# Patient Record
Sex: Female | Born: 1935 | Race: White | Hispanic: No | Smoking: Never smoker
Health system: Southern US, Community
[De-identification: ages and names within clinical notes are randomized; demographics above are authoritative.]

## PROBLEM LIST (undated history)

## (undated) DIAGNOSIS — Z8719 Personal history of other diseases of the digestive system: Secondary | ICD-10-CM

## (undated) DIAGNOSIS — J302 Other seasonal allergic rhinitis: Secondary | ICD-10-CM

## (undated) DIAGNOSIS — F32A Depression, unspecified: Secondary | ICD-10-CM

## (undated) DIAGNOSIS — I10 Essential (primary) hypertension: Secondary | ICD-10-CM

## (undated) DIAGNOSIS — C649 Malignant neoplasm of unspecified kidney, except renal pelvis: Secondary | ICD-10-CM

## (undated) DIAGNOSIS — R519 Headache, unspecified: Secondary | ICD-10-CM

## (undated) DIAGNOSIS — E119 Type 2 diabetes mellitus without complications: Secondary | ICD-10-CM

## (undated) DIAGNOSIS — M1990 Unspecified osteoarthritis, unspecified site: Secondary | ICD-10-CM

## (undated) DIAGNOSIS — E7800 Pure hypercholesterolemia, unspecified: Secondary | ICD-10-CM

## (undated) DIAGNOSIS — R51 Headache: Secondary | ICD-10-CM

## (undated) DIAGNOSIS — I669 Occlusion and stenosis of unspecified cerebral artery: Secondary | ICD-10-CM

## (undated) DIAGNOSIS — F329 Major depressive disorder, single episode, unspecified: Secondary | ICD-10-CM

## (undated) DIAGNOSIS — D649 Anemia, unspecified: Secondary | ICD-10-CM

## (undated) DIAGNOSIS — Z972 Presence of dental prosthetic device (complete) (partial): Secondary | ICD-10-CM

## (undated) DIAGNOSIS — G459 Transient cerebral ischemic attack, unspecified: Secondary | ICD-10-CM

## (undated) HISTORY — PX: BREAST CYST ASPIRATION: SHX578

## (undated) HISTORY — PX: BREAST BIOPSY: SHX20

## (undated) HISTORY — PX: BRAIN SURGERY: SHX531

## (undated) HISTORY — PX: ABDOMINAL HYSTERECTOMY: SHX81

## (undated) HISTORY — PX: APPENDECTOMY: SHX54

## (undated) HISTORY — PX: CHOLECYSTECTOMY: SHX55

---

## 1998-09-21 DIAGNOSIS — G459 Transient cerebral ischemic attack, unspecified: Secondary | ICD-10-CM

## 1998-09-21 HISTORY — DX: Transient cerebral ischemic attack, unspecified: G45.9

## 2004-10-30 ENCOUNTER — Ambulatory Visit: Payer: Self-pay | Admitting: Family Medicine

## 2005-10-13 ENCOUNTER — Emergency Department: Payer: Self-pay | Admitting: Emergency Medicine

## 2006-01-26 ENCOUNTER — Other Ambulatory Visit: Payer: Self-pay

## 2006-01-26 ENCOUNTER — Emergency Department: Payer: Self-pay | Admitting: Emergency Medicine

## 2006-11-04 ENCOUNTER — Ambulatory Visit: Payer: Self-pay | Admitting: Urology

## 2007-05-08 ENCOUNTER — Emergency Department: Payer: Self-pay | Admitting: Unknown Physician Specialty

## 2007-05-08 ENCOUNTER — Other Ambulatory Visit: Payer: Self-pay

## 2007-07-13 ENCOUNTER — Ambulatory Visit: Payer: Self-pay | Admitting: Family Medicine

## 2007-07-18 ENCOUNTER — Other Ambulatory Visit: Payer: Self-pay

## 2007-07-18 ENCOUNTER — Emergency Department: Payer: Self-pay | Admitting: Emergency Medicine

## 2007-07-27 ENCOUNTER — Ambulatory Visit: Payer: Self-pay | Admitting: Psychiatry

## 2007-09-09 ENCOUNTER — Ambulatory Visit: Payer: Self-pay | Admitting: Urology

## 2008-03-05 ENCOUNTER — Inpatient Hospital Stay: Payer: Self-pay | Admitting: Specialist

## 2008-03-05 ENCOUNTER — Other Ambulatory Visit: Payer: Self-pay

## 2008-08-01 ENCOUNTER — Ambulatory Visit: Payer: Self-pay | Admitting: Family Medicine

## 2008-10-13 ENCOUNTER — Emergency Department: Payer: Self-pay | Admitting: Emergency Medicine

## 2009-01-21 ENCOUNTER — Emergency Department: Payer: Self-pay | Admitting: Emergency Medicine

## 2009-05-14 ENCOUNTER — Ambulatory Visit: Payer: Self-pay | Admitting: Internal Medicine

## 2009-08-10 ENCOUNTER — Emergency Department: Payer: Self-pay | Admitting: Emergency Medicine

## 2009-10-10 ENCOUNTER — Emergency Department: Payer: Self-pay | Admitting: Emergency Medicine

## 2010-02-02 ENCOUNTER — Emergency Department: Payer: Self-pay | Admitting: Unknown Physician Specialty

## 2010-02-21 ENCOUNTER — Emergency Department: Payer: Self-pay | Admitting: Emergency Medicine

## 2010-06-06 DIAGNOSIS — E119 Type 2 diabetes mellitus without complications: Secondary | ICD-10-CM | POA: Insufficient documentation

## 2010-06-06 DIAGNOSIS — G63 Polyneuropathy in diseases classified elsewhere: Secondary | ICD-10-CM | POA: Insufficient documentation

## 2010-08-11 ENCOUNTER — Emergency Department: Payer: Self-pay | Admitting: Emergency Medicine

## 2010-08-21 ENCOUNTER — Ambulatory Visit: Payer: Self-pay | Admitting: Oncology

## 2010-08-22 ENCOUNTER — Ambulatory Visit: Payer: Self-pay | Admitting: Internal Medicine

## 2010-09-04 ENCOUNTER — Ambulatory Visit: Payer: Self-pay | Admitting: Urology

## 2010-09-19 ENCOUNTER — Ambulatory Visit: Payer: Self-pay | Admitting: Oncology

## 2010-09-21 ENCOUNTER — Ambulatory Visit: Payer: Self-pay | Admitting: Oncology

## 2010-12-08 ENCOUNTER — Ambulatory Visit: Payer: Self-pay | Admitting: Urology

## 2010-12-16 ENCOUNTER — Ambulatory Visit: Payer: Self-pay | Admitting: Urology

## 2011-01-10 ENCOUNTER — Emergency Department: Payer: Self-pay | Admitting: Internal Medicine

## 2011-04-24 ENCOUNTER — Ambulatory Visit: Payer: Self-pay | Admitting: Internal Medicine

## 2011-08-17 ENCOUNTER — Ambulatory Visit: Payer: Self-pay | Admitting: Internal Medicine

## 2011-11-27 ENCOUNTER — Ambulatory Visit: Payer: Self-pay | Admitting: Internal Medicine

## 2011-11-27 LAB — URINALYSIS, COMPLETE
Bacteria: NEGATIVE
Bilirubin,UR: NEGATIVE
Ketone: NEGATIVE
Ph: 6.5 (ref 4.5–8.0)
Protein: NEGATIVE
Specific Gravity: 1.01 (ref 1.003–1.030)

## 2011-11-28 ENCOUNTER — Emergency Department: Payer: Self-pay | Admitting: Internal Medicine

## 2011-11-28 LAB — URINALYSIS, COMPLETE
Bilirubin,UR: NEGATIVE
Glucose,UR: NEGATIVE mg/dL (ref 0–75)
Ketone: NEGATIVE
Nitrite: NEGATIVE
Ph: 6 (ref 4.5–8.0)
Protein: NEGATIVE
WBC UR: <1 /HPF (ref 0–5)

## 2011-11-28 LAB — CBC
HCT: 33.9 % — ABNORMAL LOW (ref 35.0–47.0)
MCH: 31.4 pg (ref 26.0–34.0)
MCHC: 33.3 g/dL (ref 32.0–36.0)
MCV: 94 fL (ref 80–100)
RBC: 3.6 10*6/uL — ABNORMAL LOW (ref 3.80–5.20)
WBC: 5.1 10*3/uL (ref 3.6–11.0)

## 2011-11-28 LAB — BASIC METABOLIC PANEL
Anion Gap: 11 (ref 7–16)
Calcium, Total: 9.2 mg/dL (ref 8.5–10.1)
Chloride: 100 mmol/L (ref 98–107)
Co2: 27 mmol/L (ref 21–32)
Creatinine: 1.15 mg/dL (ref 0.60–1.30)
Potassium: 4 mmol/L (ref 3.5–5.1)

## 2011-11-29 LAB — URINE CULTURE

## 2011-12-01 ENCOUNTER — Inpatient Hospital Stay: Payer: Self-pay | Admitting: Psychiatry

## 2011-12-01 LAB — COMPREHENSIVE METABOLIC PANEL
Alkaline Phosphatase: 67 U/L (ref 50–136)
BUN: 21 mg/dL — ABNORMAL HIGH (ref 7–18)
Bilirubin,Total: 0.3 mg/dL (ref 0.2–1.0)
Chloride: 101 mmol/L (ref 98–107)
Co2: 28 mmol/L (ref 21–32)
EGFR (African American): >60
SGPT (ALT): 17 U/L
Sodium: 138 mmol/L (ref 136–145)
Total Protein: 7.4 g/dL (ref 6.4–8.2)

## 2011-12-01 LAB — URINALYSIS, COMPLETE
Bilirubin,UR: NEGATIVE
Glucose,UR: NEGATIVE mg/dL (ref 0–75)
Hyaline Cast: 1
Ketone: NEGATIVE
Nitrite: NEGATIVE
Protein: NEGATIVE
RBC,UR: 8 /HPF (ref 0–5)
Specific Gravity: 1.017 (ref 1.003–1.030)
Squamous Epithelial: 4
WBC UR: 17 /HPF (ref 0–5)

## 2011-12-01 LAB — ETHANOL
Ethanol %: 0.003 % (ref 0.000–0.080)
Ethanol: 3 mg/dL

## 2011-12-01 LAB — CBC
HCT: 33.1 % — ABNORMAL LOW (ref 35.0–47.0)
HGB: 10.9 g/dL — ABNORMAL LOW (ref 12.0–16.0)
MCHC: 33.1 g/dL (ref 32.0–36.0)
MCV: 94 fL (ref 80–100)
RBC: 3.51 10*6/uL — ABNORMAL LOW (ref 3.80–5.20)
WBC: 4.2 10*3/uL (ref 3.6–11.0)

## 2011-12-01 LAB — DRUG SCREEN, URINE
Barbiturates, Ur Screen: NEGATIVE (ref ?–200)
Cannabinoid 50 Ng, Ur ~~LOC~~: NEGATIVE (ref ?–50)
Methadone, Ur Screen: NEGATIVE (ref ?–300)
Tricyclic, Ur Screen: NEGATIVE (ref ?–1000)

## 2011-12-01 LAB — TSH: Thyroid Stimulating Horm: 1.34 u[IU]/mL

## 2011-12-12 ENCOUNTER — Inpatient Hospital Stay: Payer: Self-pay | Admitting: Internal Medicine

## 2011-12-12 LAB — URINALYSIS, COMPLETE
Bilirubin,UR: NEGATIVE
Glucose,UR: NEGATIVE mg/dL (ref 0–75)
Ketone: NEGATIVE
Nitrite: NEGATIVE
Ph: 7 (ref 4.5–8.0)
Protein: NEGATIVE
RBC,UR: 5 /HPF (ref 0–5)
Specific Gravity: 1.009 (ref 1.003–1.030)
Squamous Epithelial: 1
WBC UR: 1 /HPF (ref 0–5)

## 2011-12-12 LAB — COMPREHENSIVE METABOLIC PANEL
Albumin: 3.5 g/dL (ref 3.4–5.0)
Alkaline Phosphatase: 70 U/L (ref 50–136)
Anion Gap: 10 (ref 7–16)
BUN: 24 mg/dL — ABNORMAL HIGH (ref 7–18)
Bilirubin,Total: 0.3 mg/dL (ref 0.2–1.0)
Chloride: 104 mmol/L (ref 98–107)
Co2: 29 mmol/L (ref 21–32)
Osmolality: 289 (ref 275–301)
SGOT(AST): 22 U/L (ref 15–37)
Total Protein: 6.8 g/dL (ref 6.4–8.2)

## 2011-12-12 LAB — CK TOTAL AND CKMB (NOT AT ARMC)
CK, Total: 172 U/L (ref 21–215)
CK-MB: 0.5 ng/mL (ref 0.5–3.6)

## 2011-12-12 LAB — CBC
MCH: 31.3 pg (ref 26.0–34.0)
MCHC: 33.2 g/dL (ref 32.0–36.0)
Platelet: 297 10*3/uL (ref 150–440)
RDW: 13.2 % (ref 11.5–14.5)
WBC: 5.2 10*3/uL (ref 3.6–11.0)

## 2011-12-12 LAB — TROPONIN I: Troponin-I: <0.02 ng/mL

## 2011-12-13 LAB — BASIC METABOLIC PANEL
Anion Gap: 9 (ref 7–16)
BUN: 23 mg/dL — ABNORMAL HIGH (ref 7–18)
Chloride: 106 mmol/L (ref 98–107)
Co2: 27 mmol/L (ref 21–32)
Creatinine: 1.08 mg/dL (ref 0.60–1.30)
EGFR (Non-African Amer.): 53 — ABNORMAL LOW
Glucose: 103 mg/dL — ABNORMAL HIGH (ref 65–99)
Osmolality: 287 (ref 275–301)
Potassium: 4.4 mmol/L (ref 3.5–5.1)
Sodium: 142 mmol/L (ref 136–145)

## 2011-12-13 LAB — TSH: Thyroid Stimulating Horm: 1.91 u[IU]/mL

## 2011-12-13 LAB — LIPID PANEL
Cholesterol: 116 mg/dL (ref 0–200)
Triglycerides: 54 mg/dL (ref 0–200)

## 2011-12-21 ENCOUNTER — Ambulatory Visit: Payer: Self-pay | Admitting: Oncology

## 2011-12-23 LAB — CBC CANCER CENTER
Basophil #: 0 x10 3/mm (ref 0.0–0.1)
Basophil %: 0.6 %
Eosinophil %: 3.2 %
Lymphocyte #: 1.3 x10 3/mm (ref 1.0–3.6)
MCH: 30.7 pg (ref 26.0–34.0)
MCV: 95.1 fL (ref 80–100)
Monocyte %: 13 %
Neutrophil #: 2.4 x10 3/mm (ref 1.4–6.5)
Neutrophil %: 53.2 %
Platelet: 332 x10 3/mm (ref 150–440)
WBC: 4.4 x10 3/mm (ref 3.6–11.0)

## 2011-12-23 LAB — IRON AND TIBC
Iron Bind.Cap.(Total): 320 ug/dL (ref 250–450)
Iron: 102 ug/dL (ref 50–170)

## 2011-12-23 LAB — URINALYSIS, COMPLETE
Bacteria: NEGATIVE
Ketone: NEGATIVE
Protein: NEGATIVE
Specific Gravity: 1.025 (ref 1.003–1.030)

## 2011-12-23 LAB — LACTATE DEHYDROGENASE: LDH: 161 U/L (ref 84–246)

## 2011-12-23 LAB — FOLATE: Folic Acid: 27.7 ng/mL (ref 3.1–100.0)

## 2011-12-23 LAB — FERRITIN: Ferritin (ARMC): 66 ng/mL (ref 8–388)

## 2011-12-23 LAB — RETICULOCYTES: Absolute Retic Count: 0.087 10*6/uL — ABNORMAL HIGH (ref 0.024–0.084)

## 2011-12-24 LAB — URINE IEP, RANDOM

## 2011-12-25 LAB — PROT IMMUNOELECTROPHORES(ARMC)

## 2012-01-13 LAB — CREATININE, SERUM
Creatinine: 1.16 mg/dL (ref 0.60–1.30)
EGFR (African American): 53 — ABNORMAL LOW
EGFR (Non-African Amer.): 46 — ABNORMAL LOW

## 2012-01-20 ENCOUNTER — Ambulatory Visit: Payer: Self-pay | Admitting: Oncology

## 2012-02-20 ENCOUNTER — Ambulatory Visit: Payer: Self-pay | Admitting: Oncology

## 2012-04-06 ENCOUNTER — Ambulatory Visit: Payer: Self-pay | Admitting: Oncology

## 2012-04-06 LAB — CBC CANCER CENTER
Basophil #: 0 x10 3/mm (ref 0.0–0.1)
Basophil %: 0.7 %
Eosinophil %: 1.9 %
HCT: 33.3 % — ABNORMAL LOW (ref 35.0–47.0)
Lymphocyte #: 1.5 x10 3/mm (ref 1.0–3.6)
Lymphocyte %: 27.2 %
Monocyte #: 0.6 x10 3/mm (ref 0.2–0.9)
Monocyte %: 10.8 %
Neutrophil #: 3.2 x10 3/mm (ref 1.4–6.5)
Platelet: 260 x10 3/mm (ref 150–440)
RBC: 3.44 10*6/uL — ABNORMAL LOW (ref 3.80–5.20)
RDW: 13.1 % (ref 11.5–14.5)
WBC: 5.4 x10 3/mm (ref 3.6–11.0)

## 2012-04-06 LAB — COMPREHENSIVE METABOLIC PANEL
Albumin: 3.5 g/dL (ref 3.4–5.0)
Alkaline Phosphatase: 96 U/L (ref 50–136)
Bilirubin,Total: 0.3 mg/dL (ref 0.2–1.0)
Calcium, Total: 8.9 mg/dL (ref 8.5–10.1)
Glucose: 118 mg/dL — ABNORMAL HIGH (ref 65–99)
SGOT(AST): 17 U/L (ref 15–37)
SGPT (ALT): 15 U/L

## 2012-04-06 LAB — IRON AND TIBC
Iron Saturation: 30 %
Iron: 90 ug/dL (ref 50–170)
Unbound Iron-Bind.Cap.: 215 ug/dL

## 2012-04-06 LAB — FERRITIN: Ferritin (ARMC): 45 ng/mL (ref 8–388)

## 2012-04-06 LAB — LIPASE, BLOOD: Lipase: 117 U/L (ref 73–393)

## 2012-04-06 LAB — AMYLASE: Amylase: 46 U/L (ref 25–115)

## 2012-04-12 ENCOUNTER — Emergency Department: Payer: Self-pay | Admitting: Emergency Medicine

## 2012-04-12 LAB — URINALYSIS, COMPLETE
Glucose,UR: NEGATIVE mg/dL (ref 0–75)
Nitrite: NEGATIVE
Ph: 7 (ref 4.5–8.0)
Protein: NEGATIVE
RBC,UR: 9 /HPF (ref 0–5)
Specific Gravity: 1.011 (ref 1.003–1.030)
Squamous Epithelial: 1

## 2012-04-12 LAB — COMPREHENSIVE METABOLIC PANEL
Albumin: 3.6 g/dL (ref 3.4–5.0)
Alkaline Phosphatase: 86 U/L (ref 50–136)
Anion Gap: 8 (ref 7–16)
Bilirubin,Total: 0.4 mg/dL (ref 0.2–1.0)
Calcium, Total: 8.7 mg/dL (ref 8.5–10.1)
Glucose: 79 mg/dL (ref 65–99)
Potassium: 4.1 mmol/L (ref 3.5–5.1)
SGOT(AST): 20 U/L (ref 15–37)
SGPT (ALT): 11 U/L — ABNORMAL LOW
Sodium: 139 mmol/L (ref 136–145)

## 2012-04-12 LAB — CBC
HGB: 10.4 g/dL — ABNORMAL LOW (ref 12.0–16.0)
MCHC: 33 g/dL (ref 32.0–36.0)
Platelet: 236 10*3/uL (ref 150–440)
RBC: 3.29 10*6/uL — ABNORMAL LOW (ref 3.80–5.20)
WBC: 4.1 10*3/uL (ref 3.6–11.0)

## 2012-04-21 ENCOUNTER — Ambulatory Visit: Payer: Self-pay | Admitting: Oncology

## 2012-04-28 ENCOUNTER — Ambulatory Visit: Payer: Self-pay | Admitting: Unknown Physician Specialty

## 2012-05-20 ENCOUNTER — Ambulatory Visit: Payer: Self-pay | Admitting: Unknown Physician Specialty

## 2012-05-24 LAB — PATHOLOGY REPORT

## 2012-05-25 ENCOUNTER — Ambulatory Visit: Payer: Self-pay | Admitting: Oncology

## 2012-05-25 LAB — CBC CANCER CENTER
Basophil #: 0.1 x10 3/mm (ref 0.0–0.1)
Basophil %: 1.4 %
Eosinophil %: 3.2 %
HCT: 35 % (ref 35.0–47.0)
HGB: 11.6 g/dL — ABNORMAL LOW (ref 12.0–16.0)
Lymphocyte #: 1.4 x10 3/mm (ref 1.0–3.6)
MCH: 32 pg (ref 26.0–34.0)
MCV: 97 fL (ref 80–100)
Monocyte #: 0.5 x10 3/mm (ref 0.2–0.9)
Monocyte %: 11.6 %
Neutrophil #: 2.5 x10 3/mm (ref 1.4–6.5)
Platelet: 282 x10 3/mm (ref 150–440)
RDW: 13.3 % (ref 11.5–14.5)
WBC: 4.6 x10 3/mm (ref 3.6–11.0)

## 2012-06-21 ENCOUNTER — Ambulatory Visit: Payer: Self-pay | Admitting: Oncology

## 2012-11-09 ENCOUNTER — Emergency Department: Payer: Self-pay | Admitting: Unknown Physician Specialty

## 2012-11-10 ENCOUNTER — Ambulatory Visit: Payer: Self-pay | Admitting: Internal Medicine

## 2012-11-16 ENCOUNTER — Ambulatory Visit: Payer: Self-pay | Admitting: Internal Medicine

## 2013-01-26 ENCOUNTER — Ambulatory Visit: Payer: Self-pay

## 2013-01-26 LAB — URINALYSIS, COMPLETE
Bilirubin,UR: NEGATIVE
Ketone: NEGATIVE
Protein: NEGATIVE
Specific Gravity: 1.015 (ref 1.003–1.030)

## 2013-01-28 LAB — URINE CULTURE

## 2013-02-14 ENCOUNTER — Ambulatory Visit: Payer: Self-pay | Admitting: Surgery

## 2013-02-20 DIAGNOSIS — R310 Gross hematuria: Secondary | ICD-10-CM | POA: Insufficient documentation

## 2013-02-20 DIAGNOSIS — N907 Vulvar cyst: Secondary | ICD-10-CM | POA: Insufficient documentation

## 2013-02-20 DIAGNOSIS — R300 Dysuria: Secondary | ICD-10-CM | POA: Insufficient documentation

## 2013-02-20 DIAGNOSIS — N3941 Urge incontinence: Secondary | ICD-10-CM | POA: Insufficient documentation

## 2013-02-20 DIAGNOSIS — R3989 Other symptoms and signs involving the genitourinary system: Secondary | ICD-10-CM | POA: Insufficient documentation

## 2013-02-20 DIAGNOSIS — C649 Malignant neoplasm of unspecified kidney, except renal pelvis: Secondary | ICD-10-CM | POA: Insufficient documentation

## 2013-02-21 DIAGNOSIS — G723 Periodic paralysis: Secondary | ICD-10-CM | POA: Insufficient documentation

## 2013-02-21 DIAGNOSIS — R5381 Other malaise: Secondary | ICD-10-CM | POA: Insufficient documentation

## 2013-02-21 DIAGNOSIS — W19XXXA Unspecified fall, initial encounter: Secondary | ICD-10-CM | POA: Insufficient documentation

## 2013-03-21 ENCOUNTER — Ambulatory Visit: Payer: Self-pay | Admitting: Surgery

## 2013-03-21 HISTORY — PX: BREAST BIOPSY: SHX20

## 2013-04-19 ENCOUNTER — Observation Stay: Payer: Self-pay | Admitting: Internal Medicine

## 2013-04-19 LAB — CBC
HCT: 33.2 % — ABNORMAL LOW (ref 35.0–47.0)
MCH: 32.7 pg (ref 26.0–34.0)
MCHC: 34.6 g/dL (ref 32.0–36.0)
MCV: 95 fL (ref 80–100)
Platelet: 257 10*3/uL (ref 150–440)
RBC: 3.52 10*6/uL — ABNORMAL LOW (ref 3.80–5.20)
RDW: 12.7 % (ref 11.5–14.5)
WBC: 4.6 10*3/uL (ref 3.6–11.0)

## 2013-04-19 LAB — BASIC METABOLIC PANEL
Anion Gap: 6 — ABNORMAL LOW (ref 7–16)
BUN: 19 mg/dL — ABNORMAL HIGH (ref 7–18)
Calcium, Total: 9.4 mg/dL (ref 8.5–10.1)
Chloride: 101 mmol/L (ref 98–107)
Creatinine: 1.14 mg/dL (ref 0.60–1.30)
EGFR (African American): 54 — ABNORMAL LOW
Sodium: 136 mmol/L (ref 136–145)

## 2013-04-19 LAB — TROPONIN I
Troponin-I: <0.02 ng/mL
Troponin-I: <0.02 ng/mL

## 2013-04-19 LAB — CK TOTAL AND CKMB (NOT AT ARMC): CK, Total: 157 U/L (ref 21–215)

## 2013-04-20 LAB — LIPID PANEL
Cholesterol: 122 mg/dL (ref 0–200)
HDL Cholesterol: 66 mg/dL — ABNORMAL HIGH (ref 40–60)
Triglycerides: 19 mg/dL (ref 0–200)
VLDL Cholesterol, Calc: 4 mg/dL — ABNORMAL LOW (ref 5–40)

## 2013-04-20 LAB — BASIC METABOLIC PANEL
Anion Gap: 4 — ABNORMAL LOW (ref 7–16)
Co2: 29 mmol/L (ref 21–32)
EGFR (African American): 59 — ABNORMAL LOW
EGFR (Non-African Amer.): 51 — ABNORMAL LOW
Glucose: 177 mg/dL — ABNORMAL HIGH (ref 65–99)
Sodium: 137 mmol/L (ref 136–145)

## 2013-04-20 LAB — CBC WITH DIFFERENTIAL/PLATELET
Basophil #: 0 10*3/uL (ref 0.0–0.1)
Eosinophil #: 0 10*3/uL (ref 0.0–0.7)
HCT: 30.3 % — ABNORMAL LOW (ref 35.0–47.0)
Lymphocyte #: 0.6 10*3/uL — ABNORMAL LOW (ref 1.0–3.6)
MCV: 95 fL (ref 80–100)
Monocyte #: 0.1 x10 3/mm — ABNORMAL LOW (ref 0.2–0.9)
Neutrophil #: 5.6 10*3/uL (ref 1.4–6.5)
Platelet: 224 10*3/uL (ref 150–440)
RBC: 3.19 10*6/uL — ABNORMAL LOW (ref 3.80–5.20)
RDW: 12.5 % (ref 11.5–14.5)

## 2013-04-20 LAB — TROPONIN I: Troponin-I: <0.02 ng/mL

## 2013-07-26 ENCOUNTER — Ambulatory Visit: Payer: Self-pay | Admitting: Internal Medicine

## 2013-10-28 ENCOUNTER — Emergency Department: Payer: Self-pay | Admitting: Emergency Medicine

## 2013-11-23 ENCOUNTER — Ambulatory Visit: Payer: Self-pay | Admitting: Internal Medicine

## 2014-02-06 DIAGNOSIS — F329 Major depressive disorder, single episode, unspecified: Secondary | ICD-10-CM | POA: Insufficient documentation

## 2014-02-06 DIAGNOSIS — N1832 Chronic kidney disease, stage 3b: Secondary | ICD-10-CM | POA: Insufficient documentation

## 2014-02-06 DIAGNOSIS — N183 Chronic kidney disease, stage 3 unspecified: Secondary | ICD-10-CM | POA: Insufficient documentation

## 2014-02-06 DIAGNOSIS — E7800 Pure hypercholesterolemia, unspecified: Secondary | ICD-10-CM | POA: Insufficient documentation

## 2014-02-06 DIAGNOSIS — E538 Deficiency of other specified B group vitamins: Secondary | ICD-10-CM | POA: Insufficient documentation

## 2014-02-06 DIAGNOSIS — F418 Other specified anxiety disorders: Secondary | ICD-10-CM | POA: Insufficient documentation

## 2014-02-06 DIAGNOSIS — F419 Anxiety disorder, unspecified: Secondary | ICD-10-CM

## 2014-04-29 DIAGNOSIS — D638 Anemia in other chronic diseases classified elsewhere: Secondary | ICD-10-CM | POA: Insufficient documentation

## 2014-04-29 DIAGNOSIS — I10 Essential (primary) hypertension: Secondary | ICD-10-CM | POA: Insufficient documentation

## 2014-04-29 DIAGNOSIS — D649 Anemia, unspecified: Secondary | ICD-10-CM | POA: Insufficient documentation

## 2014-04-30 DIAGNOSIS — Z87440 Personal history of urinary (tract) infections: Secondary | ICD-10-CM | POA: Insufficient documentation

## 2014-04-30 DIAGNOSIS — Z87718 Personal history of other specified (corrected) congenital malformations of genitourinary system: Secondary | ICD-10-CM | POA: Insufficient documentation

## 2014-04-30 DIAGNOSIS — Z87448 Personal history of other diseases of urinary system: Secondary | ICD-10-CM | POA: Insufficient documentation

## 2014-05-02 ENCOUNTER — Emergency Department: Payer: Self-pay | Admitting: Emergency Medicine

## 2014-05-02 LAB — BASIC METABOLIC PANEL
Anion Gap: 7 (ref 7–16)
BUN: 20 mg/dL — AB (ref 7–18)
CHLORIDE: 104 mmol/L (ref 98–107)
CO2: 27 mmol/L (ref 21–32)
CREATININE: 1.13 mg/dL (ref 0.60–1.30)
Calcium, Total: 8.7 mg/dL (ref 8.5–10.1)
EGFR (African American): 54 — ABNORMAL LOW
EGFR (Non-African Amer.): 47 — ABNORMAL LOW
Glucose: 118 mg/dL — ABNORMAL HIGH (ref 65–99)
OSMOLALITY: 279 (ref 275–301)
Potassium: 4 mmol/L (ref 3.5–5.1)
Sodium: 138 mmol/L (ref 136–145)

## 2014-05-02 LAB — URINALYSIS, COMPLETE
Bacteria: NONE SEEN
Bilirubin,UR: NEGATIVE
Glucose,UR: NEGATIVE mg/dL (ref 0–75)
Ketone: NEGATIVE
Nitrite: NEGATIVE
PH: 7 (ref 4.5–8.0)
Protein: NEGATIVE
SPECIFIC GRAVITY: 1.009 (ref 1.003–1.030)

## 2014-05-02 LAB — CBC WITH DIFFERENTIAL/PLATELET
Basophil #: 0.1 10*3/uL (ref 0.0–0.1)
Basophil %: 1.3 %
EOS PCT: 6.2 %
Eosinophil #: 0.3 10*3/uL (ref 0.0–0.7)
HCT: 32.4 % — ABNORMAL LOW (ref 35.0–47.0)
HGB: 10.8 g/dL — AB (ref 12.0–16.0)
LYMPHS ABS: 1.5 10*3/uL (ref 1.0–3.6)
Lymphocyte %: 33.9 %
MCH: 31.7 pg (ref 26.0–34.0)
MCHC: 33.3 g/dL (ref 32.0–36.0)
MCV: 95 fL (ref 80–100)
MONO ABS: 0.7 x10 3/mm (ref 0.2–0.9)
MONOS PCT: 15 %
NEUTROS PCT: 43.6 %
Neutrophil #: 2 10*3/uL (ref 1.4–6.5)
Platelet: 273 10*3/uL (ref 150–440)
RBC: 3.4 10*6/uL — AB (ref 3.80–5.20)
RDW: 12.8 % (ref 11.5–14.5)
WBC: 4.5 10*3/uL (ref 3.6–11.0)

## 2014-05-02 LAB — TROPONIN I: Troponin-I: <0.02 ng/mL

## 2014-05-07 ENCOUNTER — Emergency Department: Payer: Self-pay | Admitting: Emergency Medicine

## 2014-05-07 LAB — CBC
HCT: 35.1 % (ref 35.0–47.0)
HGB: 11.2 g/dL — ABNORMAL LOW (ref 12.0–16.0)
MCH: 30.7 pg (ref 26.0–34.0)
MCHC: 31.8 g/dL — ABNORMAL LOW (ref 32.0–36.0)
MCV: 97 fL (ref 80–100)
Platelet: 300 10*3/uL (ref 150–440)
RBC: 3.63 10*6/uL — ABNORMAL LOW (ref 3.80–5.20)
RDW: 13.1 % (ref 11.5–14.5)
WBC: 4.9 10*3/uL (ref 3.6–11.0)

## 2014-05-07 LAB — DRUG SCREEN, URINE
Amphetamines, Ur Screen: NEGATIVE (ref ?–1000)
BARBITURATES, UR SCREEN: NEGATIVE (ref ?–200)
Benzodiazepine, Ur Scrn: NEGATIVE (ref ?–200)
Cannabinoid 50 Ng, Ur ~~LOC~~: NEGATIVE (ref ?–50)
Cocaine Metabolite,Ur ~~LOC~~: NEGATIVE (ref ?–300)
MDMA (Ecstasy)Ur Screen: POSITIVE (ref ?–500)
Methadone, Ur Screen: NEGATIVE (ref ?–300)
Opiate, Ur Screen: NEGATIVE (ref ?–300)
PHENCYCLIDINE (PCP) UR S: NEGATIVE (ref ?–25)
TRICYCLIC, UR SCREEN: NEGATIVE (ref ?–1000)

## 2014-05-07 LAB — URINALYSIS, COMPLETE
BLOOD: NEGATIVE
Bacteria: NONE SEEN
Bilirubin,UR: NEGATIVE
Glucose,UR: NEGATIVE mg/dL (ref 0–75)
Ketone: NEGATIVE
LEUKOCYTE ESTERASE: NEGATIVE
Nitrite: NEGATIVE
PH: 7 (ref 4.5–8.0)
Protein: NEGATIVE
RBC,UR: 10 /HPF (ref 0–5)
SPECIFIC GRAVITY: 1.014 (ref 1.003–1.030)

## 2014-05-07 LAB — COMPREHENSIVE METABOLIC PANEL
ALBUMIN: 3.5 g/dL (ref 3.4–5.0)
Alkaline Phosphatase: 69 U/L
Anion Gap: 9 (ref 7–16)
BUN: 19 mg/dL — ABNORMAL HIGH (ref 7–18)
Bilirubin,Total: 0.4 mg/dL (ref 0.2–1.0)
CALCIUM: 9.1 mg/dL (ref 8.5–10.1)
CO2: 27 mmol/L (ref 21–32)
Chloride: 102 mmol/L (ref 98–107)
Creatinine: 1.15 mg/dL (ref 0.60–1.30)
EGFR (Non-African Amer.): 46 — ABNORMAL LOW
GFR CALC AF AMER: 53 — AB
Glucose: 159 mg/dL — ABNORMAL HIGH (ref 65–99)
OSMOLALITY: 281 (ref 275–301)
POTASSIUM: 4.3 mmol/L (ref 3.5–5.1)
SGOT(AST): 25 U/L (ref 15–37)
SGPT (ALT): 13 U/L — ABNORMAL LOW
SODIUM: 138 mmol/L (ref 136–145)
TOTAL PROTEIN: 7.1 g/dL (ref 6.4–8.2)

## 2014-05-07 LAB — ACETAMINOPHEN LEVEL

## 2014-05-07 LAB — ETHANOL: Ethanol %: <0.003 % (ref 0.000–0.080)

## 2014-05-07 LAB — SALICYLATE LEVEL

## 2014-05-08 DIAGNOSIS — F332 Major depressive disorder, recurrent severe without psychotic features: Secondary | ICD-10-CM | POA: Insufficient documentation

## 2014-06-15 DIAGNOSIS — N939 Abnormal uterine and vaginal bleeding, unspecified: Secondary | ICD-10-CM | POA: Insufficient documentation

## 2014-10-31 DIAGNOSIS — F09 Unspecified mental disorder due to known physiological condition: Secondary | ICD-10-CM | POA: Insufficient documentation

## 2014-11-14 ENCOUNTER — Emergency Department: Payer: Self-pay | Admitting: Emergency Medicine

## 2015-01-11 NOTE — Discharge Summary (Signed)
PATIENT NAME:  Lori Gilmore, Lori Gilmore MR#:  [id number] DATE OF BIRTH:  03-19-1934  DATE OF ADMISSION:  04/19/2013 DATE OF DISCHARGE:  04/20/2013  DISPOSITION:  Discharge pending stress results.  PRIMARY CARE PHYSICIAN:  Ezequiel Kayser, MD  CHIEF COMPLAINT: Shortness of breath.  DISCHARGE DIAGNOSIS:  1.  Shortness of breath secondary to acute bronchitis. 2.  Chest pain, likely due to anxiety.   OTHER DIAGNOSES:  Includes: 1.  Hyperlipidemia. 2.  Anxiety and depression. 3.  Hypertension.   DISCHARGE MEDICATIONS: 1.  Atorvastatin 10 mg p.o. daily. 2.  Aspirin 81 mg daily. 3.  Wellbutrin 150 mg p.o. daily. 4.  Augmentin 875/125 mg p.o. b.i.d. for 10 days.  5.  HCTZ/lisinopril 12.5/20 mg p.o. daily. 6.  Prednisone 20 mg 2 tablets for 2 days, 1 tablet for 2 days then stop. 7.  Pro Air HFA 2 puffs every 4 hours as needed for wheezing and cough.  8.  Flonase 110 mcg 1 puff b.i.d.   CONSULTATIONS: None.   HOSPITAL COURSE:  41.  A 81 year old female patient with history of anxiety, anemia, diabetes, hyperlipidemia and hypertension who came in because of shortness of breath and cough. The patient also felt some left-sided chest pain. The patient was very anxious when she came yesterday because 2 years ago the same day her daughter who was 45 years old was shot and killed. The patient when she came she was admitted to telemetry and observation status for shortness of breath and chest pain. The patient's troponins were negative. CT chest did not show any pulmonary emboli. The patient was started on Solu-Medrol nebulizers because of her wheezing. The patient had a nuclear stress test; results are pending. The patient's shortness of breath and chest pain is likely secondary to bronchitis and also due to anxiety. The patient feels much better today. Still has some cough but lungs are clear, not hypoxic. A gave prescriptions for tapering course of prednisone, DuoNeb and Augmentin. Regarding chest pain, she had  a stress test, and if it is negative, she will be going home.  2.  Diabetes. The patient is not on medication. She says diet controlled. Sugars are around 180.  Hemoglobin A1c results are pending.  3.  Hypertension. Controlled with lisinopril with HCTZ.  4.  Hyperlipidemia. On statins. LDL is at goal.  The patient's LDL is 52.  5.  Regarding CT chest, the patient's CT chest showed no PE, but it showed a 3 mm pulmonary nodule on the right lower lobe.  The advised about that. I asked her to follow up with her PMD regarding repeat scheduled CT of the chest.  6.  She also had abnormality in the splenic flexure of the colon with thickening, but she did have colonoscopy last year with Dr. Vira Agar, and it showed internal hemorrhoids and also adhesions from previous surgery in the colon.  We are not going to repeat another colonoscopy.  The patient can follow up with her primary doctor   [place]:  More than 30 minutes.  ____________________________ Epifanio Lesches, MD sk:sb D: 04/20/2013 12:59:01 ET T: 04/20/2013 13:29:47 ET JOB#: [id number]  cc: Epifanio Lesches, MD, <Dictator> Epifanio Lesches MD ELECTRONICALLY SIGNED 05/03/2013 16:28

## 2015-01-11 NOTE — H&P (Signed)
PATIENT NAME:  Lori Gilmore, Lori Gilmore MR#:  [id number] DATE OF BIRTH:  Sep 06, 1934  DATE OF ADMISSION:  04/19/2013  ADMITTING PHYSICIAN: Loletha Grayer, MD   PRIMARY CARE PHYSICIAN: Christena Flake. Raechel Ache, MD    CHIEF COMPLAINT: Shortness of breath.   HISTORY OF PRESENT ILLNESS: This is a 81 year old female who has been having some mild shortness of breath over the last 3 or 4 days, worsened this morning at 11:00 a.m. and felt that she could not breathe. She feels about the same here in the hospital that she cannot breathe.  This week, she did have a little bit of a cough but no wheeze. She has been using her husband's inhaler. He does smoke in the home and this irritates her. She also feels that her heart skips a beat and it radiates up into her ear.  This a.m., also had brief chest pain lasting 5 minutes in the left side of her chest, a very light pain, not severe at all, and went away on its own. No nausea. No diaphoresis. In the ER, a troponin was negative. Chest x-ray was negative, and hospitalist services were contacted for further evaluation. The patient also stated that 56 years ago to the day her daughter, who was 59 years old, was shot and killed.   PAST MEDICAL HISTORY: Diabetes, anemia, hyperlipidemia, anxiety and depression, hypertension, history of subdural hematoma, status post evacuation.   PAST SURGICAL HISTORY: Hysterectomy, appendix, 4 breast biopsies and subdural hematoma evacuation.   ALLERGIES: ERYTHROMYCIN, GABAPENTIN, HEPARIN, NUCYNTA AND ORAMYCIN.  MEDICATIONS: As per Prescription Writer include: aspirin 81 mg, atorvastatin 10 mg, Wellbutrin extended-release 150 mg daily, calcium carbonate 500 mg twice a day, Lexapro 20 mg daily, hydrochlorothiazide/lisinopril 12.5/20, 1 tablet daily, vitamin B12 500 mcg daily.   SOCIAL HISTORY: No smoking but does have secondhand smoke from her husband. No alcohol. No drug use. Used to work as a [employer], and she also worked in  the [employer].   FAMILY HISTORY: Father died of brain aneurysm. Mother died at 13 of a CVA.   REVIEW OF SYSTEMS:   CONSTITUTIONAL: Positive for weight loss, 3 pounds. Positive for weakness. No fever, chills, or sweats.  EYES: She does wear glasses and does have cataract, especially on the right eye, and she does have a right eye conjunctival hemorrhage that she saw her eye doctor for.  EARS, NOSE, MOUTH AND THROAT: Positive for ringing in the ears. No sore throat. No difficulty swallowing.  CARDIOVASCULAR: Positive for chest pain this morning.  RESPIRATORY: Positive for shortness of breath, slight cough. No hemoptysis.  GASTROINTESTINAL: Positive for nausea yesterday. No vomiting. No abdominal pain. No diarrhea. No constipation. No bright red blood per rectum. No melena.  GENITOURINARY: History of blood in the urine. No burning on urination.  MUSCULOSKELETAL: No joint pain or muscle pain.  INTEGUMENT: No rashes or eruptions.  NEUROLOGIC: No fainting or blackouts.  PSYCHIATRIC: Positive for anxiety and depression.  ENDOCRINE: No thyroid problems.  HEMATOLOGIC/LYMPHATIC: History of anemia.   PHYSICAL EXAMINATION: VITAL SIGNS: Temperature 97.9, pulse 69, respirations listed as 27, blood pressure 159/76, pulse oximetry 99% on room air. My respiration by me is 18.  GENERAL: No respiratory distress.  HEENT: Eyes: Conjunctiva on the right has a conjunctival hemorrhage, on the left no hemorrhage. Pupils are equal, round, and reactive to light. Extraocular muscles are intact. No nystagmus. Ears, nose, mouth, and throat: Tympanic membranes no erythema. Nasal mucosa no erythema. Throat no erythema, no exudate seen.  Lips and gums no lesions.  NECK: No JVD. No bruits. No lymphadenopathy. No thyromegaly. No thyroid nodules palpated.  RESPIRATORY: Slight expiratory wheeze right upper lung field. No use of accessory muscles to breathe. No rhonchi or rales heard.  CARDIOVASCULAR: S1, S2 normal, +1/5  systolic ejection murmur. Carotid upstroke 2+ bilaterally. No bruits.  EXTREMITIES: Dorsalis pedis pulses 2+ bilaterally. No edema of the lower extremity.  ABDOMEN: Soft, nontender. No organomegaly/splenomegaly. Normoactive bowel sounds. No masses felt.   LYMPHATIC: No lymph nodes in the neck.  MUSCULOSKELETAL: No clubbing, edema or cyanosis.  SKIN: No ulcers or lesions seen. There is a bruise under the left knee.  NEUROLOGIC: Cranial nerves II through XII grossly intact. Deep tendon reflexes 2+ bilateral lower extremities.  PSYCHIATRIC: The patient is oriented to person, place and time.   LABORATORY AND RADIOLOGICAL DATA:  White blood cell count 4.6, H and H 11.5 and 33.2, platelet count of 257. Glucose 92, BUN 19, creatinine 1.14, sodium 136, potassium 4.5, chloride 101, CO2 29, calcium 9.4.  Chest x-ray shows no acute cardiopulmonary disease. Cardiac enzymes negative.  EKG shows a normal sinus rhythm, 62 beats per minute, left axis deviation.   ASSESSMENT AND PLAN: 1.  Shortness of breath and chest pain: We will admit as an observation. I will get a CT scan of the chest to rule out pulmonary embolism. If this is negative, I will get a stress test in the a.m., and serial cardiac enzymes and put on telemetry as an observation. This could also be a bronchospasm from secondhand smoke. I will start a Flovent inhaler, DuoNeb and low-dose Solu-Medrol to see if that helps. This could also be a stress reaction because 46 years ago, to the day, her daughter was killed with a gunshot. She was age 84.  2.  Diabetes:  She is not on any medications for this. Sugar here in the ER is good. I will just put on sliding scale for right now.  3.  Hypertension:  Continue lisinopril, hydrochlorothiazide and monitor.  4.  Hyperlipidemia: On atorvastatin. Will check a lipid profile in the a.m.  5.  Anxiety and depression: Continue Wellbutrin and Lexapro.  6.  Looks like chronic kidney disease, stage 3:  I will give IV  fluid hydration now with the CAT scan.  7.  Anemia.  8.  Right conjunctival hemorrhage:  No treatment for this, just observation.   TIME SPENT ON ADMISSION: 55 minutes. The patient will be admitted as an observation.  ____________________________ Tana Conch. Leslye Peer, MD rjw:cb D: 04/19/2013 17:00:54 ET T: 04/19/2013 17:47:41 ET JOB#: [id number] cc: Tana Conch. Leslye Peer, MD, <Dictator> Christena Flake. Raechel Ache, MD Marisue Brooklyn MD ELECTRONICALLY SIGNED 05/01/2013 11:52

## 2015-01-12 NOTE — Consult Note (Signed)
PATIENT NAME:  Lori Gilmore, CORNACCHIA MR#:  [id number] DATE OF BIRTH:  07/08/1934  DATE OF CONSULTATION:  05/08/2014  REFERRING PHYSICIAN:  Delman Kitten, MD. CONSULTING PHYSICIAN:  Cordelia Pen. Gretel Acre, MD  REASON FOR CONSULT: "I told my husband to call Dr. Bridgett Larsson. I cannot take it anymore."   HISTORY OF PRESENT ILLNESS: The patient is 81 year old married white female who presented to the ER after telling her husband of 60 years that she cannot take it anymore. The patient reported that she has been seeing Dr. Bridgett Larsson every 3 months and has been on different depression medications. However, she feels that nothing is helping her. She could not identify the current stressor. However, she feels that she is getting worse and her husband has been fussing at her all the time. She cannot do anything right. She feels very depressed and anxious with constant hand wringing motions. The patient feels hopeless, helpless, and stated that she has severe anxiety at this time. The patient advised her husband to call Dr. Bridgett Larsson office as she has been feeling suicidal with thoughts of taking a knife and cutting her wrist.   During my interview, the patient reported that her husband has a lot of anger and she is trying to control herself. She stated that she does not want to go to [place], as it is too far and her  husband will not be able to drive over there. However, she reported that she has been taking her medications as prescribed and she takes pills in the morning and in the evening. She stated that she is embarrassed to say that her relationship with her husband is poor. The patient feels hopeless most of the time and she is trying to control her issues at this time. She was minimizing her depression and anxiety during my interview.   PAST PSYCHIATRIC HISTORY: The patient has a history of multiple psychiatric hospitalizations in the past. She reported that she was admitted to different area hospitals including at [facility], and has been  taking multiple psychotropic medications. She is currently under the care of Dr. Bridgett Larsson and reported that she likes Dr. Bridgett Larsson very much.   CURRENT MEDICATIONS: Lexapro 20 mg daily, bupropion 150 mg daily, aspirin 81 mg daily, atorvastatin 10 mg daily,   multivitamin once a day, calcium 500 plus D once daily, hydrochlorothiazide/lisinopril 12.2/220 mg p.o. daily.  MEDICAL CONDITIONS: Hypertension and hypothyroidism.   ALLERGIES: HEPARIN, ERYTHROMYCIN   GABAPENTIN.   SOCIAL HISTORY: The patient is currently married for the past 65 years and lives with her husband. She has fair relationship with him. She does not have any history of sexual or physical abuse.   FAMILY HISTORY OF PSYCHIATRIC ILLNESS: Unknown.   VITAL SIGNS: Temperature 98.4, pulse 64, respirations 18, blood pressure 146/56.  LABORATORY DATA: Glucose 159, BUN 19, creatinine 1.15, sodium 138, potassium 4.3, chloride 102, bicarbonate 27, anion gap 9, osmolality 281, calcium 9.1. Blood alcohol less than 3. Protein 7.1, albumin 2.5, bilirubin 0.4, alkaline phosphatase 69, AST 25, ALT 13. UDS is negative.  WBC 4.9, RBC 3.50, hemoglobin 11.2, hematocrit 35.1, platelet count 200,000.  MCV is 97 and RDW is 13.1.   REVIEW OF SYSTEMS:  CONSTITUTIONAL: No fever or chills. No weight changes.  EYES: No double or blurred vision.  RESPIRATORY: No shortness of breath or cough.  CARDIOVASCULAR: No chest pain or orthopnea.  GASTROINTESTINAL: No abdominal pain, nausea or diarrhea.  GENITOURINARY: No incontinence or frequency.  ENDOCRINE: No heat or cold intolerance.  LYMPHATIC:  No anemia or easy bruising.  MUSCULOSKELETAL: No muscle or joint pain.  NEUROLOGIC: No tingling or weakness.   MENTAL STATUS EXAMINATION: The patient is a moderately built female who appeared her stated age. She was somewhat anxious during the interview. Her gait and station appears normal. Speech was low in tone and volume. Mood was depressed. Affect was congruent. No loose  associations are noted. Thought process was without any perceptual disturbances. She was having suicidal ideations or plan to cut her wrists. Her insight and judgment were fair. She was awake, alert and oriented x 3. Her recent and remote memory were intact. Her attention span and concentration were normal. Mood was depressed. Affect was blunted.   DIAGNOSTIC IMPRESSION:  AXIS I: Bipolar disorder, severe with her depressive symptoms.   AXIS II: None reported.  AXIS III: Please review the medical history.   TREATMENT PLAN:  1.  The patient is currently on involuntary commitment and will be transferred to [facility] as they have a bed available.  2.  She will continue her medication as prescribed, and they will be adjusted at [facility].  Her family will be notified of her transfer.   Thank you for allowing me to participate in the care of this patient.    ____________________________ Cordelia Pen. Gretel Acre, MD usf:TT D: 05/08/2014 16:08:20 ET T: 05/08/2014 16:50:51 ET JOB#: [id number]  cc: Cordelia Pen. Gretel Acre, MD, <Dictator> Jeronimo Norma MD ELECTRONICALLY SIGNED 05/17/2014 15:46

## 2015-01-13 NOTE — H&P (Signed)
PATIENT NAME:  Lori Gilmore, Lori Gilmore MR#:  [id number] DATE OF BIRTH:  [birth year]  DATE OF ADMISSION:  12/01/2011  REFERRING PHYSICIAN: Dr. Ulis Rias ATTENDING PHYSICIAN: Orson Slick, M.D.   IDENTIFYING DATA: Ms. Cottam is a 81 year old female with history of depression.   CHIEF COMPLAINT: "I cannot go on."   HISTORY OF PRESENT ILLNESS: Ms. Boghosian was seen at her primary doctor's office earlier today. She disclosed information about worsening of depression that was accompanied by severe weight loss and she was sent to see Dr. Bridgett Larsson who is her primary psychiatrist. She does not see Dr. Bridgett Larsson more than every 3 to 6 months and did not make an appointment with Dr. Bridgett Larsson during the time when her depression was worsening therefore no medication adjustments were made. The patient reports that recently she experienced several health problems including blood clot in her several years ago as well as fractured shoulder four months ago. She noticed that it is increasingly difficult for her to do her chores. She has not been sleeping, her appetite is poor and she lost according to initial information 120 pounds in three months. In Dr. Lianne Moris note there is information that she lost 10 pounds in the past one month. She is crying. She feels guilty, hopeless, worthless. She is irritable. 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 The patient denies suicidal ideations or plan but feels that she is unable to cope any longer. There are guns in the house but the patient never considered using a gun. She is as a strong Christian woman.   PAST PSYCHIATRIC HISTORY: Her depression started in the 51s after her daughter who was five was shot by an 63 year old. The patient was unable to provide any details. She is too  emotional, she seems angry still. She did not have any opportunity to address this in therapy. She has never been hospitalized. No suicide attempts. No alcohol or substance abuse problem. She has been a patient of Dr. Bridgett Larsson for a while. She remembers being on Effexor. She believes that she has been on Lexapro for at least three years now. No medication changes have been made in the past three years.    FAMILY PSYCHIATRIC HISTORY: Her mother suffered depression. She thinks that her older sister is also depressed.   PAST MEDICAL HISTORY:  1. Hypertension. 2. Diabetes, diet controlled.   ALLERGIES: Erythromycin, heparin, Nucynta, oramycin.    MEDICATIONS ON ADMISSION:  1. Atorvastatin 10 mg daily.  2. Vitamin B12 500 mcg daily.  3. Lexapro 20 mg daily.  4. Neurontin 300 mg at bedtime. 5. Hydrochlorothiazide 25 mg daily.  6. Lisinopril 40 mg daily.   REVIEW OF SYSTEMS: CONSTITUTIONAL: No fevers or chills. Positive for fatigue and substantial unintended weight loss. EYES: No double or blurred vision. ENT: No hearing loss. RESPIRATORY: No shortness of breath or cough. CARDIOVASCULAR: No chest pain or orthopnea. GASTROINTESTINAL: No abdominal pain, nausea, vomiting, or diarrhea. GENITOURINARY: No incontinence or frequency. ENDOCRINE: No heat or cold intolerance. LYMPHATIC: No anemia or easy bruising. INTEGUMENTARY: No acne or rash. MUSCULOSKELETAL: No muscle or joint pain. NEUROLOGIC: Positive for neuropathy that is helped with Neurontin. PSYCHIATRIC: See history of present illness for details.   SOCIAL HISTORY: The patient has been married for 58 years. She has three living children, two of her  daughters live in the area and visit with her quite frequently. One is a preacher's wife in [place] and doesn't show up. She has ten grandchildren and some great-grandchildren she. 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 She is currently retired. Her  husband has heart problems and even doctor's visits feel expensive to them.   PHYSICAL EXAMINATION:  VITAL SIGNS: Blood pressure 152/68, pulse 82, respirations 18, temperature 98.4.   GENERAL: This is a slender older woman in no acute distress.   HEENT: The pupils are equal, round, and reactive to light. Sclera anicteric.   NECK: Supple. No thyromegaly.   LUNGS: Clear to auscultation. No dullness to percussion.   HEART: Regular rhythm and rate. No murmurs, rubs, or gallops.   ABDOMEN: Soft, nontender, nondistended.   MUSCULOSKELETAL: Normal muscle strength in all extremities.   SKIN: No rashes or bruises.   LYMPHATIC: No cervical adenopathy.   NEUROLOGIC: Cranial nerves II through XII are intact. Normal gait.   LABORATORY, DIAGNOSTIC, AND RADIOLOGICAL DATA: Chemistries are within normal limits except for blood glucose of 128, BUN 21. LFTs within normal limits. TSH 1.34. Urine tox screen positive for MDMA. CBC: White blood count 4.2, hemoglobin 10.9, platelets 282. Urinalysis is suggestive of urinary tract infection with 2+ leukocyte esterase and 17 white cells per field. Urine culture is negative.   MENTAL STATUS EXAMINATION ON ADMISSION: The patient is alert and oriented to person, place, time, and situation. She is pleasant, polite, and cooperative, rather tearful. There is some psychomotor retardation. She is wearing street clothes. She maintains good eye contact. Her speech is soft. Mood is depressed with flat affect. Thought processing is logical and goal oriented. Thought content: She denies suicidal or homicidal ideation but does have passive thoughts of suicide and not wanting to press on. There are no delusions or paranoia. There are no auditory or visual hallucinations. Her cognition is grossly intact. She registers three out of three and recalls three out of three objects after five minutes. She can spell world forward and backward. She can name current Software engineer. Her insight  and judgment are fair.   SUICIDE RISK ASSESSMENT ON ADMISSION: This is a patient with long history of depression but no suicide attempt who came to the hospital with worsening of her symptoms in the face of multiple stressors.   ASSESSMENT:  AXIS I: Major depressive disorder, recurrent, severe.   AXIS II: Deferred.   AXIS III:  1. Hypertension.  2. Neuropathy. 3. B12 deficiency.   AXIS IV: Mental illness, family conflict.   AXIS V: GAF on admission 25.   PLAN: The patient was admitted to [facility] unit for safety, stabilization and medication management. She was initially placed on suicide precautions and was closely monitored for any unsafe behaviors. She underwent full psychiatric and risk assessment. She received pharmacotherapy, individual and group psychotherapy, substance abuse counseling, and support from therapeutic milieu.  1. Passive suicidal ideation. The patient is able to contract for safety.  2. Mood. The patient has been maintained on Lexapro for extended period of time. We will probably add Remeron to her regimen. Her financial situation is not very good. We need to make sure that we prescribe affordable medications.  3. Medical. We will continue all her medicines as prescribed by her primary provider.  4. Disposition. She will be discharged back to home. She will follow up with Dr. Bridgett Larsson. Family meeting  may be helpful.   ____________________________ Wardell Honour. Bary Leriche, MD jbp:cms D: 12/02/2011 13:29:39 ET T: 12/02/2011 14:14:53 ET JOB#: [id number]  cc: Xiara Knisley B. Bary Leriche, MD, <Dictator> Clovis Fredrickson MD ELECTRONICALLY SIGNED 12/06/2011 22:55

## 2015-01-13 NOTE — H&P (Signed)
PATIENT NAME:  Lori Gilmore, Lori Gilmore MR#:  [id number] DATE OF BIRTH:  03/06/1934  DATE OF ADMISSION:  12/12/2011  PRIMARY CARE PHYSICIAN: Dr. Raechel Ache    REFERRING PHYSICIAN: Dr. Thomasene Lot    CHIEF COMPLAINT: Left-sided numbness and weakness today.   HISTORY OF PRESENT ILLNESS: The patient is a 81 year old Caucasian female with a history of hypertension, diabetes, cerebrovascular accident, depression, anemia, brain hemorrhage due to trauma status post brain surgery 2 or 3 years ago, right kidney cancer status post frozen procedure who presented to the ED with left-sided numbness and weakness today. The patient was just discharged from the [facility] four days ago due to suicide intent. She is alert, awake, oriented in no acute distress. She stated that her left side is numb and face is numb, unable to move her left extremities. According to her daughter, the patient developed the symptoms after lunch at about 12 p.m. The patient denies any headache or dizziness. Denies any double vision, blurred vision, or dysphagia. She denies any incontinence, syncope, or loss of consciousness.   PAST MEDICAL HISTORY AS MENTIONED ABOVE:  1. Hypertension. 2. Diabetes. 3. Cerebrovascular accident. 4. Depression. 5. Anemia. 6. Brain hemorrhage due to trauma. 7. Renal cancer status post frozen procedure surgery.  PAST SURGICAL HISTORY: 1. Brain surgery.  2. Hysterectomy. 3. Kidney frozen procedure.   SOCIAL HISTORY: Denies any smoking, drinking, or illicit drugs.   FAMILY HISTORY: Mother had a stroke. Hypertension and diabetes in her family.   ALLERGIES: Erythromycin, heparin, Nucynta, Oramycin.   MEDICATIONS:  1. Atorvastatin 10 mg p.o. daily. 2. Escitalopram 20 mg p.o. daily.  3. Gabapentin 300 mg p.o. daily at bedtime. 4. HCTZ 1 mg p.o. in the morning.  5. Lisinopril 20 mg p.o. daily. 6. Remeron 15 mg p.o. at bedtime. 7. Trazodone 50 mg p.o. at bedtime p.r.n. for insomnia.  8. Vitamin B12 500 mcg  p.o. daily.   REVIEW OF SYSTEMS: CONSTITUTIONAL: The patient denies any fever or chills. No headache or dizziness. EYES: No double vision or blurred vision. No glaucoma. ENT: No epistaxis. No postnasal drainage. No dysphagia or slurred speech. CARDIOVASCULAR: No chest pain, palpitation, orthopnea, or nocturnal dyspnea. RESPIRATORY: No cough, sputum, shortness of breath, or hemoptysis. GI: No abdominal pain, nausea, vomiting, or diarrhea. No melena. No bloody stool. GU: No dysuria, hematuria, or incontinence. SKIN: No rash or jaundice. EXTREMITIES: No edema or muscle pain. NEUROLOGY: Positive for left side numbness and weakness but no syncope, loss of consciousness, or seizure. No incontinence or facial droop. HEMATOLOGY: No bleeding or easy bruising. PSYCH: Positive for depression but no anxiety.   PHYSICAL EXAMINATION:   VITALS: Temperature 97.6, blood pressure 157/70, oxygen saturation 100% on room air.   GENERAL: The patient is alert, awake, and oriented in no acute distress.   HEENT: Pupils round, equal and reactive to light and accommodation. Dry oral mucosa. Clear oropharynx.   NECK: Supple. No JVD. No carotid bruits. No lymphadenopathy. No thyromegaly.   CARDIOVASCULAR: S1, S2 regular rate and rhythm. No murmurs or gallops.   PULMONARY: Bilateral air entry. No wheezing or rales. Normal use of muscles to breathe.   ABDOMEN: Soft. No distention or tenderness. No organomegaly. Bowel sounds present.   EXTREMITIES: No edema, clubbing, or cyanosis. No calf tenderness. Strong pedal pulses.   NEUROLOGY: Alert and oriented x3. Left upper and lower extremity weakness, power 2 out of 5. The patient cannot move left arm due to previous history of left shoulder fracture last November. Sensation decreased on  the left side. Right side power 5 out of 5. Sensation intact. Also, the patient has some mild right side facial droop. Shoulder test is normal. Deep tendon reflexes mute. Babinski negative.    SKIN: No rash or jaundice.   LABORATORY DATA: Troponin less than 0.02. CK 172. CK-MB 0.5. WBC 5.2, hemoglobin 10.6, platelet 297, glucose 107, BUN 24, creatinine 1.0. Electrolytes normal. CAT scan of head showed no acute ischemic or hemorrhagic event; chronic small vessel ischemia Urinalysis negative.   IMPRESSION: 1. Left side numbness and weakness, rule out cerebrovascular accident.  2. Dehydration.  3. Hypertension, uncontrolled.  4. Diabetes.  5. Depression.  6. Anemia.  7. History of brain hemorrhage.  8. History of renal cancer, status post frozen procedure.   PLAN OF TREATMENT:  1. The patient will be admitted to telemetry floor.  2. Will get MRI of brain, carotid duplex, echocardiogram, and follow-up lipid panel and TSH.  3. Will give aspirin 325 mg p.o. daily and continue Lipitor.  4. For hypertension, continue lisinopril. Hold HCTZ due to dehydration and follow-up BMP.  5. GI and DVT prophylaxis. The patient has allergy to heparin. Will give TED for DVT prophylaxis.  6. For depression, there is interaction between Remeron and Escitalopram. Will hold Remeron.   I discussed the patient's situation with the patient's husband and daughter.   TIME SPENT: 65 minutes.   ____________________________ Demetrios Loll, MD qc:drc D: 12/12/2011 18:04:32 ET T: 12/13/2011 08:10:41 ET JOB#: [id number]  cc: Demetrios Loll, MD, <Dictator> Demetrios Loll MD ELECTRONICALLY SIGNED 12/13/2011 16:01

## 2015-01-13 NOTE — Discharge Summary (Signed)
PATIENT NAME:  Lori Gilmore, Lori Gilmore MR#:  [id number] DATE OF BIRTH:  10/31/1933  DATE OF ADMISSION:  12/12/2011 DATE OF DISCHARGE:  12/14/2011  DISCHARGE DIAGNOSES:  1. Left-sided weakness, numbness possibly due to stress reaction. No evidence of cerebrovascular accident. 2. Hypertension. 3. Diet controlled diabetes. 4. Depression. 5. Anemia.  6. Hyperlipidemia.   DISPOSITION: The patient is being discharged home with home health.   DIET: Low sodium, ADA diet.   ACTIVITY: As tolerated.   DISCHARGE MEDICATIONS:  1. Aspirin 81 mg daily.  2. Lipitor 10 mg daily.  3. Lexapro 20 mg daily.  4. Neurontin 300 mg at bedtime.  5. Vitamin B12 500 mcg daily.  6. HCTZ 12.5 mg daily.  7. Lisinopril 20 mg daily.  8. Remeron 15 mg at bedtime.  9. Trazodone 50 mg at bedtime p.r.n. insomnia.   LABORATORY, DIAGNOSTIC, AND RADIOLOGICAL DATA: MRI of the brain showed no acute abnormalities. CT scan of the head showed no acute abnormalities. Chest x-ray showed no acute abnormalities. Carotid ultrasound showed no hemodynamically significant stenosis. Echo showed normal EF with no valvular or wall motion abnormalities. Urinalysis showed no evidence of infection. Anemia of chronic disease, hemoglobin 10.6. White count and platelet count normal. CMP essentially normal. Cardiac enzymes negative. TSH 1.91.   HOSPITAL COURSE: The patient is a 81 year old female with past medical history of depression, hypertension, and diet controlled diabetes who presented with left-sided numbness and weakness. She was admitted with the diagnosis of TIA/CVA and underwent extensive work-up including CT of the head, MRI of the brain, carotid ultrasound, and echo which were all normal. The patient does have a history of depression and was recently admitted to [facility] less than a week ago for treatment. It was possible that her symptoms were related to stress reaction. Possibly the patient's presenting symptoms were related  to a stress reaction. The rest of her medical problems remained stable during the hospitalization and she is being discharged home in a stable condition. Her fasting lipid profile was also checked and her LDL is at goal.   TIME SPENT: 45 minutes.   ____________________________ Cherre Huger, MD sp:drc D: 12/14/2011 16:19:15 ET T: 12/14/2011 16:35:01 ET JOB#: [id number]  cc: Cherre Huger, MD, <Dictator> Cherre Huger MD ELECTRONICALLY SIGNED 12/15/2011 16:33

## 2015-02-11 ENCOUNTER — Encounter: Payer: Self-pay | Admitting: *Deleted

## 2015-02-12 NOTE — Discharge Instructions (Signed)

## 2015-02-13 ENCOUNTER — Ambulatory Visit
Admission: RE | Admit: 2015-02-13 | Discharge: 2015-02-13 | Disposition: A | Discharge status: Home / Self Care | Payer: Medicare Other | Source: Ambulatory Visit | Attending: Ophthalmology | Admitting: Ophthalmology

## 2015-02-13 ENCOUNTER — Ambulatory Visit: Payer: Medicare Other | Admitting: Anesthesiology

## 2015-02-13 ENCOUNTER — Encounter
Admission: RE | Disposition: A | Discharge status: Home / Self Care | Payer: Self-pay | Source: Ambulatory Visit | Attending: Ophthalmology

## 2015-02-13 DIAGNOSIS — K589 Irritable bowel syndrome without diarrhea: Secondary | ICD-10-CM | POA: Insufficient documentation

## 2015-02-13 DIAGNOSIS — Z888 Allergy status to other drugs, medicaments and biological substances status: Secondary | ICD-10-CM | POA: Diagnosis not present

## 2015-02-13 DIAGNOSIS — Z85528 Personal history of other malignant neoplasm of kidney: Secondary | ICD-10-CM | POA: Diagnosis not present

## 2015-02-13 DIAGNOSIS — I10 Essential (primary) hypertension: Secondary | ICD-10-CM | POA: Diagnosis not present

## 2015-02-13 DIAGNOSIS — Z86718 Personal history of other venous thrombosis and embolism: Secondary | ICD-10-CM | POA: Insufficient documentation

## 2015-02-13 DIAGNOSIS — T7840XA Allergy, unspecified, initial encounter: Secondary | ICD-10-CM | POA: Diagnosis not present

## 2015-02-13 DIAGNOSIS — H269 Unspecified cataract: Secondary | ICD-10-CM | POA: Diagnosis present

## 2015-02-13 DIAGNOSIS — Z9889 Other specified postprocedural states: Secondary | ICD-10-CM | POA: Insufficient documentation

## 2015-02-13 DIAGNOSIS — E1140 Type 2 diabetes mellitus with diabetic neuropathy, unspecified: Secondary | ICD-10-CM | POA: Insufficient documentation

## 2015-02-13 DIAGNOSIS — Z90710 Acquired absence of both cervix and uterus: Secondary | ICD-10-CM | POA: Diagnosis not present

## 2015-02-13 DIAGNOSIS — Z880 Allergy status to penicillin: Secondary | ICD-10-CM | POA: Insufficient documentation

## 2015-02-13 DIAGNOSIS — H2511 Age-related nuclear cataract, right eye: Secondary | ICD-10-CM | POA: Diagnosis not present

## 2015-02-13 DIAGNOSIS — Z9049 Acquired absence of other specified parts of digestive tract: Secondary | ICD-10-CM | POA: Diagnosis not present

## 2015-02-13 HISTORY — DX: Headache, unspecified: R51.9

## 2015-02-13 HISTORY — DX: Other seasonal allergic rhinitis: J30.2

## 2015-02-13 HISTORY — DX: Malignant neoplasm of unspecified kidney, except renal pelvis: C64.9

## 2015-02-13 HISTORY — DX: Major depressive disorder, single episode, unspecified: F32.9

## 2015-02-13 HISTORY — DX: Anemia, unspecified: D64.9

## 2015-02-13 HISTORY — DX: Depression, unspecified: F32.A

## 2015-02-13 HISTORY — DX: Unspecified osteoarthritis, unspecified site: M19.90

## 2015-02-13 HISTORY — DX: Transient cerebral ischemic attack, unspecified: G45.9

## 2015-02-13 HISTORY — DX: Personal history of other diseases of the digestive system: Z87.19

## 2015-02-13 HISTORY — DX: Headache: R51

## 2015-02-13 HISTORY — DX: Type 2 diabetes mellitus without complications: E11.9

## 2015-02-13 HISTORY — DX: Pure hypercholesterolemia, unspecified: E78.00

## 2015-02-13 HISTORY — PX: CATARACT EXTRACTION W/PHACO: SHX586

## 2015-02-13 HISTORY — DX: Occlusion and stenosis of unspecified cerebral artery: I66.9

## 2015-02-13 HISTORY — DX: Essential (primary) hypertension: I10

## 2015-02-13 HISTORY — DX: Presence of dental prosthetic device (complete) (partial): Z97.2

## 2015-02-13 LAB — GLUCOSE, CAPILLARY: GLUCOSE-CAPILLARY: 121 mg/dL — AB (ref 65–99)

## 2015-02-13 SURGERY — PHACOEMULSIFICATION, CATARACT, WITH IOL INSERTION
Anesthesia: Monitor Anesthesia Care | Laterality: Right

## 2015-02-13 MED ORDER — MIDAZOLAM HCL 2 MG/2ML IJ SOLN
INTRAMUSCULAR | Status: DC | PRN
  Administered 2015-02-13: 1.5 mg via INTRAVENOUS

## 2015-02-13 MED ORDER — TETRACAINE HCL 0.5 % OP SOLN
1.0000 [drp] | Freq: Once | OPHTHALMIC | Status: AC
  Administered 2015-02-13: 1 [drp] via OPHTHALMIC

## 2015-02-13 MED ORDER — CEFUROXIME OPHTHALMIC INJECTION 1 MG/0.1 ML
INJECTION | OPHTHALMIC | Status: DC | PRN
  Administered 2015-02-13: 0.1 mL via INTRACAMERAL

## 2015-02-13 MED ORDER — NA HYALUR & NA CHOND-NA HYALUR 0.4-0.35 ML IO KIT
PACK | INTRAOCULAR | Status: DC | PRN
  Administered 2015-02-13: 1 mL via INTRAOCULAR

## 2015-02-13 MED ORDER — BRIMONIDINE TARTRATE 0.2 % OP SOLN
OPHTHALMIC | Status: DC | PRN
  Administered 2015-02-13: 1 [drp] via OPHTHALMIC

## 2015-02-13 MED ORDER — TIMOLOL MALEATE 0.5 % OP SOLN
OPHTHALMIC | Status: DC | PRN
  Administered 2015-02-13: 1 [drp] via OPHTHALMIC

## 2015-02-13 MED ORDER — FENTANYL CITRATE (PF) 100 MCG/2ML IJ SOLN
INTRAMUSCULAR | Status: DC | PRN
  Administered 2015-02-13: 50 ug via INTRAVENOUS

## 2015-02-13 MED ORDER — EPINEPHRINE HCL 1 MG/ML IJ SOLN
INTRAMUSCULAR | Status: DC | PRN
  Administered 2015-02-13: 1 mL

## 2015-02-13 MED ORDER — POVIDONE-IODINE 5 % OP SOLN
1.0000 "application " | Freq: Once | OPHTHALMIC | Status: AC
  Administered 2015-02-13: 1 via OPHTHALMIC

## 2015-02-13 MED ORDER — ARMC OPHTHALMIC DILATING GEL
1.0000 "application " | OPHTHALMIC | Status: DC | PRN
  Administered 2015-02-13 (×2): 1 via OPHTHALMIC

## 2015-02-13 SURGICAL SUPPLY — 25 items
CANNULA ANT/CHMB 27GA (MISCELLANEOUS) ×3 IMPLANT
GLOVE SURG LX 7.5 STRW (GLOVE) ×2
GLOVE SURG LX STRL 7.5 STRW (GLOVE) ×1 IMPLANT
GLOVE SURG TRIUMPH 8.0 PF LTX (GLOVE) ×3 IMPLANT
GOWN STRL REUS W/ TWL LRG LVL3 (GOWN DISPOSABLE) ×2 IMPLANT
GOWN STRL REUS W/TWL LRG LVL3 (GOWN DISPOSABLE) ×4
LENS IOL TECNIS 24.0 (Intraocular Lens) ×3 IMPLANT
LENS IOL TECNIS MONO 1P 24.0 (Intraocular Lens) ×1 IMPLANT
MARKER SKIN SURG W/RULER VIO (MISCELLANEOUS) ×3 IMPLANT
NDL RETROBULBAR .5 NSTRL (NEEDLE) IMPLANT
NEEDLE FILTER BLUNT 18X 1/2SAF (NEEDLE) ×2
NEEDLE FILTER BLUNT 18X1 1/2 (NEEDLE) ×1 IMPLANT
PACK CATARACT BRASINGTON (MISCELLANEOUS) ×3 IMPLANT
PACK EYE AFTER SURG (MISCELLANEOUS) ×3 IMPLANT
PACK OPTHALMIC (MISCELLANEOUS) ×3 IMPLANT
RING MALYGIN 7.0 (MISCELLANEOUS) IMPLANT
SUT ETHILON 10-0 CS-B-6CS-B-6 (SUTURE)
SUT VICRYL  9 0 (SUTURE)
SUT VICRYL 9 0 (SUTURE) IMPLANT
SUTURE EHLN 10-0 CS-B-6CS-B-6 (SUTURE) IMPLANT
SYR 3ML LL SCALE MARK (SYRINGE) ×3 IMPLANT
SYR 5ML LL (SYRINGE) IMPLANT
SYR TB 1ML LUER SLIP (SYRINGE) ×3 IMPLANT
WATER STERILE IRR 500ML POUR (IV SOLUTION) ×3 IMPLANT
WIPE NON LINTING 3.25X3.25 (MISCELLANEOUS) ×3 IMPLANT

## 2015-02-13 NOTE — Op Note (Signed)
LOCATION:  [facility]   PREOPERATIVE DIAGNOSIS:    Nuclear sclerotic cataract right eye. H25.11   POSTOPERATIVE DIAGNOSIS:  Nuclear sclerotic cataract right eye.     PROCEDURE:  Phacoemusification with posterior chamber intraocular lens placement of the right eye   LENS:    ZCB00 24.0 D PCIOL   ULTRASOUND TIME: 18 % of 1 minutes, 27 seconds.  CDE 15.3   SURGEON:  Wyonia Hough, MD   ANESTHESIA:  Topical with tetracaine drops and 2% Xylocaine jelly.   COMPLICATIONS:  None.   DESCRIPTION OF PROCEDURE:  The patient was identified in the holding room and transported to the operating room and placed in the supine position under the operating microscope.  The right eye was identified as the operative eye and it was prepped and draped in the usual sterile ophthalmic fashion.   A 1 millimeter clear-corneal paracentesis was made at the 12:00 position.  The anterior chamber was filled with Viscoat viscoelastic.  A 2.4 millimeter keratome was used to make a near-clear corneal incision at the 9:00 position.  A curvilinear capsulorrhexis was made with a cystotome and capsulorrhexis forceps.  Balanced salt solution was used to hydrodissect and hydrodelineate the nucleus.   Phacoemulsification was then used in stop and chop fashion to remove the lens nucleus and epinucleus.  The remaining cortex was then removed using the irrigation and aspiration handpiece. Provisc was then placed into the capsular bag to distend it for lens placement.  A lens was then injected into the capsular bag.  The remaining viscoelastic was aspirated.   Wounds were hydrated with balanced salt solution.  The anterior chamber was inflated to a physiologic pressure with balanced salt solution.  No wound leaks were noted. Cefuroxime 0.1 ml of a 10mg /ml solution was injected into the anterior chamber for a dose of 1 mg of intracameral antibiotic at the completion of the case.   Timolol and Brimonidine drops were  applied to the eye.  The patient was taken to the recovery room in stable condition without complications of anesthesia or surgery.   Necia Kamm 02/13/2015, 7:56 AM

## 2015-02-13 NOTE — Anesthesia Postprocedure Evaluation (Signed)
  Anesthesia Post-op Note  Patient: Lori Gilmore  Procedure(s) Performed: Procedure(s) with comments: CATARACT EXTRACTION PHACO AND INTRAOCULAR LENS PLACEMENT (IOC) (Right) - DIABETIC  Anesthesia type:MAC  Patient location: PACU  Post pain: Pain level controlled  Post assessment: Post-op Vital signs reviewed, Patient's Cardiovascular Status Stable, Respiratory Function Stable, Patent Airway and No signs of Nausea or vomiting  Post vital signs: Reviewed and stable  Last Vitals:  Filed Vitals:   02/13/15 0758  BP: 125/64  Pulse: 69  Temp: 36.5 C  Resp: 16    Level of consciousness: awake, alert  and patient cooperative  Complications: No apparent anesthesia complications

## 2015-02-13 NOTE — Transfer of Care (Signed)
Immediate Anesthesia Transfer of Care Note  Patient: Lori Gilmore  Procedure(s) Performed: Procedure(s) with comments: CATARACT EXTRACTION PHACO AND INTRAOCULAR LENS PLACEMENT (IOC) (Right) - DIABETIC  Patient Location: PACU  Anesthesia Type: MAC  Level of Consciousness: awake, alert  and patient cooperative  Airway and Oxygen Therapy: Patient Spontanous Breathing and Patient connected to supplemental oxygen  Post-op Assessment: Post-op Vital signs reviewed, Patient's Cardiovascular Status Stable, Respiratory Function Stable, Patent Airway and No signs of Nausea or vomiting  Post-op Vital Signs: Reviewed and stable  Complications: No apparent anesthesia complications

## 2015-02-13 NOTE — Anesthesia Preprocedure Evaluation (Signed)
Anesthesia Evaluation  Patient identified by MRN, date of birth, ID band Patient awake    Reviewed: Allergy & Precautions, H&P , Patient's Chart, lab work & pertinent test results  History of Anesthesia Complications Negative for: history of anesthetic complications  Airway Mallampati: II  TM Distance: >3 FB Neck ROM: full    Dental  (+) Edentulous Upper, Edentulous Lower   Pulmonary neg pulmonary ROS,    Pulmonary exam normal       Cardiovascular hypertension, Normal cardiovascular exam Denies CP/SOB   Neuro/Psych  Headaches, Depression TIA   GI/Hepatic   Endo/Other  diabetes  Renal/GU   negative genitourinary   Musculoskeletal  (+) Arthritis -,   Abdominal   Peds negative pediatric ROS (+)  Hematology   Anesthesia Other Findings   Reproductive/Obstetrics                             Anesthesia Physical Anesthesia Plan  ASA: II  Anesthesia Plan: MAC   Post-op Pain Management:    Induction:   Airway Management Planned:   Additional Equipment:   Intra-op Plan:   Post-operative Plan:   Informed Consent: I have reviewed the patients History and Physical, chart, labs and discussed the procedure including the risks, benefits and alternatives for the proposed anesthesia with the patient or authorized representative who has indicated his/her understanding and acceptance.     Plan Discussed with: CRNA  Anesthesia Plan Comments:         Anesthesia Quick Evaluation

## 2015-02-13 NOTE — H&P (Signed)
  The History and Physical notes were scanned in.  The patient remains stable and unchanged from the H&P.   Previous H&P reviewed, patient examined, and there are no changes.  Lori Gilmore 02/13/2015 7:30 AM

## 2015-02-15 ENCOUNTER — Encounter: Payer: Self-pay | Admitting: Ophthalmology

## 2015-03-13 ENCOUNTER — Encounter: Payer: Self-pay | Admitting: *Deleted

## 2015-03-19 NOTE — Discharge Instructions (Signed)

## 2015-03-20 ENCOUNTER — Encounter
Admission: RE | Disposition: A | Discharge status: Home / Self Care | Payer: Self-pay | Source: Ambulatory Visit | Attending: Ophthalmology

## 2015-03-20 ENCOUNTER — Ambulatory Visit
Admission: RE | Admit: 2015-03-20 | Discharge: 2015-03-20 | Disposition: A | Discharge status: Home / Self Care | Payer: Medicare Other | Source: Ambulatory Visit | Attending: Ophthalmology | Admitting: Ophthalmology

## 2015-03-20 ENCOUNTER — Ambulatory Visit: Payer: Medicare Other | Admitting: Anesthesiology

## 2015-03-20 DIAGNOSIS — K449 Diaphragmatic hernia without obstruction or gangrene: Secondary | ICD-10-CM | POA: Diagnosis not present

## 2015-03-20 DIAGNOSIS — Z85528 Personal history of other malignant neoplasm of kidney: Secondary | ICD-10-CM | POA: Diagnosis not present

## 2015-03-20 DIAGNOSIS — Z9049 Acquired absence of other specified parts of digestive tract: Secondary | ICD-10-CM | POA: Insufficient documentation

## 2015-03-20 DIAGNOSIS — H2512 Age-related nuclear cataract, left eye: Secondary | ICD-10-CM | POA: Insufficient documentation

## 2015-03-20 DIAGNOSIS — E780 Pure hypercholesterolemia: Secondary | ICD-10-CM | POA: Insufficient documentation

## 2015-03-20 DIAGNOSIS — Z881 Allergy status to other antibiotic agents status: Secondary | ICD-10-CM | POA: Insufficient documentation

## 2015-03-20 DIAGNOSIS — I10 Essential (primary) hypertension: Secondary | ICD-10-CM | POA: Diagnosis not present

## 2015-03-20 DIAGNOSIS — F329 Major depressive disorder, single episode, unspecified: Secondary | ICD-10-CM | POA: Diagnosis not present

## 2015-03-20 DIAGNOSIS — R51 Headache: Secondary | ICD-10-CM | POA: Insufficient documentation

## 2015-03-20 DIAGNOSIS — Z8679 Personal history of other diseases of the circulatory system: Secondary | ICD-10-CM | POA: Diagnosis not present

## 2015-03-20 DIAGNOSIS — M7989 Other specified soft tissue disorders: Secondary | ICD-10-CM | POA: Insufficient documentation

## 2015-03-20 DIAGNOSIS — Z888 Allergy status to other drugs, medicaments and biological substances status: Secondary | ICD-10-CM | POA: Insufficient documentation

## 2015-03-20 DIAGNOSIS — Z9841 Cataract extraction status, right eye: Secondary | ICD-10-CM | POA: Insufficient documentation

## 2015-03-20 DIAGNOSIS — M1990 Unspecified osteoarthritis, unspecified site: Secondary | ICD-10-CM | POA: Insufficient documentation

## 2015-03-20 DIAGNOSIS — I739 Peripheral vascular disease, unspecified: Secondary | ICD-10-CM | POA: Diagnosis not present

## 2015-03-20 HISTORY — PX: CATARACT EXTRACTION W/PHACO: SHX586

## 2015-03-20 SURGERY — PHACOEMULSIFICATION, CATARACT, WITH IOL INSERTION
Anesthesia: Monitor Anesthesia Care | Laterality: Left | Wound class: Clean

## 2015-03-20 MED ORDER — FENTANYL CITRATE (PF) 100 MCG/2ML IJ SOLN
INTRAMUSCULAR | Status: DC | PRN
  Administered 2015-03-20: 50 ug via INTRAVENOUS

## 2015-03-20 MED ORDER — ARMC OPHTHALMIC DILATING GEL
1.0000 "application " | OPHTHALMIC | Status: DC | PRN
  Administered 2015-03-20 (×2): 1 via OPHTHALMIC

## 2015-03-20 MED ORDER — BRIMONIDINE TARTRATE 0.2 % OP SOLN
OPHTHALMIC | Status: DC | PRN
  Administered 2015-03-20: 1 [drp] via OPHTHALMIC

## 2015-03-20 MED ORDER — BSS IO SOLN
INTRAOCULAR | Status: DC | PRN
  Administered 2015-03-20: 71 mL via OPHTHALMIC

## 2015-03-20 MED ORDER — TIMOLOL MALEATE 0.5 % OP SOLN
OPHTHALMIC | Status: DC | PRN
  Administered 2015-03-20: 1 [drp] via OPHTHALMIC

## 2015-03-20 MED ORDER — NA HYALUR & NA CHOND-NA HYALUR 0.4-0.35 ML IO KIT
PACK | INTRAOCULAR | Status: DC | PRN
  Administered 2015-03-20: 1 mL via INTRAOCULAR

## 2015-03-20 MED ORDER — MIDAZOLAM HCL 2 MG/2ML IJ SOLN
INTRAMUSCULAR | Status: DC | PRN
  Administered 2015-03-20: 2 mg via INTRAVENOUS

## 2015-03-20 MED ORDER — CEFUROXIME OPHTHALMIC INJECTION 1 MG/0.1 ML
INJECTION | OPHTHALMIC | Status: DC | PRN
  Administered 2015-03-20: .3 mL via INTRACAMERAL

## 2015-03-20 MED ORDER — TETRACAINE HCL 0.5 % OP SOLN
1.0000 [drp] | Freq: Once | OPHTHALMIC | Status: AC
  Administered 2015-03-20: 1 [drp] via OPHTHALMIC

## 2015-03-20 MED ORDER — POVIDONE-IODINE 5 % OP SOLN
1.0000 "application " | OPHTHALMIC | Status: DC | PRN
  Administered 2015-03-20: 1 via OPHTHALMIC

## 2015-03-20 SURGICAL SUPPLY — 26 items
CANNULA ANT/CHMB 27GA (MISCELLANEOUS) ×3 IMPLANT
GLOVE SURG LX 7.5 STRW (GLOVE) ×2
GLOVE SURG LX STRL 7.5 STRW (GLOVE) ×1 IMPLANT
GLOVE SURG TRIUMPH 8.0 PF LTX (GLOVE) ×3 IMPLANT
GOWN STRL REUS W/ TWL LRG LVL3 (GOWN DISPOSABLE) ×2 IMPLANT
GOWN STRL REUS W/TWL LRG LVL3 (GOWN DISPOSABLE) ×4
LENS IOL TECNIS 23.5 (Intraocular Lens) ×3 IMPLANT
LENS IOL TECNIS MONO 1P 23.5 (Intraocular Lens) ×1 IMPLANT
MARKER SKIN SURG W/RULER VIO (MISCELLANEOUS) ×3 IMPLANT
NDL RETROBULBAR .5 NSTRL (NEEDLE) IMPLANT
NEEDLE FILTER BLUNT 18X 1/2SAF (NEEDLE) ×2
NEEDLE FILTER BLUNT 18X1 1/2 (NEEDLE) ×1 IMPLANT
PACK CATARACT BRASINGTON (MISCELLANEOUS) ×3 IMPLANT
PACK EYE AFTER SURG (MISCELLANEOUS) ×3 IMPLANT
PACK OPTHALMIC (MISCELLANEOUS) ×3 IMPLANT
RING MALYGIN 7.0 (MISCELLANEOUS) IMPLANT
SUT ETHILON 10-0 CS-B-6CS-B-6 (SUTURE)
SUT VICRYL  9 0 (SUTURE)
SUT VICRYL 9 0 (SUTURE) IMPLANT
SUTURE EHLN 10-0 CS-B-6CS-B-6 (SUTURE) IMPLANT
SYR 3ML LL SCALE MARK (SYRINGE) ×3 IMPLANT
SYR 5ML LL (SYRINGE) IMPLANT
SYR TB 1ML LUER SLIP (SYRINGE) ×3 IMPLANT
WATER STERILE IRR 250ML POUR (IV SOLUTION) ×3 IMPLANT
WATER STERILE IRR 500ML POUR (IV SOLUTION) IMPLANT
WIPE NON LINTING 3.25X3.25 (MISCELLANEOUS) ×3 IMPLANT

## 2015-03-20 NOTE — Anesthesia Postprocedure Evaluation (Signed)
  Anesthesia Post-op Note  Patient: Lori Gilmore  Procedure(s) Performed: Procedure(s): CATARACT EXTRACTION PHACO AND INTRAOCULAR LENS PLACEMENT (IOC) (Left)  Anesthesia type:MAC  Patient location: PACU  Post pain: Pain level controlled  Post assessment: Post-op Vital signs reviewed, Patient's Cardiovascular Status Stable, Respiratory Function Stable, Patent Airway and No signs of Nausea or vomiting  Post vital signs: Reviewed and stable  Last Vitals:  Filed Vitals:   03/20/15 1056  BP: 126/74  Pulse:   Temp:   Resp:     Level of consciousness: awake, alert  and patient cooperative  Complications: No apparent anesthesia complications

## 2015-03-20 NOTE — Op Note (Signed)
OPERATIVE NOTE  CALLEE ROHRIG [id number] 03/20/2015   PREOPERATIVE DIAGNOSIS:  Nuclear sclerotic cataract left eye. H25.12   POSTOPERATIVE DIAGNOSIS:    Nuclear sclerotic cataract left eye.     PROCEDURE:  Phacoemusification with posterior chamber intraocular lens placement of the left eye   LENS:   Implant Name Type Inv. Item Serial No. Manufacturer Lot No. LRB No. Used  LENS IMPL INTRAOC ZCB00 23.5 - [id number] Intraocular Lens LENS IMPL INTRAOC ZCB00 23.5 [id number] AMO   Left 1        ULTRASOUND TIME: 19  % of 1 minutes 31 seconds, CDE 17.6  SURGEON:  Wyonia Hough, MD   ANESTHESIA:  Topical with tetracaine drops and 2% Xylocaine jelly.   COMPLICATIONS:  None.   DESCRIPTION OF PROCEDURE:  The patient was identified in the holding room and transported to the operating room and placed in the supine position under the operating microscope.  The left eye was identified as the operative eye and it was prepped and draped in the usual sterile ophthalmic fashion.   A 1 millimeter clear-corneal paracentesis was made at the 1:30 position.  The anterior chamber was filled with Viscoat viscoelastic.  A 2.4 millimeter keratome was used to make a near-clear corneal incision at the 10:30 position.  .  A curvilinear capsulorrhexis was made with a cystotome and capsulorrhexis forceps.  Balanced salt solution was used to hydrodissect and hydrodelineate the nucleus.   Phacoemulsification was then used in stop and chop fashion to remove the lens nucleus and epinucleus.  The remaining cortex was then removed using the irrigation and aspiration handpiece. Provisc was then placed into the capsular bag to distend it for lens placement.  A lens was then injected into the capsular bag.  The remaining viscoelastic was aspirated.   Wounds were hydrated with balanced salt solution.  The anterior chamber was inflated to a physiologic pressure with balanced salt solution.  No wound leaks were noted.  Cefuroxime 0.1 ml of a 10mg /ml solution was injected into the anterior chamber for a dose of 1 mg of intracameral antibiotic at the completion of the case.   Timolol and Brimonidine drops were applied to the eye.  The patient was taken to the recovery room in stable condition without complications of anesthesia or surgery.  Wylodean Shimmel 03/20/2015, 12:04 PM

## 2015-03-20 NOTE — Transfer of Care (Signed)
Immediate Anesthesia Transfer of Care Note  Patient: Lori Gilmore  Procedure(s) Performed: Procedure(s): CATARACT EXTRACTION PHACO AND INTRAOCULAR LENS PLACEMENT (IOC) (Left)  Patient Location: PACU  Anesthesia Type: MAC  Level of Consciousness: awake, alert  and patient cooperative  Airway and Oxygen Therapy: Patient Spontanous Breathing and Patient connected to supplemental oxygen  Post-op Assessment: Post-op Vital signs reviewed, Patient's Cardiovascular Status Stable, Respiratory Function Stable, Patent Airway and No signs of Nausea or vomiting  Post-op Vital Signs: Reviewed and stable  Complications: No apparent anesthesia complications

## 2015-03-20 NOTE — H&P (Signed)
  The History and Physical notes were scanned in.  The patient remains stable and unchanged from the H&P.   Previous H&P reviewed, patient examined, and there are no changes.  Lori Gilmore 03/20/2015 11:11 AM

## 2015-03-20 NOTE — Anesthesia Procedure Notes (Signed)
Procedure Name: MAC Performed by: Rubina Basinski Pre-anesthesia Checklist: Patient identified, Emergency Drugs available, Suction available, Timeout performed and Patient being monitored Patient Re-evaluated:Patient Re-evaluated prior to inductionOxygen Delivery Method: Nasal cannula Placement Confirmation: positive ETCO2     

## 2015-03-20 NOTE — Anesthesia Preprocedure Evaluation (Signed)
Anesthesia Evaluation  Patient identified by MRN, date of birth, ID band Patient awake    Reviewed: Allergy & Precautions, NPO status , Patient's Chart, lab work & pertinent test results  Airway Mallampati: II  TM Distance: >3 FB Neck ROM: Full    Dental no notable dental hx.    Pulmonary neg pulmonary ROS,  breath sounds clear to auscultation  Pulmonary exam normal       Cardiovascular hypertension, + Peripheral Vascular Disease negative cardio ROS Normal cardiovascular examRhythm:Regular Rate:Normal     Neuro/Psych  Headaches, negative neurological ROS  negative psych ROS   GI/Hepatic negative GI ROS, Neg liver ROS, hiatal hernia,   Endo/Other  negative endocrine ROSdiabetes  Renal/GU negative Renal ROS  negative genitourinary   Musculoskeletal negative musculoskeletal ROS (+) Arthritis -,   Abdominal   Peds negative pediatric ROS (+)  Hematology negative hematology ROS (+)   Anesthesia Other Findings   Reproductive/Obstetrics negative OB ROS                             Anesthesia Physical Anesthesia Plan  ASA: II  Anesthesia Plan: MAC   Post-op Pain Management:    Induction: Intravenous  Airway Management Planned:   Additional Equipment:   Intra-op Plan:   Post-operative Plan: Extubation in OR  Informed Consent: I have reviewed the patients History and Physical, chart, labs and discussed the procedure including the risks, benefits and alternatives for the proposed anesthesia with the patient or authorized representative who has indicated his/her understanding and acceptance.   Dental advisory given  Plan Discussed with: CRNA  Anesthesia Plan Comments:         Anesthesia Quick Evaluation

## 2015-03-21 ENCOUNTER — Encounter: Payer: Self-pay | Admitting: Ophthalmology

## 2015-04-05 ENCOUNTER — Ambulatory Visit
Admission: EM | Admit: 2015-04-05 | Discharge: 2015-04-05 | Disposition: A | Discharge status: Home / Self Care | Payer: Medicare Other | Attending: Family Medicine | Admitting: Family Medicine

## 2015-04-05 ENCOUNTER — Ambulatory Visit: Payer: Medicare Other

## 2015-04-05 ENCOUNTER — Encounter: Payer: Self-pay | Admitting: Emergency Medicine

## 2015-04-05 DIAGNOSIS — M4316 Spondylolisthesis, lumbar region: Secondary | ICD-10-CM | POA: Insufficient documentation

## 2015-04-05 DIAGNOSIS — M47816 Spondylosis without myelopathy or radiculopathy, lumbar region: Secondary | ICD-10-CM | POA: Diagnosis not present

## 2015-04-05 DIAGNOSIS — M549 Dorsalgia, unspecified: Secondary | ICD-10-CM | POA: Diagnosis present

## 2015-04-05 MED ORDER — MELOXICAM 7.5 MG PO TABS: 7.5000 mg | ORAL_TABLET | Freq: Every day | ORAL | Status: DC

## 2015-04-05 NOTE — ED Provider Notes (Signed)
CSN: [id number]     Arrival date & time 04/05/15  1313 History   First MD Initiated Contact with Patient 04/05/15 1345     Chief Complaint  Patient presents with  . Back Pain   (Consider location/radiation/quality/duration/timing/severity/associated sxs/prior Treatment) HPI  81 year old female who presents with pain in her back indicating both sacroiliac joints without radiation which has been worse the last 2 months. She initially injured her back when she fell on a cement block around December 2015. She states that she had severe black and blue of her buttocks that eventually resolved and her pain did seem to go away. However she has been more active lately since her husband died 2 months ago and she's been left to do a lot of the chores on her own. She also admits to a previous back injury 6 months prior to the first which injured mostly the right low back but that too resolved. He denies any incontinence. He states that sitting is her worse activity. There is no radicular signs or symptoms. She uses a cane for ambulation. States that she has frequent falls from lightheadedness but her primary has told her that this is normal with her history. She has weakness in her lower extremities she blames on neuropathy.  Past Medical History  Diagnosis Date  . Hypertension   . History of hiatal hernia   . IBS (irritable bowel syndrome)   . Anemia   . Diabetes mellitus without complication     Diet controlled  . Renal cancer   . Blood clots in brain   . Depression   . Hypercholesteremia   . Seasonal allergies   . Arthritis     right knee  . Headache     sinus  . Wears dentures     full upper and lower  . TIA (transient ischemic attack) 2000    no deficits   Past Surgical History  Procedure Laterality Date  . Appendectomy    . Abdominal hysterectomy    . Brain surgery      for blood clot  . Cholecystectomy    . Cataract extraction w/phaco Right 02/13/2015    Procedure: CATARACT  EXTRACTION PHACO AND INTRAOCULAR LENS PLACEMENT (IOC);  Surgeon: Leandrew Koyanagi, MD;  Location: [facility];  Service: Ophthalmology;  Laterality: Right;  DIABETIC  . Cataract extraction w/phaco Left 03/20/2015    Procedure: CATARACT EXTRACTION PHACO AND INTRAOCULAR LENS PLACEMENT (IOC);  Surgeon: Leandrew Koyanagi, MD;  Location: [facility];  Service: Ophthalmology;  Laterality: Left;   History reviewed. No pertinent family history. History  Substance Use Topics  . Smoking status: Never Smoker   . Smokeless tobacco: Not on file  . Alcohol Use: No   OB History    No data available     Review of Systems  Constitutional: Positive for activity change.  Musculoskeletal: Positive for back pain and gait problem.  Neurological: Positive for light-headedness.  All other systems reviewed and are negative.   Allergies  Amoxicillin and Heparin  Home Medications   Prior to Admission medications   Medication Sig Start Date End Date Taking? Authorizing Provider  aspirin 81 MG tablet Take 81 mg by mouth daily. AM    Historical Provider, MD  atorvastatin (LIPITOR) 10 MG tablet Take 10 mg by mouth daily. AM    Historical Provider, MD  buPROPion (WELLBUTRIN XL) 150 MG 24 hr tablet Take 150 mg by mouth daily. AM    Historical Provider, MD  escitalopram (LEXAPRO)  20 MG tablet Take 20 mg by mouth daily. AM    Historical Provider, MD  lisinopril-hydrochlorothiazide (PRINZIDE,ZESTORETIC) 20-12.5 MG per tablet Take 1 tablet by mouth daily. AM    Historical Provider, MD  meloxicam (MOBIC) 7.5 MG tablet Take 1 tablet (7.5 mg total) by mouth daily. 04/05/15   Lorin Picket, PA-C  memantine (NAMENDA) 5 MG tablet Take 5 mg by mouth daily. PM    Historical Provider, MD  multivitamin-iron-minerals-folic acid (CENTRUM) chewable tablet Chew 1 tablet by mouth daily. AM    Historical Provider, MD  vitamin B-12 (CYANOCOBALAMIN) 500 MCG tablet Take 500 mcg by mouth daily. AM    Historical  Provider, MD   BP 128/55 mmHg  Pulse 72  Temp(Src) 98.8 F (37.1 C) (Tympanic)  Resp 16  Ht 5\' 8"  (1.727 m)  Wt 126 lb (57.153 kg)  BMI 19.16 kg/m2  SpO2 97% Physical Exam  Constitutional: She is oriented to person, place, and time. She appears well-developed and well-nourished.  HENT:  Head: Normocephalic and atraumatic.  Eyes: EOM are normal. Pupils are equal, round, and reactive to light.  Musculoskeletal: Normal range of motion. She exhibits tenderness. She exhibits no edema.  Examination of the back was carried out with Nira Conn, RN as chaperone. The pelvis is level in stance. There is no ecchymosis or erythema. Next the tenderness is over the posterior sacrum and sacroiliac joints but the coccyx is not painful. Patient is not able to toe or heel walk. However the EHL anterior tibialis and peroneal are strong to clinical stressing. Sensation is intact to light touch in the lower extremities. DTRs are bilaterally symmetrical 1+ over 4. Straight leg raise testing is negative bilaterally in the sitting position at 90.  Neurological: She is alert and oriented to person, place, and time. Coordination normal.  Skin: Skin is warm and dry.  Psychiatric: She has a normal mood and affect. Her behavior is normal. Judgment and thought content normal.    ED Course  Procedures (including critical care time) Labs Review Labs Reviewed - No data to display  Imaging Review Dg Lumbar Spine Complete  04/05/2015   CLINICAL DATA:  Low back pain.  EXAM: LUMBAR SPINE - COMPLETE 4+ VIEW  COMPARISON:  CT scan of the abdomen dated 04/12/2012 head CT scan of the chest dated 07/26/2013  FINDINGS: There is diffuse osteopenia. No disc space narrowing. Grade 1 spondylolisthesis of L5 on S1 due to moderately severe bilateral facet arthritis. There is partial lumbarization of the S1 segment. There are vestigial ribs at L1.  IMPRESSION: No acute abnormalities. Degenerative facet arthritis at L5-S1 with grade 1  spondylolisthesis.   Electronically Signed   By: Lorriane Shire M.D.   On: 04/05/2015 15:18     MDM   1. Facet degeneration of lumbar region    New Prescriptions   MELOXICAM (MOBIC) 7.5 MG TABLET    Take 1 tablet (7.5 mg total) by mouth daily.   Plan: 1. Test/x-ray results and diagnosis reviewed with patient 2. rx as per orders; risks, benefits, potential side effects reviewed with patient 3. Recommend supportive treatment with heat/ice muscle rubs and limit sitting. 4. F/u prn if symptoms worsen or don't improve with PCP      Lorin Picket, PA-C 04/05/15 1539

## 2015-04-05 NOTE — ED Notes (Signed)
Patient c/o lower back pain since December 2015.  Patient reports she fell back in December 2015.

## 2015-05-03 DIAGNOSIS — Z79899 Other long term (current) drug therapy: Secondary | ICD-10-CM | POA: Insufficient documentation

## 2015-09-19 ENCOUNTER — Ambulatory Visit
Admission: EM | Admit: 2015-09-19 | Discharge: 2015-09-19 | Disposition: A | Discharge status: Home / Self Care | Payer: Medicare Other | Attending: Family Medicine | Admitting: Family Medicine

## 2015-09-19 ENCOUNTER — Encounter: Payer: Self-pay | Admitting: Emergency Medicine

## 2015-09-19 DIAGNOSIS — R7301 Impaired fasting glucose: Secondary | ICD-10-CM

## 2015-09-19 DIAGNOSIS — R002 Palpitations: Secondary | ICD-10-CM

## 2015-09-19 DIAGNOSIS — J029 Acute pharyngitis, unspecified: Secondary | ICD-10-CM

## 2015-09-19 DIAGNOSIS — Z8659 Personal history of other mental and behavioral disorders: Secondary | ICD-10-CM

## 2015-09-19 DIAGNOSIS — H66001 Acute suppurative otitis media without spontaneous rupture of ear drum, right ear: Secondary | ICD-10-CM | POA: Diagnosis not present

## 2015-09-19 LAB — GLUCOSE, CAPILLARY: GLUCOSE-CAPILLARY: 94 mg/dL (ref 65–99)

## 2015-09-19 LAB — RAPID STREP SCREEN (MED CTR MEBANE ONLY): Streptococcus, Group A Screen (Direct): NEGATIVE

## 2015-09-19 MED ORDER — CEFUROXIME AXETIL 250 MG PO TABS: 250.0000 mg | ORAL_TABLET | Freq: Two times a day (BID) | ORAL | Status: DC

## 2015-09-19 MED ORDER — LORATADINE-PSEUDOEPHEDRINE ER 5-120 MG PO TB12: 1.0000 | ORAL_TABLET | Freq: Two times a day (BID) | ORAL | Status: DC

## 2015-09-19 NOTE — ED Notes (Signed)
Patient states that she checked her blood sugar today at 0930 and the result was 250. Blood sugar has been rechecked at beginning of triage today and the result is 94. Patient state that she has not eaten food or drank fluids since the 0930 test. She reports weakness and she appears to be depressed. Patient's husband passed away 8 months ago.

## 2015-09-19 NOTE — Discharge Instructions (Signed)
Hyperglycemia High blood sugar (hyperglycemia) means that the level of sugar in your blood is higher than it should be. Signs of high blood sugar include:  Feeling thirsty.  Frequent peeing (urinating).  Feeling tired or sleepy.  Dry mouth.  Vision changes.  Feeling weak.  Feeling hungry but losing weight.  Numbness and tingling in your hands or feet.  Headache. When you ignore these signs, your blood sugar may keep going up. These problems may get worse, and other problems may begin. HOME CARE  Check your blood sugars as told by your doctor. Write down the numbers with the date and time.  Take the right amount of insulin or diabetes pills at the right time. Write down the dose with date and time.  Refill your insulin or diabetes pills before running out.  Watch what you eat. Follow your meal plan.  Drink liquids without sugar, such as water. Check with your doctor if you have kidney or heart disease.  Follow your doctor's orders for exercise. Exercise at the same time of day.  Keep your doctor's appointments. GET HELP RIGHT AWAY IF:   You have trouble thinking or are confused.  You have fast breathing with fruity smelling breath.  You pass out (faint).  You have 2 to 3 days of high blood sugars and you do not know why.  You have chest pain.  You are feeling sick to your stomach (nauseous) or throwing up (vomiting).  You have sudden vision changes. MAKE SURE YOU:   Understand these instructions.  Will watch your condition.  Will get help right away if you are not doing well or get worse.   This information is not intended to replace advice given to you by your health care provider. Make sure you discuss any questions you have with your health care provider.   Document Released: 07/05/2009 Document Revised: 09/28/2014 Document Reviewed: 05/14/2015 Elsevier Interactive Patient Education 2016 [place].  Otitis Media, Adult Otitis media is redness,  soreness, and puffiness (swelling) in the space just behind your eardrum (middle ear). It may be caused by allergies or infection. It often happens along with a cold. HOME CARE  Take your medicine as told. Finish it even if you start to feel better.  Only take over-the-counter or prescription medicines for pain, discomfort, or fever as told by your doctor.  Follow up with your doctor as told. GET HELP IF:  You have otitis media only in one ear, or bleeding from your nose, or both.  You notice a lump on your neck.  You are not getting better in 3-5 days.  You feel worse instead of better. GET HELP RIGHT AWAY IF:   You have pain that is not helped with medicine.  You have puffiness, redness, or pain around your ear.  You get a stiff neck.  You cannot move part of your face (paralysis).  You notice that the bone behind your ear hurts when you touch it. MAKE SURE YOU:   Understand these instructions.  Will watch your condition.  Will get help right away if you are not doing well or get worse.   This information is not intended to replace advice given to you by your health care provider. Make sure you discuss any questions you have with your health care provider.   Document Released: 02/24/2008 Document Revised: 09/28/2014 Document Reviewed: 04/04/2013 Elsevier Interactive Patient Education 2016 [employer]  Pharyngitis Pharyngitis is a sore throat (pharynx). There is redness, pain, and swelling  of your throat. HOME CARE   Drink enough fluids to keep your pee (urine) clear or pale yellow.  Only take medicine as told by your doctor.  You may get sick again if you do not take medicine as told. Finish your medicines, even if you start to feel better.  Do not take aspirin.  Rest.  Rinse your mouth (gargle) with salt water ( tsp of salt per 1 qt of water) every 1-2 hours. This will help the pain.  If you are not at risk for choking, you can suck on hard candy or  sore throat lozenges. GET HELP IF:  You have large, tender lumps on your neck.  You have a rash.  You cough up green, yellow-brown, or bloody spit. GET HELP RIGHT AWAY IF:   You have a stiff neck.  You drool or cannot swallow liquids.  You throw up (vomit) or are not able to keep medicine or liquids down.  You have very bad pain that does not go away with medicine.  You have problems breathing (not from a stuffy nose). MAKE SURE YOU:   Understand these instructions.  Will watch your condition.  Will get help right away if you are not doing well or get worse.   This information is not intended to replace advice given to you by your health care provider. Make sure you discuss any questions you have with your health care provider.   Document Released: 02/24/2008 Document Revised: 06/28/2013 Document Reviewed: 05/15/2013 Elsevier Interactive Patient Education [employer].

## 2015-09-19 NOTE — ED Provider Notes (Signed)
CSN: [id number]     Arrival date & time 09/19/15  1255 History   First MD Initiated Contact with Patient 09/19/15 1552      Nurses notes were reviewed. Chief Complaint  Patient presents with  . Hyperglycemia    Patient is here because of fluctuation of her blood sugars. She reports that blood sugar was 250 today it has since dropped to the 90s. She sees Dr. Dorthula Perfect who has taken off her diabetic medication. She does not know what her A1c was the last time it was checked. She's had some falls and dizziness as well as a history of anxiety and recent death of her husband 8 months ago. She also feels that she may be coming down with something because she's had some pressure behind both ears and she's had a sore throat and left heel as if there is something in her throat scratching it. She states earlier today she was having some palpitations but that has stopped since then.   (Consider location/radiation/quality/duration/timing/severity/associated sxs/prior Treatment) Patient is a 81 y.o. female presenting with hyperglycemia. The history is provided by the patient and a relative. No language interpreter was used.  Hyperglycemia Severity:  Moderate Duration:  6 hours Timing:  Sporadic Progression:  Waxing and waning Diabetes status:  Controlled with diet Context: change in medication and recent illness   Relieved by:  Nothing Associated symptoms: weakness   Associated symptoms: no chest pain and no fever   Risk factors: family hx of diabetes     Past Medical History  Diagnosis Date  . Hypertension   . History of hiatal hernia   . IBS (irritable bowel syndrome)   . Anemia   . Diabetes mellitus without complication ([place])     Diet controlled  . Renal cancer ([place])   . Blood clots in brain   . Depression   . Hypercholesteremia   . Seasonal allergies   . Arthritis     right knee  . Headache     sinus  . Wears dentures     full upper and lower  . TIA (transient ischemic attack) 2000     no deficits   Past Surgical History  Procedure Laterality Date  . Appendectomy    . Abdominal hysterectomy    . Brain surgery      for blood clot  . Cholecystectomy    . Cataract extraction w/phaco Right 02/13/2015    Procedure: CATARACT EXTRACTION PHACO AND INTRAOCULAR LENS PLACEMENT (IOC);  Surgeon: Leandrew Koyanagi, MD;  Location: [facility];  Service: Ophthalmology;  Laterality: Right;  DIABETIC  . Cataract extraction w/phaco Left 03/20/2015    Procedure: CATARACT EXTRACTION PHACO AND INTRAOCULAR LENS PLACEMENT (IOC);  Surgeon: Leandrew Koyanagi, MD;  Location: [facility];  Service: Ophthalmology;  Laterality: Left;   History reviewed. No pertinent family history. Social History  Substance Use Topics  . Smoking status: Never Smoker   . Smokeless tobacco: None  . Alcohol Use: No   OB History    No data available     Review of Systems  Constitutional: Negative for fever.       General malaise  HENT: Positive for ear pain and sore throat.   Respiratory: Positive for cough.   Cardiovascular: Positive for palpitations. Negative for chest pain.  Neurological: Positive for weakness.  All other systems reviewed and are negative.   Allergies  Amoxicillin and Heparin  Home Medications   Prior to Admission medications   Medication Sig Start Date  End Date Taking? Authorizing Provider  aspirin 81 MG tablet Take 81 mg by mouth daily. AM    Historical Provider, MD  atorvastatin (LIPITOR) 10 MG tablet Take 10 mg by mouth daily. AM    Historical Provider, MD  buPROPion (WELLBUTRIN XL) 150 MG 24 hr tablet Take 150 mg by mouth daily. AM    Historical Provider, MD  cefUROXime (CEFTIN) 250 MG tablet Take 1 tablet (250 mg total) by mouth 2 (two) times daily. 09/19/15   Frederich Cha, MD  escitalopram (LEXAPRO) 20 MG tablet Take 20 mg by mouth daily. AM    Historical Provider, MD  lisinopril-hydrochlorothiazide (PRINZIDE,ZESTORETIC) 20-12.5 MG per tablet Take 1  tablet by mouth daily. AM    Historical Provider, MD  loratadine-pseudoephedrine (CLARITIN-D 12 HOUR) 5-120 MG tablet Take 1 tablet by mouth 2 (two) times daily. 09/19/15   Frederich Cha, MD  meloxicam (MOBIC) 7.5 MG tablet Take 1 tablet (7.5 mg total) by mouth daily. 04/05/15   Lorin Picket, PA-C  memantine (NAMENDA) 5 MG tablet Take 5 mg by mouth daily. PM    Historical Provider, MD  multivitamin-iron-minerals-folic acid (CENTRUM) chewable tablet Chew 1 tablet by mouth daily. AM    Historical Provider, MD  vitamin B-12 (CYANOCOBALAMIN) 500 MCG tablet Take 500 mcg by mouth daily. AM    Historical Provider, MD   Meds Ordered and Administered this Visit  Medications - No data to display  BP 169/68 mmHg  Pulse 67  Temp(Src) 97.2 F (36.2 C) (Tympanic)  Resp 16  Ht 5\' 8"  (1.727 m)  Wt 120 lb (54.432 kg)  BMI 18.25 kg/m2  SpO2 100% No data found.   Physical Exam  Constitutional: She is oriented to person, place, and time. She appears well-nourished.  Elderly white female frail  HENT:  Head: Normocephalic and atraumatic.  Right Ear: Hearing, external ear and ear canal normal. Tympanic membrane is erythematous and bulging.  Left Ear: External ear and ear canal normal. Tympanic membrane is not erythematous. No decreased hearing is noted.  Nose: No mucosal edema or sinus tenderness. Right sinus exhibits no maxillary sinus tenderness. Left sinus exhibits no maxillary sinus tenderness.  Mouth/Throat: No dental caries. Posterior oropharyngeal erythema present. No posterior oropharyngeal edema.  Eyes: Conjunctivae are normal. Pupils are equal, round, and reactive to light.  Neck: Normal range of motion. Neck supple.  Cardiovascular: Normal rate and regular rhythm.   Pulmonary/Chest: Effort normal and breath sounds normal.  Musculoskeletal: Normal range of motion.  Neurological: She is alert and oriented to person, place, and time.  Skin: Skin is warm and dry.  Psychiatric: She has a normal  mood and affect.  Vitals reviewed.   ED Course  Procedures (including critical care time)  Labs Review Labs Reviewed  RAPID STREP SCREEN (NOT AT [facility])  CULTURE, GROUP A STREP ([facility] ONLY)  GLUCOSE, CAPILLARY    Imaging Review No results found.   Visual Acuity Review  Right Eye Distance:   Left Eye Distance:   Bilateral Distance:    Right Eye Near:   Left Eye Near:    Bilateral Near:      Results for orders placed or performed during the hospital encounter of 09/19/15  Rapid strep screen  Result Value Ref Range   Streptococcus, Group A Screen (Direct) NEGATIVE NEGATIVE  Glucose, capillary  Result Value Ref Range   Glucose-Capillary 94 65 - 99 mg/dL     MDM   1. Elevated fasting blood sugar   2. Acute  suppurative otitis media of right ear without spontaneous rupture of tympanic membrane, recurrence not specified   3. Pharyngitis   4. Palpitation   5. History of anxiety     Explained to the patient and her daughter as far as the hypoglycemia/hyperglycemia since we demonstrated that her blood sugar has fluctuated this much I would not put her on a new medication but would recommend following up with Dr. Dorthula Perfect in 1-2 weeks or repeat A1c if his do to assess whether she needs to be placed on diabetic medication.  Because of the pharyngitis and right ear infection if the right ear will be treated. If strep test is positive we'll place on a Z-Pak if strep is negative would go with Ceftin 250 one tablet twice a day for 10 days since she is allergic to amoxicillin. Follow-up with Dr. Dorthula Perfect as needed. And with Claritin-D 12 hours 1 tablet once or twice a day as needed  Should be noted that she is in a sinus rhythm no signs of active palpitations now. She denies any chest pains and no further workup will be done at that time for that issue or problem.  Frederich Cha, MD 09/19/15 [phone]

## 2015-09-22 LAB — CULTURE, GROUP A STREP (THRC)

## 2015-10-16 ENCOUNTER — Ambulatory Visit
Admission: EM | Admit: 2015-10-16 | Discharge: 2015-10-16 | Disposition: A | Discharge status: Home / Self Care | Payer: Medicare Other | Attending: Family Medicine | Admitting: Family Medicine

## 2015-10-16 ENCOUNTER — Encounter: Payer: Self-pay | Admitting: *Deleted

## 2015-10-16 DIAGNOSIS — F419 Anxiety disorder, unspecified: Secondary | ICD-10-CM

## 2015-10-16 DIAGNOSIS — E119 Type 2 diabetes mellitus without complications: Secondary | ICD-10-CM | POA: Diagnosis not present

## 2015-10-16 LAB — GLUCOSE, CAPILLARY: GLUCOSE-CAPILLARY: 102 mg/dL — AB (ref 65–99)

## 2015-10-16 MED ORDER — LORAZEPAM 0.5 MG PO TABS: ORAL_TABLET | ORAL | Status: DC

## 2015-10-16 NOTE — ED Provider Notes (Signed)
CSN: [id number]     Arrival date & time 10/16/15  1437 History   First MD Initiated Contact with Patient 10/16/15 1529     Chief Complaint  Patient presents with  . Blood Sugar Problem   (Consider location/radiation/quality/duration/timing/severity/associated sxs/prior Treatment) HPI Comments: 82 yo female with a h/o diet controlled diabetes, has been off diabetic medication for months, presents with a c/o anxiety and increased blood sugars over the last several months. Patient was seen in December, one month ago, for the same and instructed to follow up with PCP but has not done so. States her husband died about 5 months ago and patient went to live with her daughter and her family;  however patient states this has been stressful and her anxiety has increased, because "they treat me like a child and I'm not a child".   The history is provided by the patient.     Past Medical History  Diagnosis Date  . Hypertension   . History of hiatal hernia   . IBS (irritable bowel syndrome)   . Anemia   . Diabetes mellitus without complication ([place])     Diet controlled  . Renal cancer ([place])   . Blood clots in brain   . Depression   . Hypercholesteremia   . Seasonal allergies   . Arthritis     right knee  . Headache     sinus  . Wears dentures     full upper and lower  . TIA (transient ischemic attack) 2000    no deficits   Past Surgical History  Procedure Laterality Date  . Appendectomy    . Abdominal hysterectomy    . Brain surgery      for blood clot  . Cholecystectomy    . Cataract extraction w/phaco Right 02/13/2015    Procedure: CATARACT EXTRACTION PHACO AND INTRAOCULAR LENS PLACEMENT (IOC);  Surgeon: Leandrew Koyanagi, MD;  Location: [facility];  Service: Ophthalmology;  Laterality: Right;  DIABETIC  . Cataract extraction w/phaco Left 03/20/2015    Procedure: CATARACT EXTRACTION PHACO AND INTRAOCULAR LENS PLACEMENT (IOC);  Surgeon: Leandrew Koyanagi, MD;  [facility];  Service: Ophthalmology;  Laterality: Left;   History reviewed. No pertinent family history. Social History  Substance Use Topics  . Smoking status: Never Smoker   . Smokeless tobacco: None  . Alcohol Use: No   OB History    No data available     Review of Systems  Allergies  Amoxicillin and Heparin  Home Medications   Prior to Admission medications   Medication Sig Start Date End Date Taking? Authorizing Provider  aspirin 81 MG tablet Take 81 mg by mouth daily. AM   Yes Historical Provider, MD  atorvastatin (LIPITOR) 10 MG tablet Take 10 mg by mouth daily. AM   Yes Historical Provider, MD  buPROPion (WELLBUTRIN XL) 150 MG 24 hr tablet Take 150 mg by mouth daily. AM   Yes Historical Provider, MD  escitalopram (LEXAPRO) 20 MG tablet Take 20 mg by mouth daily. AM   Yes Historical Provider, MD  lisinopril-hydrochlorothiazide (PRINZIDE,ZESTORETIC) 20-12.5 MG per tablet Take 1 tablet by mouth daily. AM   Yes Historical Provider, MD  memantine (NAMENDA) 5 MG tablet Take 5 mg by mouth daily. PM   Yes Historical Provider, MD  multivitamin-iron-minerals-folic acid (CENTRUM) chewable tablet Chew 1 tablet by mouth daily. AM   Yes Historical Provider, MD  vitamin B-12 (CYANOCOBALAMIN) 500 MCG tablet Take 500 mcg by mouth daily. AM  Yes Historical Provider, MD  cefUROXime (CEFTIN) 250 MG tablet Take 1 tablet (250 mg total) by mouth 2 (two) times daily. 09/19/15   Frederich Cha, MD  loratadine-pseudoephedrine (CLARITIN-D 12 HOUR) 5-120 MG tablet Take 1 tablet by mouth 2 (two) times daily. 09/19/15   Frederich Cha, MD  LORazepam (ATIVAN) 0.5 MG tablet Half to one tab po qd prn for anxiety 10/16/15   Norval Gable, MD  meloxicam (MOBIC) 7.5 MG tablet Take 1 tablet (7.5 mg total) by mouth daily. 04/05/15   Lorin Picket, PA-C   Meds Ordered and Administered this Visit  Medications - No data to display  BP 141/60 mmHg  Pulse 69  Temp(Src) 98.3 F (36.8 C) (Oral)  Resp 18   Ht 5\' 8"  (1.727 m)  Wt 128 lb (58.06 kg)  BMI 19.47 kg/m2  SpO2 100% No data found.   Physical Exam  Constitutional: She appears well-developed and well-nourished. No distress.  HENT:  Head: Normocephalic and atraumatic.  Cardiovascular: Normal rate, regular rhythm and normal heart sounds.   Pulmonary/Chest: Effort normal and breath sounds normal. No respiratory distress. She has no wheezes. She has no rales.  Abdominal: Soft. She exhibits no distension.  Skin: No rash noted. She is not diaphoretic.  Nursing note and vitals reviewed.   ED Course  Procedures (including critical care time)  Labs Review Labs Reviewed  GLUCOSE, CAPILLARY - Abnormal; Notable for the following:    Glucose-Capillary 102 (*)    All other components within normal limits    Imaging Review No results found.   Visual Acuity Review  Right Eye Distance:   Left Eye Distance:   Bilateral Distance:    Right Eye Near:   Left Eye Near:    Bilateral Near:         MDM   1. Anxiety    Discharge Medication List as of 10/16/2015  3:41 PM    START taking these medications   Details  LORazepam (ATIVAN) 0.5 MG tablet Half to one tab po qd prn for anxiety, Print       1. Labs/x-ray results and diagnosis reviewed with patient 2. rx as per orders above; reviewed possible side effects, interactions, risks and benefits; discussed with patient use medication   3. Follow-up prn if symptoms worsen or don't improve    Norval Gable, MD 10/16/15 1627

## 2015-10-16 NOTE — ED Notes (Signed)
Patient states that she has been having the shakes and feeling bad for 2 weeks. She states that her Glu was over 200 this am.  Her Glu is 102 presently.

## 2015-11-15 ENCOUNTER — Other Ambulatory Visit: Payer: Self-pay | Admitting: Internal Medicine

## 2015-11-15 DIAGNOSIS — Z1231 Encounter for screening mammogram for malignant neoplasm of breast: Secondary | ICD-10-CM

## 2015-11-18 ENCOUNTER — Ambulatory Visit
Admission: RE | Admit: 2015-11-18 | Discharge: 2015-11-18 | Disposition: A | Discharge status: Home / Self Care | Payer: Medicare Other | Source: Ambulatory Visit | Attending: Internal Medicine | Admitting: Internal Medicine

## 2015-11-18 DIAGNOSIS — Z1231 Encounter for screening mammogram for malignant neoplasm of breast: Secondary | ICD-10-CM | POA: Diagnosis present

## 2015-11-19 DIAGNOSIS — N63 Unspecified lump in unspecified breast: Secondary | ICD-10-CM | POA: Insufficient documentation

## 2015-11-26 ENCOUNTER — Other Ambulatory Visit: Payer: Self-pay | Admitting: Internal Medicine

## 2015-11-26 DIAGNOSIS — N63 Unspecified lump in unspecified breast: Secondary | ICD-10-CM

## 2015-11-28 ENCOUNTER — Ambulatory Visit
Admission: RE | Admit: 2015-11-28 | Discharge: 2015-11-28 | Disposition: A | Discharge status: Home / Self Care | Payer: Medicare Other | Source: Ambulatory Visit | Attending: Internal Medicine | Admitting: Internal Medicine

## 2015-11-28 DIAGNOSIS — N63 Unspecified lump in unspecified breast: Secondary | ICD-10-CM

## 2015-12-10 DIAGNOSIS — M810 Age-related osteoporosis without current pathological fracture: Secondary | ICD-10-CM | POA: Insufficient documentation

## 2016-01-02 ENCOUNTER — Ambulatory Visit (INDEPENDENT_AMBULATORY_CARE_PROVIDER_SITE_OTHER): Payer: 59 | Admitting: Licensed Clinical Social Worker

## 2016-01-02 ENCOUNTER — Encounter: Payer: Self-pay | Admitting: Licensed Clinical Social Worker

## 2016-01-02 DIAGNOSIS — F3342 Major depressive disorder, recurrent, in full remission: Secondary | ICD-10-CM | POA: Diagnosis not present
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
## 2016-01-21 ENCOUNTER — Ambulatory Visit (INDEPENDENT_AMBULATORY_CARE_PROVIDER_SITE_OTHER): Payer: 59 | Admitting: Psychiatry

## 2016-01-21 ENCOUNTER — Encounter: Payer: Self-pay | Admitting: Psychiatry

## 2016-01-21 VITALS — BP 122/68 | HR 83 | Temp 97.7°F | Ht 68.0 in | Wt 127.8 lb

## 2016-01-21 DIAGNOSIS — R4189 Other symptoms and signs involving cognitive functions and awareness: Secondary | ICD-10-CM

## 2016-01-21 DIAGNOSIS — F919 Conduct disorder, unspecified: Secondary | ICD-10-CM

## 2016-01-21 DIAGNOSIS — F4323 Adjustment disorder with mixed anxiety and depressed mood: Secondary | ICD-10-CM | POA: Diagnosis not present

## 2016-01-21 DIAGNOSIS — R4689 Other symptoms and signs involving appearance and behavior: Secondary | ICD-10-CM

## 2016-01-21 MED ORDER — ESCITALOPRAM OXALATE 20 MG PO TABS: 20.0000 mg | ORAL_TABLET | Freq: Every day | ORAL | Status: DC

## 2016-01-21 MED ORDER — MEMANTINE HCL 5 MG PO TABS: 5.0000 mg | ORAL_TABLET | Freq: Every day | ORAL | Status: DC

## 2016-01-21 MED ORDER — BUPROPION HCL ER (XL) 150 MG PO TB24: 150.0000 mg | ORAL_TABLET | Freq: Every day | ORAL | Status: DC

## 2016-01-21 NOTE — Progress Notes (Signed)
Psychiatric Initial Adult Assessment   Patient Identification: Lori Gilmore MRN:  [id number] Date of Evaluation:  01/21/2016 Referral Source: [facility]  Chief Complaint:   Chief Complaint    Follow-up; Medication Refill     Visit Diagnosis:    ICD-9-CM ICD-10-CM   1. Adjustment disorder with mixed anxiety and depressed mood 309.28 F43.23   2. Cognitive and behavioral changes 799.59 R41.89    312.9 F91.9     History of Present Illness:   Patient is a 82 year old widowed female who presented for initial assessment. She was referred by Lori Gilmore. Patient presented for initial assessment as Lori Gilmore is not accepting her a insurance anymore and she has to change her providers due to insurance purposes. Patient reported that she was previously following with Lori Gilmore for 11 years. Ported that she is currently stable on her medications although she had depressive episode after the death of her husband last year. He died due to COPD and was in hospice. Patient reported that now she is living with her daughter as they had to sell their house to pay for his funeral. Patient stated that she wants to stay independent and live in her own place but she cannot afford it. She reported that she tries to do her own chores and drive her own car. She reported that she wants to spend time with her own friends. She stated that she has been doing well on her current medications which were started by Lori Gilmore. She is taking bupropion and Lexapro in the morning and namenda at bedtime. Patient denied having any worsening of her depression. She denied having any suicidal homicidal ideations or plans. She reported that her daughters are very supportive.  Patient stated that she sleeps well at night. She eats well and reported that she feels hungry most of the day. She is not gaining or losing any weight. She has been following with her primary care physician on a regular  basis.  Associated Signs/Symptoms: Depression Symptoms:  depressed mood, difficulty concentrating, impaired memory, anxiety, (Hypo) Manic Symptoms:  none Anxiety Symptoms:  Excessive Worry, Psychotic Symptoms:  none PTSD Symptoms: Negative NA  Past Psychiatric History:   Patient has history of depression and has been following with Lori Gilmore and [facility]. She reported that she has history of multiple psychiatric hospitalizations in the past when her daughter passed away at the age of 44.  Previous Psychotropic Medications: She does not remember the name of her previous medications.  Substance Abuse History in the last 12 months:  No.  Consequences of Substance Abuse: Negative NA  Past Medical History:  Past Medical History  Diagnosis Date  . Hypertension   . History of hiatal hernia   . Anemia   . Diabetes mellitus without complication ([place])     Diet controlled  . Blood clots in brain   . Depression   . Hypercholesteremia   . Seasonal allergies   . Arthritis     right knee  . Headache     sinus  . Wears dentures     full upper and lower  . TIA (transient ischemic attack) 2000    no deficits  . Renal cancer [facility])     right kidney    Past Surgical History  Procedure Laterality Date  . Appendectomy    . Abdominal hysterectomy    . Brain surgery      for blood clot  . Cholecystectomy    . Cataract  extraction w/phaco Right 02/13/2015    Procedure: CATARACT EXTRACTION PHACO AND INTRAOCULAR LENS PLACEMENT (IOC);  Surgeon: Leandrew Koyanagi, MD;  Location: [place];  Service: Ophthalmology;  Laterality: Right;  DIABETIC  . Cataract extraction w/phaco Left 03/20/2015    Procedure: CATARACT EXTRACTION PHACO AND INTRAOCULAR LENS PLACEMENT (IOC);  Surgeon: Leandrew Koyanagi, MD;  Location: [place];  Service: Ophthalmology;  Laterality: Left;  . Breast biopsy Left 03/21/13    u/s bx-neg  . Breast biopsy Left     neg  . Breast  cyst aspiration Left     neg    Family Psychiatric History:  Pt denied   Family History:  Family History  Problem Relation Age of Onset  . Diabetes Daughter     Social History:   Social History   Social History  . Marital Status: Married    Spouse Name: N/A  . Number of Children: N/A  . Years of Education: N/A   Social History Main Topics  . Smoking status: Never Smoker   . Smokeless tobacco: Never Used  . Alcohol Use: No  . Drug Use: No  . Sexual Activity: No   Other Topics Concern  . None   Social History Narrative    Additional Social History:  Patient is currently widowed. She is living with her daughter.  Allergies:   Allergies  Allergen Reactions  . Erythromycin Other (See Comments)  . Oxycodone-Acetaminophen Nausea And Vomiting  . Tapentadol Other (See Comments)  . Amoxicillin Nausea Only  . Ezetimibe     Other reaction(s): Unknown  . Gabapentin Other (See Comments)    dizzy  . Glucosamine-Chondroitin     Other reaction(s): Unknown  . Lovastatin Other (See Comments)  . Omeprazole Nausea Only  . Azithromycin Nausea Only  . Erythromycin Base Rash  . Heparin Rash and Other (See Comments)  . Other Rash    Metabolic Disorder Labs: No results found for: HGBA1C, MPG No results found for: PROLACTIN Lab Results  Component Value Date   CHOL 122 04/20/2013   TRIG 19 04/20/2013   HDL 66* 04/20/2013   VLDL 4* 04/20/2013   LDLCALC 52 04/20/2013   LDLCALC 57 12/13/2011     Current Medications: Current Outpatient Prescriptions  Medication Sig Dispense Refill  . ACCU-CHEK SOFTCLIX LANCETS lancets     . aspirin 81 MG tablet Take 81 mg by mouth daily. AM    . atorvastatin (LIPITOR) 10 MG tablet Take 10 mg by mouth daily. AM    . buPROPion (WELLBUTRIN XL) 150 MG 24 hr tablet Take 150 mg by mouth daily. AM    . Calcium Carb-Cholecalciferol (CALCIUM-VITAMIN D) 500-400 MG-UNIT TABS Take by mouth.    . cefdinir (OMNICEF) 300 MG capsule take 1 capsule by  mouth twice a day for 10 days  0  . escitalopram (LEXAPRO) 20 MG tablet Take 20 mg by mouth daily. AM    . lisinopril-hydrochlorothiazide (PRINZIDE,ZESTORETIC) 20-12.5 MG per tablet Take 1 tablet by mouth daily. AM    . memantine (NAMENDA) 5 MG tablet Take 5 mg by mouth daily. PM    . Multiple Vitamin (THERA) TABS Take by mouth.    . multivitamin-iron-minerals-folic acid (CENTRUM) chewable tablet Chew 1 tablet by mouth daily. AM    . vitamin B-12 (CYANOCOBALAMIN) 500 MCG tablet Take 500 mcg by mouth daily. AM     No current facility-administered medications for this visit.    Neurologic: Headache: No Seizure: No Paresthesias:No  Musculoskeletal: Strength & Muscle  Tone: within normal limits Gait & Station: normal Patient leans: N/A  Psychiatric Specialty Exam: ROS  Blood pressure 122/68, pulse 83, temperature 97.7 F (36.5 C), temperature source Tympanic, height 5\' 8"  (1.727 m), weight 127 lb 12.8 oz (57.97 kg), SpO2 98 %.Body mass index is 19.44 kg/(m^2).  General Appearance: Fairly Groomed  Eye Contact:  Fair  Speech:  Clear and Coherent and Normal Rate  Volume:  Normal  Mood:  Euthymic  Affect:  Appropriate and Congruent  Thought Process:  Coherent and Goal Directed  Orientation:  Full (Time, Place, and Person)  Thought Content:  WDL  Suicidal Thoughts:  No  Homicidal Thoughts:  No  Memory:  Immediate;   Fair  Judgement:  Fair  Insight:  Fair  Psychomotor Activity:  Normal  Concentration:  Fair  Recall:  [employer] of Knowledge:Fair  Language: Fair  Akathisia:  No  Handed:  Right  AIMS (if indicated):    Assets:  Communication Skills Desire for Improvement Physical Health Social Support  ADL's:  Intact  Cognition: WNL  Sleep:      Treatment Plan Summary: Medication management    Discussed with patient about her medications treatment risk benefits and alternatives. She will continue on Wellbutrin XL 150mg   by mouth daily. She will continue on Namenda 5  mg daily.  We'll obtain her records from [facility]  Follow-up in 2 months or earlier depending on her symptoms   More than 50% of the time spent in psychoeducation, counseling and coordination of care.    This note was generated in part or whole with voice recognition software. Voice regonition is usually quite accurate but there are transcription errors that can and very often do occur. I apologize for any typographical errors that were not detected and corrected.    Rainey Pines, MD 5/2/20173:25 PM

## 2016-02-18 ENCOUNTER — Ambulatory Visit: Payer: Medicare Other | Admitting: Psychiatry

## 2016-03-17 ENCOUNTER — Ambulatory Visit (INDEPENDENT_AMBULATORY_CARE_PROVIDER_SITE_OTHER): Payer: 59 | Admitting: Psychiatry

## 2016-03-17 ENCOUNTER — Encounter: Payer: Self-pay | Admitting: Psychiatry

## 2016-03-17 VITALS — BP 142/76 | HR 67 | Temp 97.2°F | Ht 68.0 in | Wt 128.8 lb

## 2016-03-17 DIAGNOSIS — R4189 Other symptoms and signs involving cognitive functions and awareness: Secondary | ICD-10-CM | POA: Diagnosis not present

## 2016-03-17 DIAGNOSIS — F3342 Major depressive disorder, recurrent, in full remission: Secondary | ICD-10-CM

## 2016-03-17 DIAGNOSIS — R4689 Other symptoms and signs involving appearance and behavior: Secondary | ICD-10-CM

## 2016-03-17 DIAGNOSIS — F919 Conduct disorder, unspecified: Secondary | ICD-10-CM

## 2016-03-17 MED ORDER — ESCITALOPRAM OXALATE 20 MG PO TABS: 20.0000 mg | ORAL_TABLET | Freq: Every day | ORAL | Status: DC

## 2016-03-17 MED ORDER — BUPROPION HCL ER (XL) 150 MG PO TB24: 150.0000 mg | ORAL_TABLET | Freq: Every day | ORAL | Status: DC

## 2016-03-17 MED ORDER — MEMANTINE HCL 10 MG PO TABS: 10.0000 mg | ORAL_TABLET | Freq: Every day | ORAL | Status: DC

## 2016-03-17 NOTE — Progress Notes (Signed)
Psychiatric MD Progress Note  Patient Identification: Lori Gilmore MRN:  [id number] Date of Evaluation:  03/17/2016 Referral Source: [facility]  Chief Complaint:   Chief Complaint    Follow-up; Medication Refill     Visit Diagnosis:    ICD-9-CM ICD-10-CM   1. Recurrent major depressive disorder, in full remission ([place]) 296.36 F33.42   2. Cognitive and behavioral changes 799.59 R41.89    312.9 F91.9     History of Present Illness:   Patient is a 82 year old widowed female who presented for Follow-up. She was referred by [facility]. Patient presented for initial assessment as Lori Gilmore is not accepting her a insurance anymore and she has to change her providers due to insurance purposes. Patient reported that she was previously following with Dr. Bridgett Gilmore for 11 years. Ported that she is currently stable on her medications and has noticed improvement in her symptoms. She reported that she is currently living with her daughter and has been taking her medications as prescribed. Patient reported that she tries to stay independent in her own place and has been cleaning her room and her bathroom. She also will go out for shopping and will cook her own food. She will drive to the church on Sundays. She appeared calm and alert during the interview. She reported that [name] is helping with her memory. She is not experiencing any side effects from the medications. She appears calm and collective during the interview. She denied having any suicidal ideations or plans. She reported that she continues to miss her husband who has passed away approximately 15 months ago.     Patient stated that she sleeps well at night. She eats well and reported that she feels hungry most of the day. She is not gaining or losing any weight. She has been following with her primary care physician on a regular basis.  Associated Signs/Symptoms: Depression Symptoms:  depressed mood, difficulty  concentrating, impaired memory, anxiety, (Hypo) Manic Symptoms:  none Anxiety Symptoms:  Excessive Worry, Psychotic Symptoms:  none PTSD Symptoms: Negative NA  Past Psychiatric History:   Patient has history of depression and has been following with Dr. Bridgett Gilmore and [facility]. She reported that she has history of multiple psychiatric hospitalizations in the past when her daughter passed away at the age of 24.  Previous Psychotropic Medications: She does not remember the name of her previous medications.  Substance Abuse History in the last 12 months:  No.  Consequences of Substance Abuse: Negative NA  Past Medical History:  Past Medical History  Diagnosis Date  . Hypertension   . History of hiatal hernia   . Anemia   . Diabetes mellitus without complication ([place])     Diet controlled  . Blood clots in brain   . Depression   . Hypercholesteremia   . Seasonal allergies   . Arthritis     right knee  . Headache     sinus  . Wears dentures     full upper and lower  . TIA (transient ischemic attack) 2000    no deficits  . Renal cancer [facility])     right kidney    Past Surgical History  Procedure Laterality Date  . Appendectomy    . Abdominal hysterectomy    . Brain surgery      for blood clot  . Cholecystectomy    . Cataract extraction w/phaco Right 02/13/2015    Procedure: CATARACT EXTRACTION PHACO AND INTRAOCULAR LENS PLACEMENT (IOC);  Surgeon:  Leandrew Koyanagi, MD;  Location: [place];  Service: Ophthalmology;  Laterality: Right;  DIABETIC  . Cataract extraction w/phaco Left 03/20/2015    Procedure: CATARACT EXTRACTION PHACO AND INTRAOCULAR LENS PLACEMENT (IOC);  Surgeon: Leandrew Koyanagi, MD;  Location: [place];  Service: Ophthalmology;  Laterality: Left;  . Breast biopsy Left 03/21/13    u/s bx-neg  . Breast biopsy Left     neg  . Breast cyst aspiration Left     neg    Family Psychiatric History:  Pt denied   Family  History:  Family History  Problem Relation Age of Onset  . Diabetes Daughter     Social History:   Social History   Social History  . Marital Status: Married    Spouse Name: N/A  . Number of Children: N/A  . Years of Education: N/A   Social History Main Topics  . Smoking status: Never Smoker   . Smokeless tobacco: Never Used  . Alcohol Use: No  . Drug Use: No  . Sexual Activity: No   Other Topics Concern  . None   Social History Narrative    Additional Social History:  Patient is currently widowed. She is living with her daughter.  Allergies:   Allergies  Allergen Reactions  . Erythromycin Other (See Comments)  . Oxycodone-Acetaminophen Nausea And Vomiting  . Oxycodone-Acetaminophen Nausea And Vomiting  . Tapentadol Other (See Comments)  . Amoxicillin Nausea Only  . Ezetimibe     Other reaction(s): Unknown  . Gabapentin Other (See Comments)    dizzy  . Glucosamine-Chondroitin     Other reaction(s): Unknown  . Lovastatin Other (See Comments)  . Omeprazole Nausea Only  . Azithromycin Nausea Only  . Erythromycin Base Rash  . Heparin Rash and Other (See Comments)  . Other Rash    Metabolic Disorder Labs: No results found for: HGBA1C, MPG No results found for: PROLACTIN Lab Results  Component Value Date   CHOL 122 04/20/2013   TRIG 19 04/20/2013   HDL 66* 04/20/2013   VLDL 4* 04/20/2013   LDLCALC 52 04/20/2013   LDLCALC 57 12/13/2011     Current Medications: Current Outpatient Prescriptions  Medication Sig Dispense Refill  . ACCU-CHEK SOFTCLIX LANCETS lancets     . aspirin 81 MG tablet Take 81 mg by mouth daily. AM    . atorvastatin (LIPITOR) 10 MG tablet Take 10 mg by mouth daily. AM    . buPROPion (WELLBUTRIN XL) 150 MG 24 hr tablet Take 1 tablet (150 mg total) by mouth daily. AM 30 tablet 1  . Calcium Carb-Cholecalciferol (CALCIUM-VITAMIN D) 500-400 MG-UNIT TABS Take by mouth.    . cefdinir (OMNICEF) 300 MG capsule take 1 capsule by mouth twice  a day for 10 days  0  . escitalopram (LEXAPRO) 20 MG tablet Take 1 tablet (20 mg total) by mouth daily. AM 30 tablet 1  . lisinopril-hydrochlorothiazide (PRINZIDE,ZESTORETIC) 20-12.5 MG per tablet Take 1 tablet by mouth daily. AM    . memantine (NAMENDA) 5 MG tablet Take 1 tablet (5 mg total) by mouth daily. PM 30 tablet 1  . Multiple Vitamin (THERA) TABS Take by mouth.    . multivitamin-iron-minerals-folic acid (CENTRUM) chewable tablet Chew 1 tablet by mouth daily. AM    . vitamin B-12 (CYANOCOBALAMIN) 500 MCG tablet Take 500 mcg by mouth daily. AM     No current facility-administered medications for this visit.    Neurologic: Headache: No Seizure: No Paresthesias:No  Musculoskeletal: Strength & Muscle  Tone: within normal limits Gait & Station: normal Patient leans: N/A  Psychiatric Specialty Exam: Review of Systems  Musculoskeletal: Positive for back pain and neck pain.  Neurological: Positive for tingling.  Endo/Heme/Allergies: Positive for environmental allergies.  Psychiatric/Behavioral: Positive for depression. The patient is nervous/anxious.   All other systems reviewed and are negative.   Blood pressure 142/76, pulse 67, temperature 97.2 F (36.2 C), temperature source Tympanic, height 5\' 8"  (1.727 m), weight 128 lb 12.8 oz (58.423 kg), SpO2 96 %.Body mass index is 19.59 kg/(m^2).  General Appearance: Fairly Groomed  Eye Contact:  Fair  Speech:  Clear and Coherent and Normal Rate  Volume:  Normal  Mood:  Euthymic  Affect:  Appropriate and Congruent  Thought Process:  Coherent and Goal Directed  Orientation:  Full (Time, Place, and Person)  Thought Content:  WDL  Suicidal Thoughts:  No  Homicidal Thoughts:  No  Memory:  Immediate;   Fair  Judgement:  Fair  Insight:  Fair  Psychomotor Activity:  Normal  Concentration:  Fair  Recall:  [employer] of Knowledge:Fair  Language: Fair  Akathisia:  No  Handed:  Right  AIMS (if indicated):    Assets:  Communication  Skills Desire for Improvement Physical Health Social Support  ADL's:  Intact  Cognition: WNL  Sleep:      Treatment Plan Summary: Medication management    Discussed with patient about her medications treatment risk benefits and alternatives. She will continue on Wellbutrin XL 150mg   by mouth daily and Lexapro 20 mg daily She will continue on Namenda 10 mg daily.   Follow-up in 2 months or earlier depending on her symptoms   More than 50% of the time spent in psychoeducation, counseling and coordination of care.    This note was generated in part or whole with voice recognition software. Voice regonition is usually quite accurate but there are transcription errors that can and very often do occur. I apologize for any typographical errors that were not detected and corrected.    Rainey Pines, MD 6/27/201710:26 AM

## 2016-03-28 ENCOUNTER — Ambulatory Visit (INDEPENDENT_AMBULATORY_CARE_PROVIDER_SITE_OTHER)
Admission: EM | Admit: 2016-03-28 | Discharge: 2016-03-28 | Disposition: A | Discharge status: Hospice | Payer: Medicare Other | Source: Home / Self Care | Attending: Family Medicine | Admitting: Family Medicine

## 2016-03-28 ENCOUNTER — Emergency Department: Payer: Medicare Other

## 2016-03-28 ENCOUNTER — Encounter: Payer: Self-pay | Admitting: *Deleted

## 2016-03-28 ENCOUNTER — Emergency Department
Admission: EM | Admit: 2016-03-28 | Discharge: 2016-03-28 | Disposition: A | Discharge status: Home / Self Care | Payer: Medicare Other | Attending: Student | Admitting: Student

## 2016-03-28 ENCOUNTER — Encounter: Payer: Self-pay | Admitting: Emergency Medicine

## 2016-03-28 DIAGNOSIS — Z85528 Personal history of other malignant neoplasm of kidney: Secondary | ICD-10-CM | POA: Insufficient documentation

## 2016-03-28 DIAGNOSIS — F3342 Major depressive disorder, recurrent, in full remission: Secondary | ICD-10-CM | POA: Insufficient documentation

## 2016-03-28 DIAGNOSIS — I129 Hypertensive chronic kidney disease with stage 1 through stage 4 chronic kidney disease, or unspecified chronic kidney disease: Secondary | ICD-10-CM | POA: Insufficient documentation

## 2016-03-28 DIAGNOSIS — Z79899 Other long term (current) drug therapy: Secondary | ICD-10-CM

## 2016-03-28 DIAGNOSIS — S46812A Strain of other muscles, fascia and tendons at shoulder and upper arm level, left arm, initial encounter: Secondary | ICD-10-CM | POA: Insufficient documentation

## 2016-03-28 DIAGNOSIS — W19XXXA Unspecified fall, initial encounter: Secondary | ICD-10-CM | POA: Insufficient documentation

## 2016-03-28 DIAGNOSIS — Z7982 Long term (current) use of aspirin: Secondary | ICD-10-CM

## 2016-03-28 DIAGNOSIS — Z8673 Personal history of transient ischemic attack (TIA), and cerebral infarction without residual deficits: Secondary | ICD-10-CM | POA: Insufficient documentation

## 2016-03-28 DIAGNOSIS — S4992XA Unspecified injury of left shoulder and upper arm, initial encounter: Secondary | ICD-10-CM | POA: Diagnosis present

## 2016-03-28 DIAGNOSIS — M79602 Pain in left arm: Secondary | ICD-10-CM

## 2016-03-28 DIAGNOSIS — Y929 Unspecified place or not applicable: Secondary | ICD-10-CM | POA: Diagnosis not present

## 2016-03-28 DIAGNOSIS — E1122 Type 2 diabetes mellitus with diabetic chronic kidney disease: Secondary | ICD-10-CM | POA: Insufficient documentation

## 2016-03-28 DIAGNOSIS — F329 Major depressive disorder, single episode, unspecified: Secondary | ICD-10-CM

## 2016-03-28 DIAGNOSIS — Y999 Unspecified external cause status: Secondary | ICD-10-CM | POA: Diagnosis not present

## 2016-03-28 DIAGNOSIS — M1711 Unilateral primary osteoarthritis, right knee: Secondary | ICD-10-CM | POA: Insufficient documentation

## 2016-03-28 DIAGNOSIS — E119 Type 2 diabetes mellitus without complications: Secondary | ICD-10-CM

## 2016-03-28 DIAGNOSIS — R6883 Chills (without fever): Secondary | ICD-10-CM

## 2016-03-28 DIAGNOSIS — N183 Chronic kidney disease, stage 3 (moderate): Secondary | ICD-10-CM | POA: Insufficient documentation

## 2016-03-28 DIAGNOSIS — M25512 Pain in left shoulder: Secondary | ICD-10-CM

## 2016-03-28 DIAGNOSIS — E7800 Pure hypercholesterolemia, unspecified: Secondary | ICD-10-CM | POA: Insufficient documentation

## 2016-03-28 DIAGNOSIS — Y939 Activity, unspecified: Secondary | ICD-10-CM | POA: Diagnosis not present

## 2016-03-28 DIAGNOSIS — M542 Cervicalgia: Secondary | ICD-10-CM

## 2016-03-28 DIAGNOSIS — I10 Essential (primary) hypertension: Secondary | ICD-10-CM | POA: Insufficient documentation

## 2016-03-28 LAB — URINALYSIS COMPLETE WITH MICROSCOPIC (ARMC ONLY)
BACTERIA UA: NONE SEEN
Bilirubin Urine: NEGATIVE
Glucose, UA: NEGATIVE mg/dL
KETONES UR: NEGATIVE mg/dL
Nitrite: NEGATIVE
PH: 5 (ref 5.0–8.0)
PROTEIN: NEGATIVE mg/dL
Specific Gravity, Urine: 1.014 (ref 1.005–1.030)

## 2016-03-28 LAB — COMPREHENSIVE METABOLIC PANEL
ALBUMIN: 3.6 g/dL (ref 3.5–5.0)
ALK PHOS: 64 U/L (ref 38–126)
ALT: 12 U/L — ABNORMAL LOW (ref 14–54)
AST: 22 U/L (ref 15–41)
Anion gap: 6 (ref 5–15)
BILIRUBIN TOTAL: 0.8 mg/dL (ref 0.3–1.2)
BUN: 25 mg/dL — AB (ref 6–20)
CALCIUM: 9.1 mg/dL (ref 8.9–10.3)
CO2: 28 mmol/L (ref 22–32)
Chloride: 103 mmol/L (ref 101–111)
Creatinine, Ser: 1.09 mg/dL — ABNORMAL HIGH (ref 0.44–1.00)
GFR calc Af Amer: 54 mL/min — ABNORMAL LOW (ref >60)
GFR calc non Af Amer: 47 mL/min — ABNORMAL LOW (ref >60)
GLUCOSE: 129 mg/dL — AB (ref 65–99)
Potassium: 4.4 mmol/L (ref 3.5–5.1)
Sodium: 137 mmol/L (ref 135–145)
TOTAL PROTEIN: 6.4 g/dL — AB (ref 6.5–8.1)

## 2016-03-28 LAB — CBC WITH DIFFERENTIAL/PLATELET
BASOS ABS: 0 10*3/uL (ref 0–0.1)
BASOS PCT: 1 %
Eosinophils Absolute: 0.1 10*3/uL (ref 0–0.7)
Eosinophils Relative: 1 %
HEMATOCRIT: 32.9 % — AB (ref 35.0–47.0)
HEMOGLOBIN: 11.1 g/dL — AB (ref 12.0–16.0)
Lymphocytes Relative: 29 %
Lymphs Abs: 1.4 10*3/uL (ref 1.0–3.6)
MCH: 32.1 pg (ref 26.0–34.0)
MCHC: 33.7 g/dL (ref 32.0–36.0)
MCV: 95.1 fL (ref 80.0–100.0)
Monocytes Absolute: 0.5 10*3/uL (ref 0.2–0.9)
Monocytes Relative: 11 %
NEUTROS ABS: 2.8 10*3/uL (ref 1.4–6.5)
NEUTROS PCT: 58 %
Platelets: 238 10*3/uL (ref 150–440)
RBC: 3.46 MIL/uL — ABNORMAL LOW (ref 3.80–5.20)
RDW: 13 % (ref 11.5–14.5)
WBC: 4.9 10*3/uL (ref 3.6–11.0)

## 2016-03-28 LAB — TROPONIN I: Troponin I: <0.03 ng/mL (ref <0.03)

## 2016-03-28 LAB — GLUCOSE, CAPILLARY: Glucose-Capillary: 155 mg/dL — ABNORMAL HIGH (ref 65–99)

## 2016-03-28 MED ORDER — ACETAMINOPHEN 500 MG PO TABS
1000.0000 mg | ORAL_TABLET | Freq: Once | ORAL | Status: AC
  Administered 2016-03-28: 1000 mg via ORAL
  Filled 2016-03-28: qty 2

## 2016-03-28 MED ORDER — ASPIRIN 81 MG PO CHEW
243.0000 mg | CHEWABLE_TABLET | Freq: Once | ORAL | Status: AC
  Administered 2016-03-28: 243 mg via ORAL

## 2016-03-28 NOTE — ED Provider Notes (Signed)
CSN: [id number]     Arrival date & time 03/28/16  1044 History   First MD Initiated Contact with Patient 03/28/16 1130     Chief Complaint  Patient presents with  . Chills  . Arm Pain   (Consider location/radiation/quality/duration/timing/severity/associated sxs/prior Treatment) HPI: Patient presents today with symptoms of left arm pain that started last night. Patient also states that she has some left neck pain. She denies any pain with change in movement or position. She denies any recent trauma or injury to the neck or left shoulder/arm. She does admit to a fracture of the left shoulder in the past. She rates the pain right now a 5 out of 10. She states that it goes down all the way to her hand. She also states that she is generally not been feeling well. She is been experiencing chills. She denies any chest pain, shortness of breath, severe headache, lower extremity symptoms, abdominal pain, nausea, vomiting. She has had urinary tract infections in the past but denies any urinary symptoms at this time. She admits to having a history of diabetes which is diet controlled and also a history of CVA in 2000. Her CVA caused her to have left-sided weakness. She denies any increase in his weakness today. Patient did take a 81 mg aspirin earlier today.  Past Medical History  Diagnosis Date  . Hypertension   . History of hiatal hernia   . Anemia   . Diabetes mellitus without complication ([place])     Diet controlled  . Blood clots in brain   . Depression   . Hypercholesteremia   . Seasonal allergies   . Arthritis     right knee  . Headache     sinus  . Wears dentures     full upper and lower  . TIA (transient ischemic attack) 2000    no deficits  . Renal cancer [facility])     right kidney   Past Surgical History  Procedure Laterality Date  . Appendectomy    . Abdominal hysterectomy    . Brain surgery      for blood clot  . Cholecystectomy    . Cataract extraction w/phaco Right 02/13/2015   Procedure: CATARACT EXTRACTION PHACO AND INTRAOCULAR LENS PLACEMENT (IOC);  Surgeon: Leandrew Koyanagi, MD;  Location: [place];  Service: Ophthalmology;  Laterality: Right;  DIABETIC  . Cataract extraction w/phaco Left 03/20/2015    Procedure: CATARACT EXTRACTION PHACO AND INTRAOCULAR LENS PLACEMENT (IOC);  Surgeon: Leandrew Koyanagi, MD;  Location: [place];  Service: Ophthalmology;  Laterality: Left;  . Breast biopsy Left 03/21/13    u/s bx-neg  . Breast biopsy Left     neg  . Breast cyst aspiration Left     neg   Family History  Problem Relation Age of Onset  . Diabetes Daughter    Social History  Substance Use Topics  . Smoking status: Never Smoker   . Smokeless tobacco: Never Used  . Alcohol Use: No   OB History    No data available     Review of Systems: Negative except mentioned above.   Allergies  Erythromycin; Oxycodone-acetaminophen; Oxycodone-acetaminophen; Tapentadol; Amoxicillin; Ezetimibe; Gabapentin; Glucosamine-chondroitin; Lovastatin; Omeprazole; Azithromycin; Erythromycin base; Heparin; and Other  Home Medications   Prior to Admission medications   Medication Sig Start Date End Date Taking? Authorizing Provider  ACCU-CHEK SOFTCLIX LANCETS lancets  03/15/15  Yes Historical Provider, MD  aspirin 81 MG tablet Take 81 mg by mouth daily. AM  Yes Historical Provider, MD  atorvastatin (LIPITOR) 10 MG tablet Take 10 mg by mouth daily. AM   Yes Historical Provider, MD  buPROPion (WELLBUTRIN XL) 150 MG 24 hr tablet Take 1 tablet (150 mg total) by mouth daily. AM 03/17/16  Yes Rainey Pines, MD  Calcium Carb-Cholecalciferol (CALCIUM-VITAMIN D) 500-400 MG-UNIT TABS Take by mouth. 08/22/09  Yes Historical Provider, MD  escitalopram (LEXAPRO) 20 MG tablet Take 1 tablet (20 mg total) by mouth daily. AM 03/17/16  Yes Rainey Pines, MD  lisinopril-hydrochlorothiazide (PRINZIDE,ZESTORETIC) 20-12.5 MG per tablet Take 1 tablet by mouth daily. AM   Yes Historical  Provider, MD  memantine (NAMENDA) 10 MG tablet Take 1 tablet (10 mg total) by mouth daily. PM 03/17/16  Yes Rainey Pines, MD  Multiple Vitamin (THERA) TABS Take by mouth. 05/11/14  Yes Historical Provider, MD  multivitamin-iron-minerals-folic acid (CENTRUM) chewable tablet Chew 1 tablet by mouth daily. AM   Yes Historical Provider, MD  vitamin B-12 (CYANOCOBALAMIN) 500 MCG tablet Take 500 mcg by mouth daily. AM   Yes Historical Provider, MD  cefdinir (OMNICEF) 300 MG capsule take 1 capsule by mouth twice a day for 10 days 01/16/16   Historical Provider, MD   Meds Ordered and Administered this Visit   Medications  aspirin chewable tablet 243 mg (243 mg Oral Given 03/28/16 1143)    BP 145/67 mmHg  Pulse 64  Temp(Src) 98 F (36.7 C) (Oral)  Resp 16  Ht 5\' 8"  (1.727 m)  Wt 128 lb (58.06 kg)  BMI 19.47 kg/m2  SpO2 97% No data found.   Physical Exam   GENERAL: NAD HEENT: no pharyngeal erythema, no exudate RESP: CTA B CARD: RRR MSK: no tenderness to palpation of left shoulder or arm or neck, seems to have FROM of neck and left arm, 4/5 strength of LUE and LLE compared to RUE and RLE NEURO: AAO, slight facial droop on left (old per pt), no facial numbness, no tongue deviation, no slurred speech noted  ED Course  Procedures (including critical care time)  Labs Review Labs Reviewed  GLUCOSE, CAPILLARY - Abnormal; Notable for the following:    Glucose-Capillary 155 (*)    All other components within normal limits    Imaging Review No results found.  EKG shows normal sinus rhythm, no ST elevation or depression noted, P waves present, left anterior fascicular block, left ventricular hypertrophy with repolarization abnormality, agree with print out version of EKG   MDM  A/P: Left neck/shoulder/arm pain (no history of injury and not reproduced with movement), generalized chills/unwell, history of diabetes and CVA in the past - patient has taken 1 baby aspirin and will give her 3 more,  IV placed, 2L O2  started, given patient's symptoms and history would recommend further evaluation and treatment at the ER. Would recommend cardiac enzymes, possible imaging, and labs such as UA. EMS to take patient to the ER.    Paulina Fusi, MD 03/28/16 1159

## 2016-03-28 NOTE — ED Provider Notes (Addendum)
[facility] Emergency Department Provider Note   ____________________________________________  Time seen: Approximately 12:39 PM  I have reviewed the triage vital signs and the nursing notes.   HISTORY  Chief Complaint Arm Pain and Neck Pain    HPI Lori Gilmore is a 82 y.o. female with Gilmore, Lori Gilmore, Lori Gilmore, Lori Gilmore, Lori Gilmore, Lori Gilmore, Lori Gilmore, Lori Gilmore, constant, worse with movement of the left arm/shoulder, no modifying factors. She reports that the pain in her shoulder radiates into the left trapezius and neck and down into the left arm. She denies any chest pain or shortness of breath. She denies any exertional complaints. No abdominal pain, vomiting, diarrhea, fevers or chills. No numbness or weakness in the left arm. She reports she has intermittent had left-sided shoulder pain since fracture many years ago. He was seen at urgent care and referred to the emergency department for further evaluation. She received 325 mg of by mouth aspirin at urgent care. She has had chills but no fevers. She denies any history of coronary artery disease.   Past Medical History  Diagnosis Date  . Gilmore   . History of hiatal hernia   . Anemia   . Lori Gilmore mellitus without complication ([place])     Diet controlled  . Blood clots in brain   . Depression   . Hypercholesteremia   . Seasonal allergies   . Arthritis     right knee  . Headache     sinus  . Wears dentures     full upper and lower  . Lori Gilmore (transient ischemic attack) 2000    no deficits  . Renal cancer [facility])     right kidney    Patient Active Problem List   Diagnosis Date Noted  . Major depressive disorder, recurrent episode, in full remission ([place]) 01/02/2016  . Osteoporosis, post-menopausal 12/10/2015  . Breast lump 11/19/2015  . Other long term (current) drug therapy  05/03/2015  . Mild cognitive disorder 10/31/2014  . Hemorrhage of vagina 06/15/2014  . Severe episode of recurrent major depressive disorder ([place]) 05/08/2014  . History of urinary anomaly 04/30/2014  . Personal history of urinary infection 04/30/2014  . Anemia in chronic illness 04/29/2014  . Absolute anemia 04/29/2014  . Benign essential HTN 04/29/2014  . Chronic kidney disease (CKD), stage III (moderate) 02/06/2014  . B12 deficiency 02/06/2014  . Anxiety and depression 02/06/2014  . Hypercholesterolemia 02/06/2014  . Fall 02/21/2013  . Malaise and fatigue 02/21/2013  . Adynamia 02/21/2013  . Bladder pain 02/20/2013  . Difficult or painful urination 02/20/2013  . Frank hematuria 02/20/2013  . Labial cyst 02/20/2013  . Malignant neoplasm of kidney ([place]) 02/20/2013  . Urge incontinence of urine 02/20/2013  . Renal cell carcinoma ([place]) 02/20/2013  . Lori Gilmore mellitus ([place]) 06/06/2010  . Benign Gilmore 06/06/2010  . Polyneuropathy in diseases classified elsewhere ([place]) 06/06/2010  . Neuropathy ([place]) 06/06/2010  . Mononeuritis 06/06/2010    Past Surgical History  Procedure Laterality Date  . Appendectomy    . Abdominal hysterectomy    . Brain surgery      for blood clot  . Cholecystectomy    . Cataract extraction w/phaco Right 02/13/2015    Procedure: CATARACT EXTRACTION PHACO AND INTRAOCULAR LENS PLACEMENT (IOC);  Surgeon: Leandrew Koyanagi, MD;  Location: [facility];  Service: Ophthalmology;  Laterality: Right;  DIABETIC  . Cataract extraction w/phaco Left 03/20/2015  Procedure: CATARACT EXTRACTION PHACO AND INTRAOCULAR LENS PLACEMENT (IOC);  Surgeon: Leandrew Koyanagi, MD;  Location: [facility];  Service: Ophthalmology;  Laterality: Left;  . Breast biopsy Left 03/21/13    u/s bx-neg  . Breast biopsy Left     neg  . Breast cyst aspiration Left     neg    Current Outpatient Rx  Name  Route  Sig  Dispense  Refill  . ACCU-CHEK SOFTCLIX LANCETS  lancets               . aspirin 81 MG tablet   Oral   Take 81 mg by mouth daily. AM         . atorvastatin (LIPITOR) 10 MG tablet   Oral   Take 10 mg by mouth daily. AM         . buPROPion (WELLBUTRIN XL) 150 MG 24 hr tablet   Oral   Take 1 tablet (150 mg total) by mouth daily. AM   30 tablet   1   . Calcium Carb-Cholecalciferol (CALCIUM-VITAMIN D) 500-400 MG-UNIT TABS   Oral   Take by mouth.         . cefdinir (OMNICEF) 300 MG capsule      take 1 capsule by mouth twice a day for 10 days      0   . escitalopram (LEXAPRO) 20 MG tablet   Oral   Take 1 tablet (20 mg total) by mouth daily. AM   30 tablet   1   . lisinopril-hydrochlorothiazide (PRINZIDE,ZESTORETIC) 20-12.5 MG per tablet   Oral   Take 1 tablet by mouth daily. AM         . memantine (NAMENDA) 10 MG tablet   Oral   Take 1 tablet (10 mg total) by mouth daily. PM   30 tablet   1   . Multiple Vitamin (THERA) TABS   Oral   Take by mouth.         . multivitamin-iron-minerals-folic acid (CENTRUM) chewable tablet   Oral   Chew 1 tablet by mouth daily. AM         . vitamin B-12 (CYANOCOBALAMIN) 500 MCG tablet   Oral   Take 500 mcg by mouth daily. AM           Allergies Erythromycin; Oxycodone-acetaminophen; Oxycodone-acetaminophen; Tapentadol; Amoxicillin; Ezetimibe; Gabapentin; Glucosamine-chondroitin; Lovastatin; Omeprazole; Azithromycin; Erythromycin base; Heparin; and Other  Family History  Problem Relation Age of Gilmore  . Lori Gilmore Daughter     Social History Social History  Substance Use Topics  . Smoking status: Never Smoker   . Smokeless tobacco: Never Used  . Alcohol Use: No    Review of Systems Constitutional: No fever, +chills Eyes: No visual changes. ENT: No sore throat. Cardiovascular: Denies chest pain. Respiratory: Denies shortness of breath. Gastrointestinal: No abdominal pain.  No nausea, no vomiting.  No diarrhea.  No constipation. Genitourinary:  Negative for dysuria. Musculoskeletal: Negative for back pain. Skin: Negative for rash. Neurological: Negative for headaches, focal weakness or numbness.  10-point ROS otherwise negative.  ____________________________________________   PHYSICAL EXAM:  Filed Vitals:   03/28/16 1232 03/28/16 1430 03/28/16 1445  BP: 141/61    Pulse: 91 65   TempSrc: Oral    Resp:  24 15  SpO2: 94% 99%     VITAL SIGNS: ED Triage Vitals  Enc Vitals Group     BP 03/28/16 1232 141/61 mmHg     Pulse Rate 03/28/16 1232 91  Resp --      Temp --      Temp Source 03/28/16 1232 Oral     SpO2 03/28/16 1232 94 %     Weight --      Height --      Head Cir --      Peak Flow --      Pain Score 03/28/16 1233 5     Pain Loc --      Pain Edu? --      Excl. in [place]? --     Constitutional: Alert and oriented. Well appearing and in no acute distress. Eyes: Conjunctivae are normal. PERRL. EOMI. Head: Atraumatic. Nose: No congestion/rhinnorhea. Mouth/Throat: Mucous membranes are moist.  Oropharynx non-erythematous. Neck: No stridor.   Cardiovascular: Normal rate, regular rhythm. Grossly normal heart sounds.  Good peripheral circulation. Respiratory: Normal respiratory effort.  No retractions. Lungs CTAB. Gastrointestinal: Soft and nontender. No distention.  No Lori tenderness. Genitourinary: deferred Musculoskeletal: No lower extremity tenderness nor edema.  No joint effusions. Reproducible tenderness to palpation in the left trapezius muscle. Patient has full painful range of motion at the left shoulder with tenderness throughout the shoulder joints, no warmth or erythema. 2+  left radial pulse. Neurologic:  Normal speech and language. No gross focal neurologic deficits are appreciated. No gait instability. Skin:  Skin is warm, dry and intact. No rash noted. Psychiatric: Mood and affect are normal. Speech and behavior are normal.  ____________________________________________   LABS (all labs ordered  are listed, but only abnormal results are displayed)  Labs Reviewed  CBC WITH DIFFERENTIAL/PLATELET - Abnormal; Notable for the following:    RBC 3.46 (*)    Hemoglobin 11.1 (*)    HCT 32.9 (*)    All other components within normal limits  COMPREHENSIVE METABOLIC PANEL - Abnormal; Notable for the following:    Glucose, Bld 129 (*)    BUN 25 (*)    Creatinine, Ser 1.09 (*)    Total Protein 6.4 (*)    ALT 12 (*)    GFR calc non Af Amer 47 (*)    GFR calc Af Amer 54 (*)    All other components within normal limits  URINALYSIS COMPLETEWITH MICROSCOPIC ([facility] ONLY) - Abnormal; Notable for the following:    Color, Urine YELLOW (*)    APPearance HAZY (*)    Hgb urine dipstick 1+ (*)    Leukocytes, UA TRACE (*)    Squamous Epithelial / LPF 6-30 (*)    All other components within normal limits  TROPONIN I   ____________________________________________  EKG  ED ECG REPORT I, Joanne Gavel, the attending physician, personally viewed and interpreted this ECG.   Date: 03/28/2016  EKG Time: 12:40  Rate: 62  Rhythm: normal sinus rhythm  Axis: normal  Intervals:nonspecific intraventricular conduction delay  ST&T Change: No acute ST elevation MI. No acute ST depression. LVH. Unchanged from February 2016 when accounting for differences in lead placement. ____________________________________________  RADIOLOGY  Left shoulder plain films FINDINGS: Osteopenia. There is chronic deformity of the proximal humerus. No fracture or dislocation. Chondrocalcinosis involving the articular surface of the humeral head is noted.  IMPRESSION: No acute bony pathology. Chronic changes per  CXR IMPRESSION: Moderate-sized hiatal hernia.  COPD changes without infiltrate.  Stable RIGHT upper lobe nodular focus.  Aortic atherosclerosis.  ____________________________________________   PROCEDURES  Procedure(s) performed: None  Procedures  Critical Care performed:  No  ____________________________________________   INITIAL IMPRESSION / ASSESSMENT AND PLAN / ED COURSE  Pertinent labs & imaging results that were available during my care of the patient were reviewed by me and considered in my medical decision making (see chart for details).  TRACE HANKES is a 82 y.o. female with Gilmore, Lori Gilmore, Lori Gilmore, Lori Gilmore, Lori Gilmore, Lori Gilmore, Lori Gilmore. No chest pain, difficulty breathing or exertional complaints. On exam she is very well-appearing and in no acute distress. Her vital signs are stable, she is afebrile. She was brought in EMS on oxygen though she has no oxygen requirements, O2 sat is 94% on room air. EKG is reassuring, not consistent with acute ischemia, is essentially unchanged from prior. She has reproducible tenderness to palpation in the left trapezius muscles and grimaces every time I push on the area. She has pain with range of motion of the left shoulder and I suspect her pain is muscular skeletal in nature. I have a very low suspicion for acute cardiac pathology but will obtain screening labs, chest x-ray, urinalysis, treat her pain and reassess for disposition.  ----------------------------------------- 2:50 PM on 03/28/2016 ----------------------------------------- Patient is resting comfortably in no acute distress, she reports improvement of her pain at this time. Troponin negative and pain has been constant since last Gilmore. Doubt ACS. Shoulder films show chondrocalcinosis of her humeral head. Chest x-ray shows no acute cardiopulmonary abnormality. Labs are notable for mild anemia, hemoglobin 11.1. Creatinine mildly elevated at 1.09 however this appears to be her baseline. I doubt any acute life-threatening intrathoracic pathology in this patient such as PE, acute aortic dissection, pericarditis. UA negative is a with infection, 6-30 white  blood cell but also many squamous cells, we'll send culture, I discussed that she needs to follow-up with her primary care doctor this week for recheck of her microhematuria. Discussed symptomatic treatments, need for close PCP follow-up and she is comfortable with the discharge plan. DC home. ____________________________________________   FINAL CLINICAL IMPRESSION(S) / ED DIAGNOSES  Final diagnoses:  Shoulder pain, left  Trapezius strain, left, initial encounter      NEW MEDICATIONS STARTED DURING THIS VISIT:  New Prescriptions   No medications on file     Note:  This document was prepared using Dragon voice recognition software and may include unintentional dictation errors.    Joanne Gavel, MD 03/28/16 1453  Joanne Gavel, MD 03/28/16 1459

## 2016-03-28 NOTE — ED Notes (Signed)
Pt from [facility] for arm and neck pain since last night via EMS, pt given 324 asprin. Pt transporte dto ED for further eval. EKG NSR, VS stable. Pt A&O , denies any CP or SOB

## 2016-03-28 NOTE — ED Notes (Addendum)
Pt c/o left arm pain radiating into her neck since last night. . Denies any CP or SOB.Denies any known injuries. Pt denies dizziness any at this time. Pt states she has fell x7-8 times at home the past several months. States she has some dizziness with falls but never LOC. Pt A&O

## 2016-03-28 NOTE — ED Notes (Signed)
Patient transported to X-ray 

## 2016-03-28 NOTE — ED Notes (Addendum)
Nasal O2 at 2 LPM

## 2016-03-28 NOTE — ED Notes (Signed)
Sudden onset left arm pain last night approx 2200. Pain involves entire left arm with no changes in pain r/t position, movement, etc. Rated at 5/10. Also gen body chills last night but otherwise denies any other symptoms. Also denies recent injury to arm.

## 2016-03-30 LAB — URINE CULTURE

## 2016-04-02 DIAGNOSIS — M5030 Other cervical disc degeneration, unspecified cervical region: Secondary | ICD-10-CM | POA: Insufficient documentation

## 2016-04-08 DIAGNOSIS — Z9189 Other specified personal risk factors, not elsewhere classified: Secondary | ICD-10-CM

## 2016-04-08 DIAGNOSIS — Z2839 Other underimmunization status: Secondary | ICD-10-CM | POA: Insufficient documentation

## 2016-04-28 ENCOUNTER — Other Ambulatory Visit: Payer: Self-pay | Admitting: Psychiatry

## 2016-05-13 ENCOUNTER — Ambulatory Visit (INDEPENDENT_AMBULATORY_CARE_PROVIDER_SITE_OTHER): Payer: 59 | Admitting: Psychiatry

## 2016-05-13 VITALS — BP 158/69 | HR 71 | Resp 16 | Ht 68.0 in | Wt 128.4 lb

## 2016-05-13 DIAGNOSIS — F3342 Major depressive disorder, recurrent, in full remission: Secondary | ICD-10-CM | POA: Diagnosis not present

## 2016-05-13 DIAGNOSIS — F919 Conduct disorder, unspecified: Secondary | ICD-10-CM

## 2016-05-13 DIAGNOSIS — R4189 Other symptoms and signs involving cognitive functions and awareness: Secondary | ICD-10-CM | POA: Diagnosis not present

## 2016-05-13 DIAGNOSIS — R4689 Other symptoms and signs involving appearance and behavior: Secondary | ICD-10-CM

## 2016-05-13 MED ORDER — MEMANTINE HCL 10 MG PO TABS: 10.0000 mg | ORAL_TABLET | Freq: Every day | ORAL | 3 refills | Status: DC

## 2016-05-13 MED ORDER — BUPROPION HCL ER (XL) 150 MG PO TB24: 150.0000 mg | ORAL_TABLET | Freq: Every day | ORAL | 3 refills | Status: DC

## 2016-05-13 MED ORDER — ESCITALOPRAM OXALATE 20 MG PO TABS: 20.0000 mg | ORAL_TABLET | Freq: Every day | ORAL | 2 refills | Status: DC

## 2016-05-13 NOTE — Progress Notes (Signed)
Psychiatric MD Progress Note  Patient Identification: Lori Gilmore MRN:  [id number] Date of Evaluation:  05/13/2016 Referral Source: [facility]  Chief Complaint:    Visit Diagnosis:    ICD-9-CM ICD-10-CM   1. Recurrent major depressive disorder, in full remission ([place]) 296.36 F33.42   2. Cognitive and behavioral changes 799.59 R41.89    312.9 F91.9     History of Present Illness:   Patient is a 82 year old widowed female who presented for Follow-up. She was referred by [facility]. Patient presented for follow up. Patient reported that she was previously following with Dr. Bridgett Larsson for 11 years. Ported that she is currently stable on her medications and has noticed improvement in her symptoms. She  appeared calm and happy at this time. She stated that she enjoys living with her daughter and is a very happy person. 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 She spends time cooking and has been doing different activities at home. She reported that she came with her grandsons girlfriend who is very friendly. Patient was very talkative and happy during the interview. She reported that her husband passed away 16 months ago and they were married together for the past 32 years. She still misses him.  She currently denied having any adverse effects of the medications. She appeared calm and collected during the interview.   Patient stated that she sleeps well at night. She eats well and reported that she feels hungry most of the day. She is not gaining or losing any weight. She has been following with her primary care physician on a regular basis.  Associated Signs/Symptoms: Depression Symptoms:  depressed mood, difficulty concentrating, impaired memory, anxiety, (Hypo) Manic Symptoms:  none Anxiety Symptoms:  Excessive Worry, Psychotic Symptoms:  none PTSD Symptoms: Negative NA  Past Psychiatric History:   Patient has history of depression  and has been following with Dr. Bridgett Larsson and [facility]. She reported that she has history of multiple psychiatric hospitalizations in the past when her daughter passed away at the age of 99.  Previous Psychotropic Medications: She does not remember the name of her previous medications.  Substance Abuse History in the last 12 months:  No.  Consequences of Substance Abuse: Negative NA  Past Medical History:  Past Medical History:  Diagnosis Date  . Anemia   . Arthritis    right knee  . Blood clots in brain   . Depression   . Diabetes mellitus without complication ([place])    Diet controlled  . Headache    sinus  . History of hiatal hernia   . Hypercholesteremia   . Hypertension   . Renal cancer ([place])    right kidney  . Seasonal allergies   . TIA (transient ischemic attack) 2000   no deficits  . Wears dentures    full upper and lower    Past Surgical History:  Procedure Laterality Date  . ABDOMINAL HYSTERECTOMY    . APPENDECTOMY    . BRAIN SURGERY     for blood clot  . BREAST BIOPSY Left 03/21/13   u/s bx-neg  . BREAST BIOPSY Left    neg  . BREAST CYST ASPIRATION Left    neg  . CATARACT EXTRACTION W/PHACO Right 02/13/2015   Procedure: CATARACT EXTRACTION PHACO AND INTRAOCULAR LENS PLACEMENT (IOC);  Surgeon: Leandrew Koyanagi, MD;  Location: [facility];  Service: Ophthalmology;  Laterality: Right;  DIABETIC  . CATARACT EXTRACTION W/PHACO Left 03/20/2015  Procedure: CATARACT EXTRACTION PHACO AND INTRAOCULAR LENS PLACEMENT (IOC);  Surgeon: Leandrew Koyanagi, MD;  Location: [facility];  Service: Ophthalmology;  Laterality: Left;  . CHOLECYSTECTOMY      Family Psychiatric History:  Pt denied   Family History:  Family History  Problem Relation Age of Onset  . Diabetes Daughter     Social History:   Social History   Social History  . Marital status: Married    Spouse name: N/A  . Number of children: N/A  . Years of education: N/A    Social History Main Topics  . Smoking status: Never Smoker  . Smokeless tobacco: Never Used  . Alcohol use No  . Drug use: No  . Sexual activity: No   Other Topics Concern  . Not on file   Social History Narrative  . No narrative on file    Additional Social History:  Patient is currently widowed. She is living with her daughter.  Allergies:   Allergies  Allergen Reactions  . Erythromycin Other (See Comments)  . Oxycodone-Acetaminophen Nausea And Vomiting  . Oxycodone-Acetaminophen Nausea And Vomiting  . Tapentadol Other (See Comments)  . Amoxicillin Nausea Only  . Ezetimibe     Other reaction(s): Unknown  . Gabapentin Other (See Comments)    dizzy  . Glucosamine-Chondroitin     Other reaction(s): Unknown  . Lovastatin Other (See Comments)  . Omeprazole Nausea Only  . Azithromycin Nausea Only  . Erythromycin Base Rash  . Heparin Rash and Other (See Comments)  . Other Rash    Metabolic Disorder Labs: No results found for: HGBA1C, MPG No results found for: PROLACTIN Lab Results  Component Value Date   CHOL 122 04/20/2013   TRIG 19 04/20/2013   HDL 66 (H) 04/20/2013   VLDL 4 (L) 04/20/2013   LDLCALC 52 04/20/2013   LDLCALC 57 12/13/2011     Current Medications: Current Outpatient Prescriptions  Medication Sig Dispense Refill  . ACCU-CHEK SOFTCLIX LANCETS lancets     . aspirin 81 MG tablet Take 81 mg by mouth daily. AM    . atorvastatin (LIPITOR) 10 MG tablet Take 10 mg by mouth daily. AM    . buPROPion (WELLBUTRIN XL) 150 MG 24 hr tablet Take 1 tablet (150 mg total) by mouth daily. AM 30 tablet 3  . Calcium Carb-Cholecalciferol (CALCIUM-VITAMIN D) 500-400 MG-UNIT TABS Take by mouth.    . cefdinir (OMNICEF) 300 MG capsule take 1 capsule by mouth twice a day for 10 days  0  . escitalopram (LEXAPRO) 20 MG tablet Take 1 tablet (20 mg total) by mouth daily. AM 30 tablet 2  . lisinopril-hydrochlorothiazide (PRINZIDE,ZESTORETIC) 20-12.5 MG per tablet Take 1  tablet by mouth daily. AM    . memantine (NAMENDA) 10 MG tablet Take 1 tablet (10 mg total) by mouth daily. PM 30 tablet 3  . Multiple Vitamin (THERA) TABS Take by mouth.    . multivitamin-iron-minerals-folic acid (CENTRUM) chewable tablet Chew 1 tablet by mouth daily. AM    . vitamin B-12 (CYANOCOBALAMIN) 500 MCG tablet Take 500 mcg by mouth daily. AM     No current facility-administered medications for this visit.     Neurologic: Headache: No Seizure: No Paresthesias:No  Musculoskeletal: Strength & Muscle Tone: within normal limits Gait & Station: normal Patient leans: N/A  Psychiatric Specialty Exam: Review of Systems  Musculoskeletal: Positive for back pain and neck pain.  Neurological: Positive for tingling.  Endo/Heme/Allergies: Positive for environmental allergies.  Psychiatric/Behavioral: Positive for depression.  The patient is nervous/anxious.   All other systems reviewed and are negative.   Blood pressure (!) 158/69, pulse 71, resp. rate 16, height 5\' 8"  (1.727 m), weight 128 lb 6.4 oz (58.2 kg).Body mass index is 19.52 kg/m.  General Appearance: Fairly Groomed  Eye Contact:  Fair  Speech:  Clear and Coherent and Normal Rate  Volume:  Normal  Mood:  Euthymic  Affect:  Appropriate and Congruent  Thought Process:  Coherent and Goal Directed  Orientation:  Full (Time, Place, and Person)  Thought Content:  WDL  Suicidal Thoughts:  No  Homicidal Thoughts:  No  Memory:  Immediate;   Fair  Judgement:  Fair  Insight:  Fair  Psychomotor Activity:  Normal  Concentration:  Fair  Recall:  [employer] of Knowledge:Fair  Language: Fair  Akathisia:  No  Handed:  Right  AIMS (if indicated):    Assets:  Communication Skills Desire for Improvement Physical Health Social Support  ADL's:  Intact  Cognition: WNL  Sleep:      Treatment Plan Summary: Medication management    Discussed with patient about her medications treatment risk benefits and alternatives. She  will continue on Wellbutrin XL 150mg   by mouth daily and Lexapro 20 mg daily She will continue on Namenda 10 mg daily.   Follow-up in 2 months or earlier depending on her symptoms   More than 50% of the time spent in psychoeducation, counseling and coordination of care.    This note was generated in part or whole with voice recognition software. Voice regonition is usually quite accurate but there are transcription errors that can and very often do occur. I apologize for any typographical errors that were not detected and corrected.    Rainey Pines, MD 8/23/201710:58 AM

## 2016-06-01 ENCOUNTER — Ambulatory Visit
Admission: EM | Admit: 2016-06-01 | Discharge: 2016-06-01 | Disposition: A | Discharge status: Home / Self Care | Payer: Medicare Other | Attending: Family Medicine | Admitting: Family Medicine

## 2016-06-01 ENCOUNTER — Ambulatory Visit (INDEPENDENT_AMBULATORY_CARE_PROVIDER_SITE_OTHER): Payer: Medicare Other

## 2016-06-01 DIAGNOSIS — R52 Pain, unspecified: Secondary | ICD-10-CM

## 2016-06-01 DIAGNOSIS — S7002XA Contusion of left hip, initial encounter: Secondary | ICD-10-CM

## 2016-06-01 DIAGNOSIS — S9002XA Contusion of left ankle, initial encounter: Secondary | ICD-10-CM

## 2016-06-01 DIAGNOSIS — W19XXXA Unspecified fall, initial encounter: Secondary | ICD-10-CM

## 2016-06-01 DIAGNOSIS — S8002XA Contusion of left knee, initial encounter: Secondary | ICD-10-CM

## 2016-06-01 NOTE — Discharge Instructions (Signed)
Tylenol Arthritis: take 2 tablets three times daily as needed for pain

## 2016-06-01 NOTE — ED Triage Notes (Signed)
Patient fell this morning feeding her bird on the back porch . She fell from a standing position and hit her right elbow on the trash can, and her left leg is sore from the thigh to ankle

## 2016-06-01 NOTE — ED Provider Notes (Signed)
[facility]    CSN: [id number] Arrival date & time: 06/01/16  1125  First Provider Contact:  First MD Initiated Contact with Patient 06/01/16 1158        History   Chief Complaint Chief Complaint  Patient presents with  . Fall    HPI Lori Gilmore is a 82 y.o. female.   82 yo female with a c/o left thigh/hip, knee and ankle pain after falling at home this morning. States was feeding her bird inside her home when she tripped and fell on her left leg, hitting the floor. She also hit her right elbow against a trash can. Denies hitting her head or loss of consciousness.    The history is provided by the patient.    Past Medical History:  Diagnosis Date  . Anemia   . Arthritis    right knee  . Blood clots in brain   . Depression   . Diabetes mellitus without complication ([place])    Diet controlled  . Headache    sinus  . History of hiatal hernia   . Hypercholesteremia   . Hypertension   . Renal cancer ([place])    right kidney  . Seasonal allergies   . TIA (transient ischemic attack) 2000   no deficits  . Wears dentures    full upper and lower    Patient Active Problem List   Diagnosis Date Noted  . Major depressive disorder, recurrent episode, in full remission ([place]) 01/02/2016  . Osteoporosis, post-menopausal 12/10/2015  . Breast lump 11/19/2015  . Other long term (current) drug therapy 05/03/2015  . Mild cognitive disorder 10/31/2014  . Hemorrhage of vagina 06/15/2014  . Severe episode of recurrent major depressive disorder ([place]) 05/08/2014  . History of urinary anomaly 04/30/2014  . Personal history of urinary infection 04/30/2014  . Anemia in chronic illness 04/29/2014  . Absolute anemia 04/29/2014  . Benign essential HTN 04/29/2014  . Chronic kidney disease (CKD), stage III (moderate) 02/06/2014  . B12 deficiency 02/06/2014  . Anxiety and depression 02/06/2014  . Hypercholesterolemia 02/06/2014  . Fall 02/21/2013  . Malaise and fatigue  02/21/2013  . Adynamia 02/21/2013  . Bladder pain 02/20/2013  . Difficult or painful urination 02/20/2013  . Frank hematuria 02/20/2013  . Labial cyst 02/20/2013  . Malignant neoplasm of kidney ([place]) 02/20/2013  . Urge incontinence of urine 02/20/2013  . Renal cell carcinoma ([place]) 02/20/2013  . Diabetes mellitus ([place]) 06/06/2010  . Benign hypertension 06/06/2010  . Polyneuropathy in diseases classified elsewhere ([place]) 06/06/2010  . Neuropathy ([place]) 06/06/2010  . Mononeuritis 06/06/2010    Past Surgical History:  Procedure Laterality Date  . ABDOMINAL HYSTERECTOMY    . APPENDECTOMY    . BRAIN SURGERY     for blood clot  . BREAST BIOPSY Left 03/21/13   u/s bx-neg  . BREAST BIOPSY Left    neg  . BREAST CYST ASPIRATION Left    neg  . CATARACT EXTRACTION W/PHACO Right 02/13/2015   Procedure: CATARACT EXTRACTION PHACO AND INTRAOCULAR LENS PLACEMENT (IOC);  Surgeon: Leandrew Koyanagi, MD;  Location: [place];  Service: Ophthalmology;  Laterality: Right;  DIABETIC  . CATARACT EXTRACTION W/PHACO Left 03/20/2015   Procedure: CATARACT EXTRACTION PHACO AND INTRAOCULAR LENS PLACEMENT (IOC);  Surgeon: Leandrew Koyanagi, MD;  Location: [place];  Service: Ophthalmology;  Laterality: Left;  . CHOLECYSTECTOMY      OB History    No data available       Home Medications  Prior to Admission medications   Medication Sig Start Date End Date Taking? Authorizing Provider  ACCU-CHEK SOFTCLIX LANCETS lancets  03/15/15  Yes Historical Provider, MD  aspirin 81 MG tablet Take 81 mg by mouth daily. AM   Yes Historical Provider, MD  atorvastatin (LIPITOR) 10 MG tablet Take 10 mg by mouth daily. AM   Yes Historical Provider, MD  buPROPion (WELLBUTRIN XL) 150 MG 24 hr tablet Take 1 tablet (150 mg total) by mouth daily. AM 05/13/16  Yes Rainey Pines, MD  Calcium Carb-Cholecalciferol (CALCIUM-VITAMIN D) 500-400 MG-UNIT TABS Take by mouth. 08/22/09  Yes Historical Provider, MD    cefdinir (OMNICEF) 300 MG capsule take 1 capsule by mouth twice a day for 10 days 01/16/16  Yes Historical Provider, MD  escitalopram (LEXAPRO) 20 MG tablet Take 1 tablet (20 mg total) by mouth daily. AM 05/13/16  Yes Rainey Pines, MD  lisinopril-hydrochlorothiazide (PRINZIDE,ZESTORETIC) 20-12.5 MG per tablet Take 1 tablet by mouth daily. AM   Yes Historical Provider, MD  memantine (NAMENDA) 10 MG tablet Take 1 tablet (10 mg total) by mouth daily. PM 05/13/16  Yes Rainey Pines, MD  Multiple Vitamin (THERA) TABS Take by mouth. 05/11/14  Yes Historical Provider, MD  multivitamin-iron-minerals-folic acid (CENTRUM) chewable tablet Chew 1 tablet by mouth daily. AM   Yes Historical Provider, MD  vitamin B-12 (CYANOCOBALAMIN) 500 MCG tablet Take 500 mcg by mouth daily. AM   Yes Historical Provider, MD    Family History Family History  Problem Relation Age of Onset  . Diabetes Daughter     Social History Social History  Substance Use Topics  . Smoking status: Never Smoker  . Smokeless tobacco: Never Used  . Alcohol use No     Allergies   Erythromycin; Oxycodone-acetaminophen; Oxycodone-acetaminophen; Tapentadol; Amoxicillin; Ezetimibe; Gabapentin; Glucosamine-chondroitin; Lovastatin; Omeprazole; Azithromycin; Erythromycin base; Heparin; and Other   Review of Systems Review of Systems   Physical Exam Triage Vital Signs ED Triage Vitals  Enc Vitals Group     BP 06/01/16 1144 120/65     Pulse Rate 06/01/16 1144 69     Resp 06/01/16 1144 18     Temp 06/01/16 1144 98.1 F (36.7 C)     Temp Source 06/01/16 1144 Oral     SpO2 06/01/16 1144 100 %     Weight 06/01/16 1144 128 lb (58.1 kg)     Height 06/01/16 1144 5\' 8"  (1.727 m)     Head Circumference --      Peak Flow --      Pain Score 06/01/16 1146 7     Pain Loc --      Pain Edu? --      Excl. in [place]? --    No data found.   Updated Vital Signs BP 120/65 (BP Location: Left Arm)   Pulse 69   Temp 98.1 F (36.7 C) (Oral)   Resp  18   Ht 5\' 8"  (1.727 m)   Wt 128 lb (58.1 kg)   SpO2 100%   BMI 19.46 kg/m   Visual Acuity Right Eye Distance:   Left Eye Distance:   Bilateral Distance:    Right Eye Near:   Left Eye Near:    Bilateral Near:     Physical Exam  Constitutional: She appears well-developed and well-nourished. No distress.  Musculoskeletal:       Right elbow: Normal.      Left hip: She exhibits tenderness. She exhibits normal range of motion, normal strength, no swelling, no  crepitus, no deformity and no laceration.       Left knee: She exhibits decreased range of motion, swelling (mild) and bony tenderness. She exhibits no effusion, no ecchymosis, no deformity, no laceration, no erythema, normal alignment, no LCL laxity, normal patellar mobility, normal meniscus and no MCL laxity. Tenderness found. Lateral joint line tenderness noted.       Left ankle: She exhibits swelling (mild) and ecchymosis. She exhibits normal range of motion, no deformity, no laceration and normal pulse. Tenderness. Lateral malleolus, medial malleolus and AITFL tenderness found. Achilles tendon normal.  Mild ecchymosis noted on skin over lateral epicondyle, however no tenderness to palpation  Neurological: She is alert.  Skin: She is not diaphoretic.  Nursing note and vitals reviewed.    UC Treatments / Results  Labs (all labs ordered are listed, but only abnormal results are displayed) Labs Reviewed - No data to display  EKG  EKG Interpretation None       Radiology Dg Ankle Complete Left  Result Date: 06/01/2016 CLINICAL DATA:  Fall.  Left ankle pain. EXAM: LEFT ANKLE COMPLETE - 3+ VIEW COMPARISON:  None. FINDINGS: There is no evidence of fracture, dislocation, or joint effusion. There is no evidence of arthropathy or other focal bone abnormality. Soft tissues are unremarkable. IMPRESSION: Negative. Electronically Signed   By: Rolm Baptise M.D.   On: 06/01/2016 12:47   Dg Knee Complete 4 Views Left  Result Date:  06/01/2016 CLINICAL DATA:  [place] with pain. Fell in the home while feeding of blurred. Injury this morning. EXAM: LEFT KNEE - COMPLETE 4+ VIEW COMPARISON:  None. FINDINGS: No evidence of fracture, dislocation, or joint effusion. No evidence of arthropathy or other focal bone abnormality. Soft tissues are unremarkable. IMPRESSION: Negative. Electronically Signed   By: Nelson Chimes M.D.   On: 06/01/2016 12:47   Dg Hip Unilat With Pelvis 2-3 Views Left  Result Date: 06/01/2016 CLINICAL DATA:  [place] at home this morning while feeding of bird. EXAM: DG HIP (WITH OR WITHOUT PELVIS) 2-3V LEFT COMPARISON:  None. FINDINGS: There is no evidence of hip fracture or dislocation. There is no evidence of arthropathy or other focal bone abnormality. IMPRESSION: Negative. Electronically Signed   By: Nelson Chimes M.D.   On: 06/01/2016 12:48    Procedures Procedures (including critical care time)  Medications Ordered in UC Medications - No data to display   Initial Impression / Assessment and Plan / UC Course  I have reviewed the triage vital signs and the nursing notes.  Pertinent labs & imaging results that were available during my care of the patient were reviewed by me and considered in my medical decision making (see chart for details).  Clinical Course      Final Clinical Impressions(s) / UC Diagnoses   Final diagnoses:  Pain  Fall, initial encounter  Contusion of left hip, initial encounter  Knee contusion, left, initial encounter  Ankle contusion, left, initial encounter    New Prescriptions Discharge Medication List as of 06/01/2016  1:17 PM     1. x-ray results (negative) and diagnosis reviewed with patient 2. rx as per orders above; reviewed possible side effects, interactions, risks and benefits  3. Recommend supportive treatment with rest, ice, elevation 4. Follow-up prn if symptoms worsen or don't improve   Norval Gable, MD 06/01/16 1322

## 2016-06-27 ENCOUNTER — Other Ambulatory Visit: Payer: Self-pay | Admitting: Psychiatry

## 2016-07-13 ENCOUNTER — Encounter: Payer: Self-pay | Admitting: Psychiatry

## 2016-07-13 ENCOUNTER — Ambulatory Visit (INDEPENDENT_AMBULATORY_CARE_PROVIDER_SITE_OTHER): Payer: 59 | Admitting: Psychiatry

## 2016-07-13 VITALS — BP 139/57 | HR 66 | Temp 98.3°F | Wt 130.2 lb

## 2016-07-13 DIAGNOSIS — R4189 Other symptoms and signs involving cognitive functions and awareness: Secondary | ICD-10-CM

## 2016-07-13 DIAGNOSIS — F3342 Major depressive disorder, recurrent, in full remission: Secondary | ICD-10-CM | POA: Diagnosis not present

## 2016-07-13 DIAGNOSIS — R4689 Other symptoms and signs involving appearance and behavior: Secondary | ICD-10-CM | POA: Diagnosis not present

## 2016-07-13 MED ORDER — BUPROPION HCL ER (XL) 150 MG PO TB24: 150.0000 mg | ORAL_TABLET | Freq: Every day | ORAL | 3 refills | Status: DC

## 2016-07-13 MED ORDER — ESCITALOPRAM OXALATE 20 MG PO TABS: 20.0000 mg | ORAL_TABLET | Freq: Every day | ORAL | 2 refills | Status: DC

## 2016-07-13 MED ORDER — MEMANTINE HCL 10 MG PO TABS: 10.0000 mg | ORAL_TABLET | Freq: Every day | ORAL | 3 refills | Status: DC

## 2016-07-13 NOTE — Progress Notes (Signed)
Psychiatric MD Progress Note  Patient Identification: Lori Gilmore MRN:  [id number] Date of Evaluation:  07/13/2016 Referral Source: [facility]  Chief Complaint:   Chief Complaint    Follow-up; Medication Refill     Visit Diagnosis:    ICD-9-CM ICD-10-CM   1. Recurrent major depressive disorder, in full remission ([place]) 296.36 F33.42   2. Cognitive and behavioral changes 799.59 R41.89    312.9 R46.89     History of Present Illness:   Patient is a 82 year old widowed female who presented for Follow-up. She was referred by [facility]. Patient presented for follow up. Patient reported that she was previously following with Dr. Bridgett Larsson for 11 years. Ported that she is currently stable on her medications and has noticed improvement in her symptoms. She  appeared calm and happy at this time. She stated that she has started gaining weight at this time. Patient reported that she has been compliant with her medications. She denied having any suicidal ideations or plans.  Patient appeared alert during the interview. She reported that she has been living with her daughter and her family and they are keeping an eye on her.  She reported that she takes her medications as prescribed. She currently denied having any adverse effects of the medications. She appeared calm and collected during the interview.   Patient stated that she sleeps well at night. She eats well and reported that she feels hungry most of the day. She is  gaining  weight. She has been following with her primary care physician on a regular basis.  Associated Signs/Symptoms: Depression Symptoms:  difficulty concentrating, impaired memory, anxiety, (Hypo) Manic Symptoms:  none Anxiety Symptoms:  Excessive Worry, Psychotic Symptoms:  none PTSD Symptoms: Negative NA  Past Psychiatric History:   Patient has history of depression and has been following with Dr. Bridgett Larsson and [facility]. She  reported that she has history of multiple psychiatric hospitalizations in the past when her daughter passed away at the age of 49.  Previous Psychotropic Medications: She does not remember the name of her previous medications.  Substance Abuse History in the last 12 months:  No.  Consequences of Substance Abuse: Negative NA  Past Medical History:  Past Medical History:  Diagnosis Date  . Anemia   . Arthritis    right knee  . Blood clots in brain   . Depression   . Diabetes mellitus without complication ([place])    Diet controlled  . Headache    sinus  . History of hiatal hernia   . Hypercholesteremia   . Hypertension   . Renal cancer ([place])    right kidney  . Seasonal allergies   . TIA (transient ischemic attack) 2000   no deficits  . Wears dentures    full upper and lower    Past Surgical History:  Procedure Laterality Date  . ABDOMINAL HYSTERECTOMY    . APPENDECTOMY    . BRAIN SURGERY     for blood clot  . BREAST BIOPSY Left 03/21/13   u/s bx-neg  . BREAST BIOPSY Left    neg  . BREAST CYST ASPIRATION Left    neg  . CATARACT EXTRACTION W/PHACO Right 02/13/2015   Procedure: CATARACT EXTRACTION PHACO AND INTRAOCULAR LENS PLACEMENT (IOC);  Surgeon: Leandrew Koyanagi, MD;  Location: [place];  Service: Ophthalmology;  Laterality: Right;  DIABETIC  . CATARACT EXTRACTION W/PHACO Left 03/20/2015   Procedure: CATARACT EXTRACTION PHACO AND INTRAOCULAR LENS PLACEMENT (IOC);  Surgeon:  Leandrew Koyanagi, MD;  Location: [place];  Service: Ophthalmology;  Laterality: Left;  . CHOLECYSTECTOMY      Family Psychiatric History:  Pt denied   Family History:  Family History  Problem Relation Age of Onset  . Diabetes Daughter     Social History:   Social History   Social History  . Marital status: Married    Spouse name: N/A  . Number of children: N/A  . Years of education: N/A   Social History Main Topics  . Smoking status: Never Smoker  .  Smokeless tobacco: Never Used  . Alcohol use No  . Drug use: No  . Sexual activity: No   Other Topics Concern  . None   Social History Narrative  . None    Additional Social History:  Patient is currently widowed. She is living with her daughter.  Allergies:   Allergies  Allergen Reactions  . Erythromycin Other (See Comments)  . Oxycodone-Acetaminophen Nausea And Vomiting  . Oxycodone-Acetaminophen Nausea And Vomiting  . Tapentadol Other (See Comments)  . Amoxicillin Nausea Only  . Ezetimibe     Other reaction(s): Unknown  . Gabapentin Other (See Comments)    dizzy  . Glucosamine-Chondroitin     Other reaction(s): Unknown  . Lovastatin Other (See Comments)  . Omeprazole Nausea Only  . Azithromycin Nausea Only  . Erythromycin Base Rash  . Heparin Rash and Other (See Comments)  . Other Rash    Metabolic Disorder Labs: No results found for: HGBA1C, MPG No results found for: PROLACTIN Lab Results  Component Value Date   CHOL 122 04/20/2013   TRIG 19 04/20/2013   HDL 66 (H) 04/20/2013   VLDL 4 (L) 04/20/2013   LDLCALC 52 04/20/2013   LDLCALC 57 12/13/2011     Current Medications: Current Outpatient Prescriptions  Medication Sig Dispense Refill  . ACCU-CHEK SOFTCLIX LANCETS lancets     . aspirin 81 MG tablet Take 81 mg by mouth daily. AM    . atorvastatin (LIPITOR) 10 MG tablet Take 10 mg by mouth daily. AM    . buPROPion (WELLBUTRIN XL) 150 MG 24 hr tablet Take 1 tablet (150 mg total) by mouth daily. AM 30 tablet 3  . Calcium Carb-Cholecalciferol (CALCIUM-VITAMIN D) 500-400 MG-UNIT TABS Take by mouth.    . cefdinir (OMNICEF) 300 MG capsule take 1 capsule by mouth twice a day for 10 days  0  . escitalopram (LEXAPRO) 20 MG tablet Take 1 tablet (20 mg total) by mouth daily. AM 30 tablet 2  . lisinopril-hydrochlorothiazide (PRINZIDE,ZESTORETIC) 20-12.5 MG per tablet Take 1 tablet by mouth daily. AM    . memantine (NAMENDA) 10 MG tablet Take 1 tablet (10 mg total)  by mouth daily. PM 30 tablet 3  . memantine (NAMENDA) 5 MG tablet take 1 tablet by mouth every evening 30 tablet 1  . Multiple Vitamin (THERA) TABS Take by mouth.    . multivitamin-iron-minerals-folic acid (CENTRUM) chewable tablet Chew 1 tablet by mouth daily. AM    . vitamin B-12 (CYANOCOBALAMIN) 500 MCG tablet Take 500 mcg by mouth daily. AM     No current facility-administered medications for this visit.     Neurologic: Headache: No Seizure: No Paresthesias:No  Musculoskeletal: Strength & Muscle Tone: within normal limits Gait & Station: normal Patient leans: N/A  Psychiatric Specialty Exam: Review of Systems  Musculoskeletal: Positive for back pain and neck pain.  Neurological: Positive for tingling.  Endo/Heme/Allergies: Positive for environmental allergies.  Psychiatric/Behavioral: Positive for  depression. The patient is nervous/anxious.   All other systems reviewed and are negative.   Blood pressure (!) 139/57, pulse 66, temperature 98.3 F (36.8 C), temperature source Oral, weight 130 lb 3.2 oz (59.1 kg).Body mass index is 19.8 kg/m.  General Appearance: Fairly Groomed  Eye Contact:  Fair  Speech:  Clear and Coherent and Normal Rate  Volume:  Normal  Mood:  Euthymic  Affect:  Appropriate and Congruent  Thought Process:  Coherent and Goal Directed  Orientation:  Full (Time, Place, and Person)  Thought Content:  WDL  Suicidal Thoughts:  No  Homicidal Thoughts:  No  Memory:  Immediate;   Fair  Judgement:  Fair  Insight:  Fair  Psychomotor Activity:  Normal  Concentration:  Fair  Recall:  [employer] of Knowledge:Fair  Language: Fair  Akathisia:  No  Handed:  Right  AIMS (if indicated):    Assets:  Communication Skills Desire for Improvement Physical Health Social Support  ADL's:  Intact  Cognition: WNL  Sleep:      Treatment Plan Summary: Medication management    Discussed with patient about her medications treatment risk benefits and  alternatives. She will continue on Wellbutrin XL 150mg   by mouth daily and Lexapro 20 mg daily She will continue on Namenda 10 mg daily. Patient was given 3 months supply of the medications.  Follow-up in 3 months or earlier depending on her symptoms.  Advised patient that I will be leaving this practice in the end of November and she demonstrated understanding   More than 50% of the time spent in psychoeducation, counseling and coordination of care.    This note was generated in part or whole with voice recognition software. Voice regonition is usually quite accurate but there are transcription errors that can and very often do occur. I apologize for any typographical errors that were not detected and corrected.    Rainey Pines, MD 10/23/20179:58 AM

## 2016-07-17 ENCOUNTER — Ambulatory Visit
Admission: EM | Admit: 2016-07-17 | Discharge: 2016-07-17 | Disposition: A | Discharge status: Home / Self Care | Payer: Medicare Other | Attending: Family Medicine | Admitting: Family Medicine

## 2016-07-17 ENCOUNTER — Encounter: Payer: Self-pay | Admitting: *Deleted

## 2016-07-17 DIAGNOSIS — M7502 Adhesive capsulitis of left shoulder: Secondary | ICD-10-CM

## 2016-07-17 MED ORDER — MELOXICAM 7.5 MG PO TABS: 7.5000 mg | ORAL_TABLET | Freq: Every day | ORAL | 0 refills | Status: DC

## 2016-07-17 NOTE — ED Provider Notes (Signed)
CSN: [id number]     Arrival date & time 07/17/16  1044 History   First MD Initiated Contact with Patient 07/17/16 1141     Chief Complaint  Patient presents with  . Arm Pain   (Consider location/radiation/quality/duration/timing/severity/associated sxs/prior Treatment) HPI  This a 82 year old female who presents with left shoulder pain. Previous fracture of the shoulder but was only able to attend 2 rehabilitation sessions because of insurance problems. She states she tripped over one of her daughter's dogs about 03/28/2016. and injured her left arm. She received treatment here but does she states she is still having pain particularly when she moves her arm away from her side. X-rays were reviewed finding that she had a previous deformity to the humeral head but no acute injury was seen.        Past Medical History:  Diagnosis Date  . Anemia   . Arthritis    right knee  . Blood clots in brain   . Depression   . Diabetes mellitus without complication ([place])    Diet controlled  . Headache    sinus  . History of hiatal hernia   . Hypercholesteremia   . Hypertension   . Renal cancer ([place])    right kidney  . Seasonal allergies   . TIA (transient ischemic attack) 2000   no deficits  . Wears dentures    full upper and lower   Past Surgical History:  Procedure Laterality Date  . ABDOMINAL HYSTERECTOMY    . APPENDECTOMY    . BRAIN SURGERY     for blood clot  . BREAST BIOPSY Left 03/21/13   u/s bx-neg  . BREAST BIOPSY Left    neg  . BREAST CYST ASPIRATION Left    neg  . CATARACT EXTRACTION W/PHACO Right 02/13/2015   Procedure: CATARACT EXTRACTION PHACO AND INTRAOCULAR LENS PLACEMENT (IOC);  Surgeon: Leandrew Koyanagi, MD;  Location: [place];  Service: Ophthalmology;  Laterality: Right;  DIABETIC  . CATARACT EXTRACTION W/PHACO Left 03/20/2015   Procedure: CATARACT EXTRACTION PHACO AND INTRAOCULAR LENS PLACEMENT (IOC);  Surgeon: Leandrew Koyanagi, MD;  Location:  [place];  Service: Ophthalmology;  Laterality: Left;  . CHOLECYSTECTOMY     Family History  Problem Relation Age of Onset  . Diabetes Daughter    Social History  Substance Use Topics  . Smoking status: Never Smoker  . Smokeless tobacco: Never Used  . Alcohol use No   OB History    No data available     Review of Systems  Constitutional: Positive for activity change. Negative for chills, fatigue and fever.  Musculoskeletal: Positive for arthralgias.  All other systems reviewed and are negative.   Allergies  Erythromycin; Oxycodone-acetaminophen; Oxycodone-acetaminophen; Tapentadol; Amoxicillin; Ezetimibe; Gabapentin; Glucosamine-chondroitin; Lovastatin; Omeprazole; Azithromycin; Erythromycin base; Heparin; and Other  Home Medications   Prior to Admission medications   Medication Sig Start Date End Date Taking? Authorizing Provider  aspirin 81 MG tablet Take 81 mg by mouth daily. AM   Yes Historical Provider, MD  atorvastatin (LIPITOR) 10 MG tablet Take 10 mg by mouth daily. AM   Yes Historical Provider, MD  buPROPion (WELLBUTRIN XL) 150 MG 24 hr tablet Take 1 tablet (150 mg total) by mouth daily. AM 07/13/16  Yes Rainey Pines, MD  Calcium Carb-Cholecalciferol (CALCIUM-VITAMIN D) 500-400 MG-UNIT TABS Take by mouth. 08/22/09  Yes Historical Provider, MD  escitalopram (LEXAPRO) 20 MG tablet Take 1 tablet (20 mg total) by mouth daily. AM 07/13/16  Yes Rainey Pines, MD  memantine (NAMENDA) 10 MG tablet Take 1 tablet (10 mg total) by mouth daily. PM 07/13/16  Yes Rainey Pines, MD  Multiple Vitamin (THERA) TABS Take by mouth. 05/11/14  Yes Historical Provider, MD  vitamin B-12 (CYANOCOBALAMIN) 500 MCG tablet Take 500 mcg by mouth daily. AM   Yes Historical Provider, MD  ACCU-CHEK SOFTCLIX LANCETS lancets  03/15/15   Historical Provider, MD  meloxicam (MOBIC) 7.5 MG tablet Take 1 tablet (7.5 mg total) by mouth daily. 07/17/16   Lorin Picket, PA-C   Meds Ordered and  Administered this Visit  Medications - No data to display  BP (!) 138/54 (BP Location: Right Arm)   Pulse 64   Temp 98.3 F (36.8 C) (Oral)   Resp 16   Ht 5\' 8"  (1.727 m)   Wt 130 lb (59 kg)   SpO2 100%   BMI 19.77 kg/m  No data found.   Physical Exam  Constitutional: She is oriented to person, place, and time. She appears well-developed and well-nourished. No distress.  HENT:  Head: Normocephalic and atraumatic.  Eyes: EOM are normal. Pupils are equal, round, and reactive to light.  Neck: Normal range of motion. Neck supple.  Musculoskeletal: She exhibits tenderness. She exhibits no edema or deformity.  Examination of the left shoulder shows no obvious deformity. Patient has difficulty initiating abduction of her shoulder but is able to abduct approximately 20. Passively she has approximately 25-30 of abduction. She has limited external and internal rotation with external rotation of more severely blocked. Causes pain in her shoulder. Elbow range of motion is full and comfortable as is the wrist and fingers. Right shoulder shows 90 of abduction passively and actively. She has some mild tenderness of the subacromial area. There is not any significant tenderness along the humerus and humeral head. Clavicle is nontender as is the acromioclavicular joints.  Neurological: She is alert and oriented to person, place, and time.  Skin: Skin is warm and dry. She is not diaphoretic.  Psychiatric: She has a normal mood and affect. Her behavior is normal. Judgment and thought content normal.  Nursing note and vitals reviewed.   Urgent Care Course   Clinical Course    Procedures (including critical care time)  Labs Review Labs Reviewed - No data to display  Imaging Review No results found.   Visual Acuity Review  Right Eye Distance:   Left Eye Distance:   Bilateral Distance:    Right Eye Near:   Left Eye Near:    Bilateral Near:         MDM   1. Adhesive capsulitis  of left shoulder    Discharge Medication List as of 07/17/2016 12:03 PM    START taking these medications   Details  meloxicam (MOBIC) 7.5 MG tablet Take 1 tablet (7.5 mg total) by mouth daily., Starting Fri 07/17/2016, Normal      Plan: 1. Test/x-ray results and diagnosis reviewed with patient 2. rx as per orders; risks, benefits, potential side effects reviewed with patient 3. Recommend supportive treatment with A limited supply of nonsteroidal anti-inflammatory medications. She  Is to take this with food. Also recommended referral to orthopedics next week to evaluate her for possible injections or manipulation under anesthesia. He was given address and phone number for [facility]. She is planning on going home in talking with her daughter regarding the referral. If she is not doing well she needs to follow-up with her primary care physician. 4. F/u prn if symptoms worsen  or don't improve     Lorin Picket, PA-C 07/17/16 1219

## 2016-07-17 NOTE — ED Triage Notes (Signed)
Patient tripped over her dog 3 weeks ago and injured her left arm. Patient received treatment for her injury, but she is still having pain.

## 2016-07-18 IMAGING — CT CT NECK WITH CONTRAST
3 of 5 series · 13 of 33 positions shown, 16 images · IV contrast (isovue)
Comparison: No priors.

CLINICAL DATA: Neck pain.  Right pharyngeal pain and swelling.

EXAM:
CT NECK WITH CONTRAST
TECHNIQUE: Multidetector CT imaging of the neck was performed using the
standard protocol following the bolus administration of intravenous
contrast.
CONTRAST:  75 mL of Isovue 300.

[Series 5: sag neck · sagittal · 0.68mm/px · 5 of 107 slices shown, 6 images]
[im 36/107  bone]
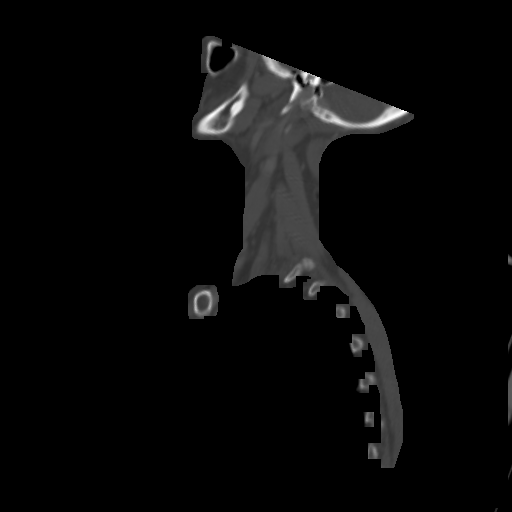
[im 45/107  bone]
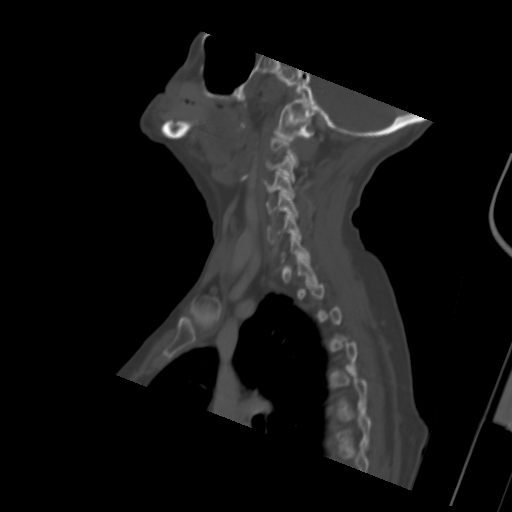
[im 54/107  soft-tissue]
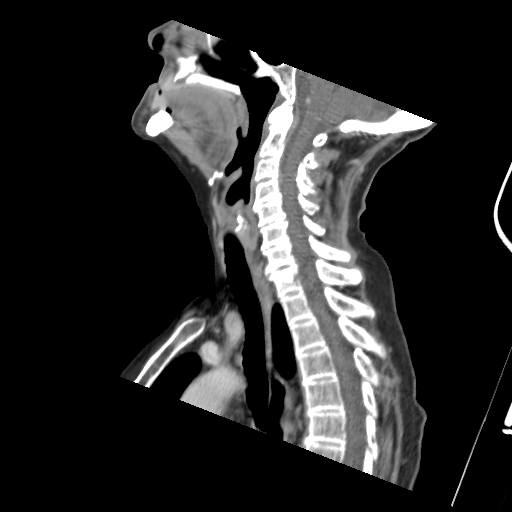
[im 54/107  bone]
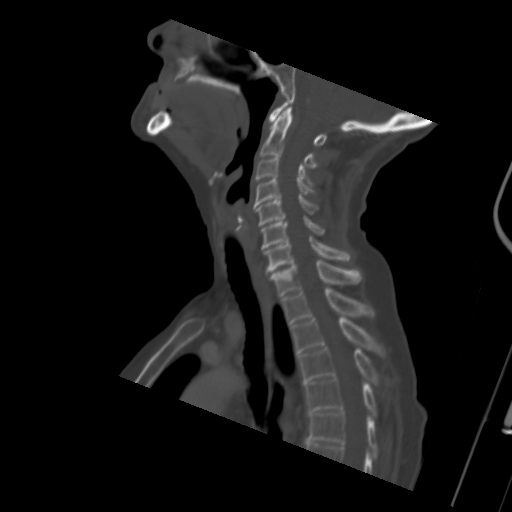
[im 62/107  bone]
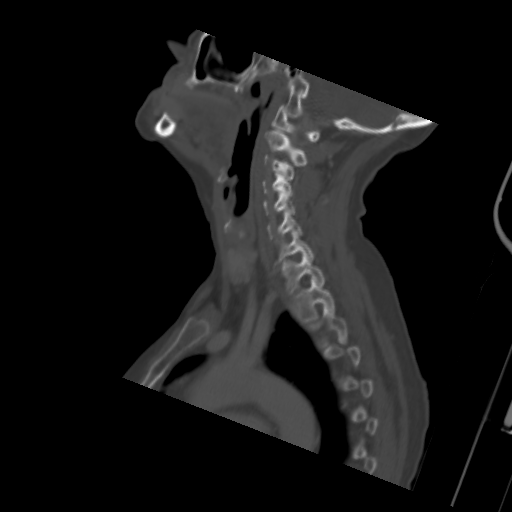
[im 71/107  bone]
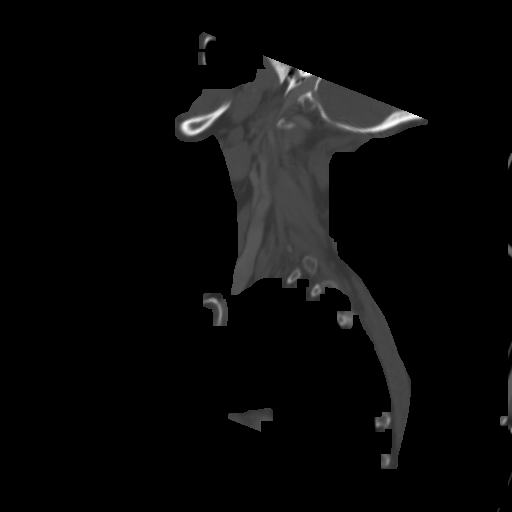

[Series 6: cor neck · coronal · 0.65mm/px · 3 of 95 slices shown]
[im 30/95  bone]
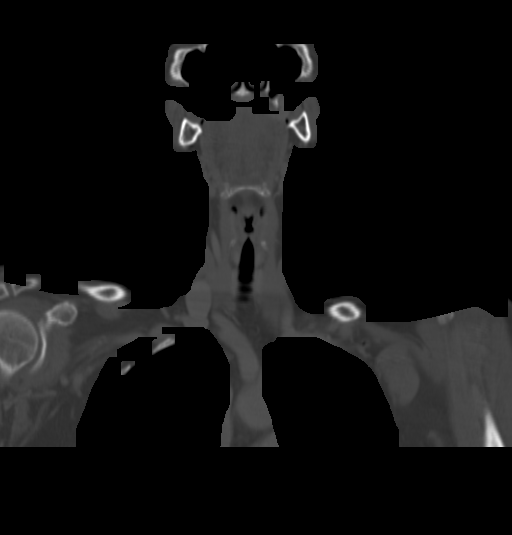
[im 41/95  bone]
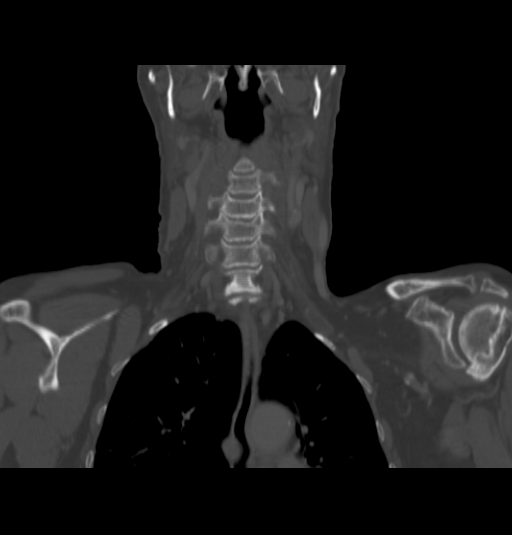
[im 52/95  bone]
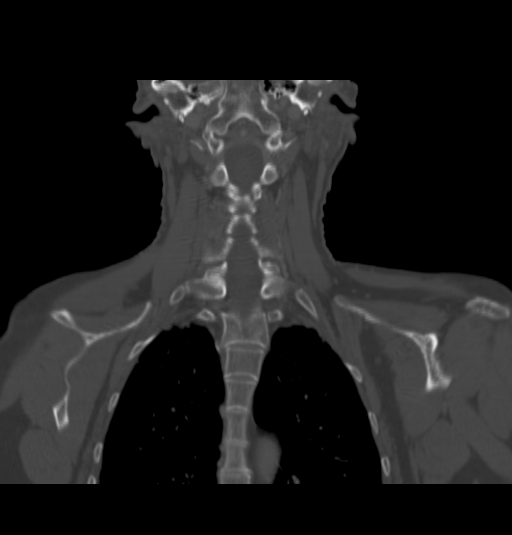

[Series 7: ax oropharynx · axial · 0.52mm/px · z∈[-447,-247]mm · 5 of 176 slices shown, 7 images]
[im 30/176  soft-tissue]
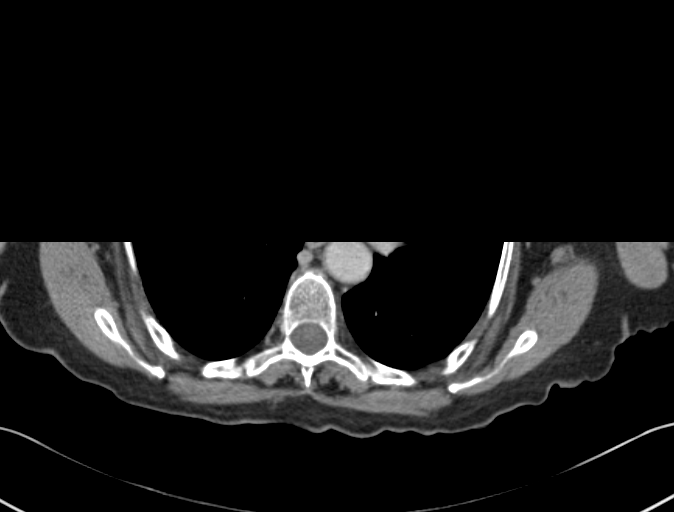
[im 30/176  bone]
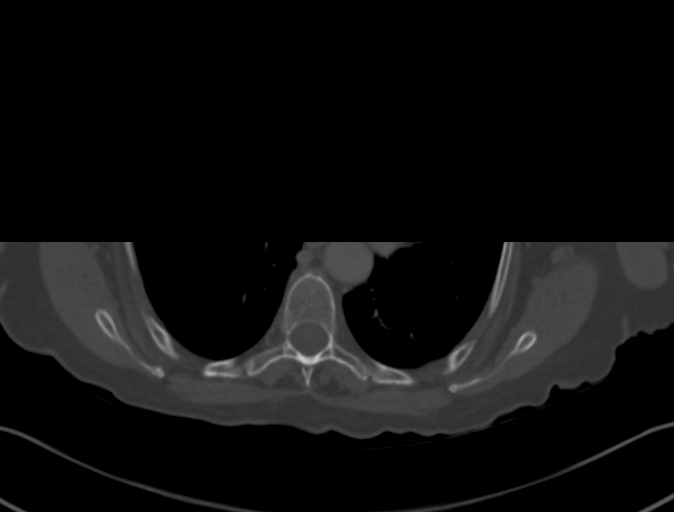
[im 59/176  bone]
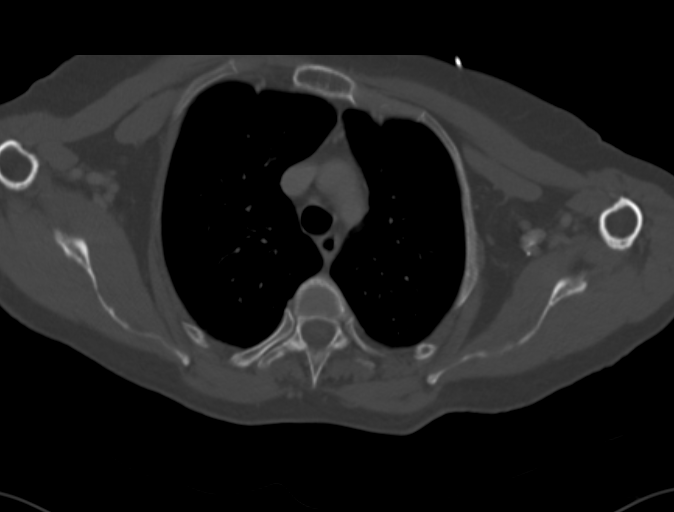
[im 88/176  bone]
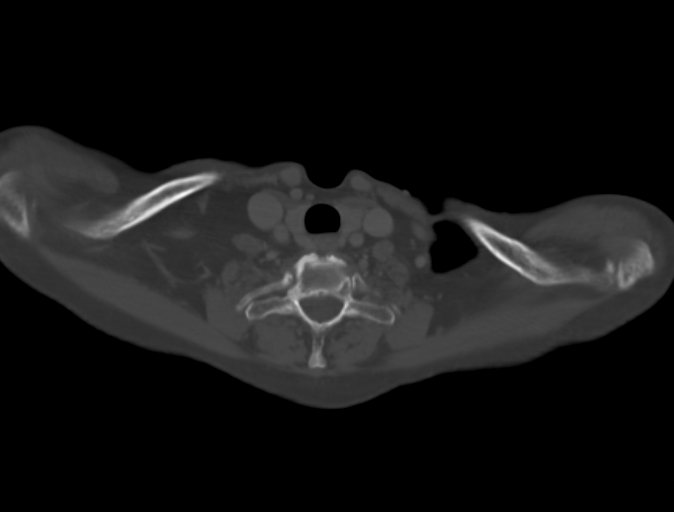
[im 117/176  bone]
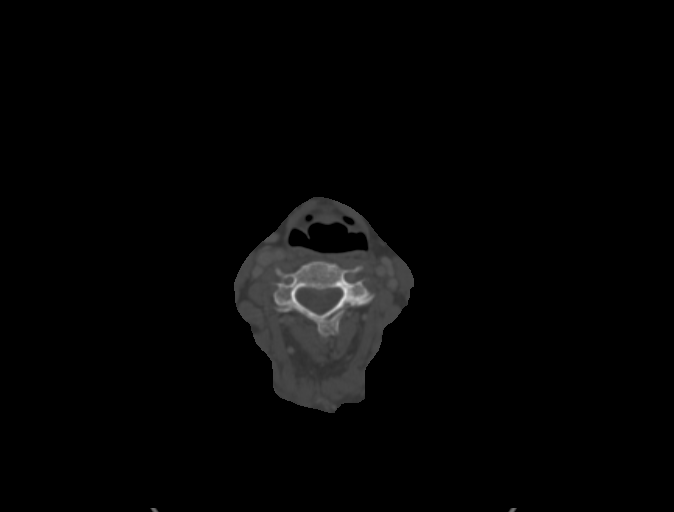
[im 146/176  soft-tissue]
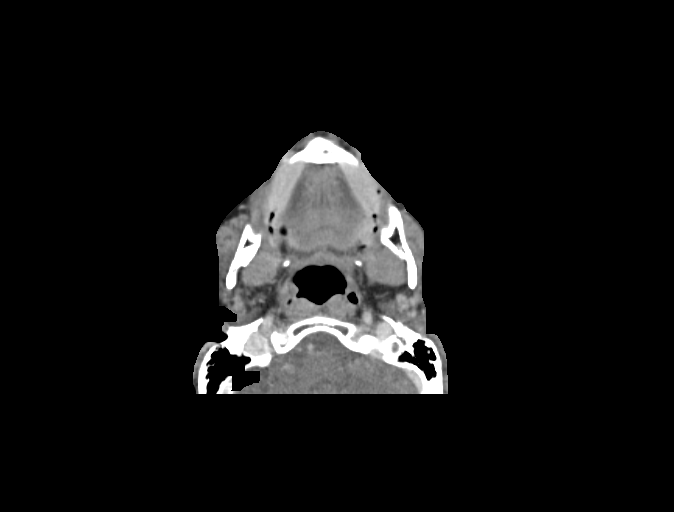
[im 146/176  bone]
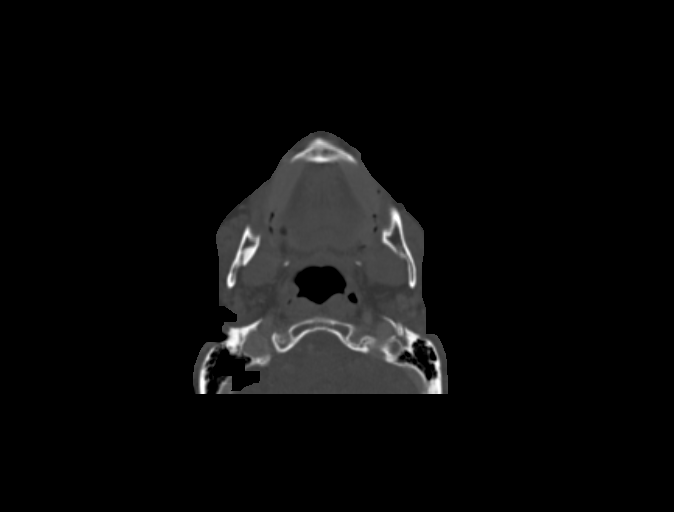

[13 of 33 positions shown; findings below may reference images not displayed]

FINDINGS: The oropharynx and nasopharynx are grossly normal in appearance. No
abnormal retropharyngeal fluid collections are identified to suggest
an abscess at this time. No significant lymphadenopathy identified
in the cervical regions. No soft tissue mass.

Visualized portions of the upper thorax are remarkable for 2 new
pulmonary nodules. One of these measures only 3 mm in the subpleural
aspect of the right upper lobe (image 28 of series 4), and is
favored to represent a subpleural lymph node. However, the other
lesion measures 6 x 6 x 8 mm in the right upper lobe (image 50 of
series 4) and is clearly new compared to prior study 07/26/2013.
There are no aggressive appearing lytic or blastic lesions noted in
the visualized portions of the skeleton. Multilevel degenerative
disc disease in the cervical spine. Large Schmorl's node in the
superior endplate of T1, similar to the prior examination.
IMPRESSION: 1. No acute findings in the neck to account for the patient's
symptoms.
2. Incidental imaging of the upper thorax demonstrates the presence
of 2 new pulmonary nodules, most concerning of which is an 8 x 6 x 6
mm right upper lobe nodule (image 50 of series 4), which was not
present on prior chest CT 07/26/2013. The possibility of a small
neoplasm warrants consideration, although this may alternatively be
an infectious or inflammatory nodule. Repeat chest CT is recommended
in 3 months to assess for the stability or resolution of this
nodule. Given the small size of this nodule, and the possibility
that this could be infectious or inflammatory, further evaluation
with PET imaging is not recommended at this time.
These results were called by telephone at the time of interpretation
on 05/03/2014 at [DATE] to Dr. KOCOUREK PICHL, who verbally
acknowledged these results.

## 2016-07-21 ENCOUNTER — Telehealth: Payer: Self-pay

## 2016-07-21 NOTE — Telephone Encounter (Signed)
Courtesy call back completed today after patients visit at [facility]. Patient improved and will follow up with their PCP if symptoms continue or worsen.   

## 2016-09-09 ENCOUNTER — Encounter: Payer: Self-pay | Admitting: Psychiatry

## 2016-09-09 ENCOUNTER — Ambulatory Visit (INDEPENDENT_AMBULATORY_CARE_PROVIDER_SITE_OTHER): Payer: 59 | Admitting: Psychiatry

## 2016-09-09 VITALS — BP 157/68 | HR 72 | Temp 97.9°F | Wt 126.0 lb

## 2016-09-09 DIAGNOSIS — R4689 Other symptoms and signs involving appearance and behavior: Secondary | ICD-10-CM

## 2016-09-09 DIAGNOSIS — R4189 Other symptoms and signs involving cognitive functions and awareness: Secondary | ICD-10-CM

## 2016-09-09 DIAGNOSIS — F3342 Major depressive disorder, recurrent, in full remission: Secondary | ICD-10-CM | POA: Diagnosis not present

## 2016-09-09 MED ORDER — ESCITALOPRAM OXALATE 20 MG PO TABS: 20.0000 mg | ORAL_TABLET | Freq: Every day | ORAL | 2 refills | Status: DC

## 2016-09-09 MED ORDER — MEMANTINE HCL 10 MG PO TABS: 10.0000 mg | ORAL_TABLET | Freq: Every day | ORAL | 3 refills | Status: DC

## 2016-09-09 MED ORDER — BUPROPION HCL ER (XL) 150 MG PO TB24: 150.0000 mg | ORAL_TABLET | Freq: Every day | ORAL | 3 refills | Status: DC

## 2016-09-09 NOTE — Progress Notes (Signed)
Psychiatric MD Progress Note  Patient Identification: Lori Gilmore MRN:  [id number] Date of Evaluation:  09/09/2016 Referral Source: [facility]  Chief Complaint:   Chief Complaint    Follow-up; Medication Refill     Visit Diagnosis:    ICD-9-CM ICD-10-CM   1. Recurrent major depressive disorder, in full remission ([place]) 296.36 F33.42   2. Cognitive and behavioral changes 799.59 R41.89    312.9 R46.89     History of Present Illness:   Patient is a 82 year old widowed female who presented for Follow-up. She was referred by [facility]. Patient reported that she was previously following with Dr. Bridgett Larsson for 11 years.She stated that she has been doing well on her medications. She stated that she is very excited that I'm still in the office and she is going to continue with me. She stated that she noticed her blood sugar was high yesterday as she ate some cake. She is going to follow with her primary care physician as she is not taking any medications for the same. Patient reported that her family is going to support her during the holidays as her husband passed away 2 years ago. Patient feels that she is depressed around this time of year due to his death. Patient is a client with the medication and feels that they're helping her. She denied having any side effects. She sleeps well at night. She reported that her birthday is also coming up and her family will be planning something. She feels that her daughter and her family is very supportive. She denied having any side effects of the medications. She is compliant with them.   Associated Signs/Symptoms: Depression Symptoms:  difficulty concentrating, impaired memory, anxiety, (Hypo) Manic Symptoms:  none Anxiety Symptoms:  Excessive Worry, Psychotic Symptoms:  none PTSD Symptoms: Negative NA  Past Psychiatric History:   Patient has history of depression and has been following with Dr. Bridgett Larsson and [facility]. She reported that she has history of multiple psychiatric hospitalizations in the past when her daughter passed away at the age of 49.  Previous Psychotropic Medications: She does not remember the name of her previous medications.  Substance Abuse History in the last 12 months:  No.  Consequences of Substance Abuse: Negative NA  Past Medical History:  Past Medical History:  Diagnosis Date  . Anemia   . Arthritis    right knee  . Blood clots in brain   . Depression   . Diabetes mellitus without complication ([place])    Diet controlled  . Headache    sinus  . History of hiatal hernia   . Hypercholesteremia   . Hypertension   . Renal cancer ([place])    right kidney  . Seasonal allergies   . TIA (transient ischemic attack) 2000   no deficits  . Wears dentures    full upper and lower    Past Surgical History:  Procedure Laterality Date  . ABDOMINAL HYSTERECTOMY    . APPENDECTOMY    . BRAIN SURGERY     for blood clot  . BREAST BIOPSY Left 03/21/13   u/s bx-neg  . BREAST BIOPSY Left    neg  . BREAST CYST ASPIRATION Left    neg  . CATARACT EXTRACTION W/PHACO Right 02/13/2015   Procedure: CATARACT EXTRACTION PHACO AND INTRAOCULAR LENS PLACEMENT (IOC);  Surgeon: Leandrew Koyanagi, MD;  Location: [place];  Service: Ophthalmology;  Laterality: Right;  DIABETIC  . CATARACT EXTRACTION W/PHACO Left 03/20/2015  Procedure: CATARACT EXTRACTION PHACO AND INTRAOCULAR LENS PLACEMENT (IOC);  Surgeon: Leandrew Koyanagi, MD;  Location: [place];  Service: Ophthalmology;  Laterality: Left;  . CHOLECYSTECTOMY      Family Psychiatric History:  Pt denied   Family History:  Family History  Problem Relation Age of Onset  . Diabetes Daughter     Social History:   Social History   Social History  . Marital status: Married    Spouse name: N/A  . Number of children: N/A  . Years of education: N/A   Social History Main Topics  . Smoking status:  Never Smoker  . Smokeless tobacco: Never Used  . Alcohol use No  . Drug use: No  . Sexual activity: No   Other Topics Concern  . None   Social History Narrative  . None    Additional Social History:  Patient is currently widowed. She is living with her daughter.  Allergies:   Allergies  Allergen Reactions  . Erythromycin Other (See Comments)  . Oxycodone-Acetaminophen Nausea And Vomiting  . Oxycodone-Acetaminophen Nausea And Vomiting  . Tapentadol Other (See Comments)  . Amoxicillin Nausea Only  . Ezetimibe     Other reaction(s): Unknown  . Gabapentin Other (See Comments)    dizzy  . Glucosamine-Chondroitin     Other reaction(s): Unknown  . Lovastatin Other (See Comments)  . Omeprazole Nausea Only  . Azithromycin Nausea Only  . Erythromycin Base Rash  . Heparin Rash and Other (See Comments)  . Other Rash    Metabolic Disorder Labs: No results found for: HGBA1C, MPG No results found for: PROLACTIN Lab Results  Component Value Date   CHOL 122 04/20/2013   TRIG 19 04/20/2013   HDL 66 (H) 04/20/2013   VLDL 4 (L) 04/20/2013   LDLCALC 52 04/20/2013   LDLCALC 57 12/13/2011     Current Medications: Current Outpatient Prescriptions  Medication Sig Dispense Refill  . ACCU-CHEK SOFTCLIX LANCETS lancets     . aspirin 81 MG tablet Take 81 mg by mouth daily. AM    . atorvastatin (LIPITOR) 10 MG tablet Take 10 mg by mouth daily. AM    . buPROPion (WELLBUTRIN XL) 150 MG 24 hr tablet Take 1 tablet (150 mg total) by mouth daily. AM 90 tablet 3  . Calcium Carb-Cholecalciferol (CALCIUM-VITAMIN D) 500-400 MG-UNIT TABS Take by mouth.    . escitalopram (LEXAPRO) 20 MG tablet Take 1 tablet (20 mg total) by mouth daily. AM 90 tablet 2  . meloxicam (MOBIC) 7.5 MG tablet Take 1 tablet (7.5 mg total) by mouth daily. 14 tablet 0  . memantine (NAMENDA) 10 MG tablet Take 1 tablet (10 mg total) by mouth daily. PM 90 tablet 3  . Multiple Vitamin (THERA) TABS Take by mouth.    . vitamin  B-12 (CYANOCOBALAMIN) 500 MCG tablet Take 500 mcg by mouth daily. AM     No current facility-administered medications for this visit.     Neurologic: Headache: No Seizure: No Paresthesias:No  Musculoskeletal: Strength & Muscle Tone: within normal limits Gait & Station: normal Patient leans: N/A  Psychiatric Specialty Exam: Review of Systems  Musculoskeletal: Positive for back pain and neck pain.  Neurological: Positive for tingling.  Endo/Heme/Allergies: Positive for environmental allergies.  Psychiatric/Behavioral: Positive for depression. The patient is nervous/anxious.   All other systems reviewed and are negative.   Blood pressure (!) 157/68, pulse 72, temperature 97.9 F (36.6 C), temperature source Oral, weight 126 lb (57.2 kg).Body mass index is 19.16 kg/m.  General Appearance: Fairly Groomed  Eye Contact:  Fair  Speech:  Clear and Coherent and Normal Rate  Volume:  Normal  Mood:  Euthymic  Affect:  Appropriate and Congruent  Thought Process:  Coherent and Goal Directed  Orientation:  Full (Time, Place, and Person)  Thought Content:  WDL  Suicidal Thoughts:  No  Homicidal Thoughts:  No  Memory:  Immediate;   Fair  Judgement:  Fair  Insight:  Fair  Psychomotor Activity:  Normal  Concentration:  Fair  Recall:  [employer] of Knowledge:Fair  Language: Fair  Akathisia:  No  Handed:  Right  AIMS (if indicated):    Assets:  Communication Skills Desire for Improvement Physical Health Social Support  ADL's:  Intact  Cognition: WNL  Sleep:      Treatment Plan Summary: Medication management    Discussed with patient about her medications treatment risk benefits and alternatives. She will continue on Wellbutrin XL 150mg   by mouth daily and Lexapro 20 mg daily She will continue on Namenda 10 mg daily. Patient was given 3 months supply of the medications.  Follow-up in 3 months or earlier depending on her symptoms.     More than 50% of the time spent in  psychoeducation, counseling and coordination of care.    This note was generated in part or whole with voice recognition software. Voice regonition is usually quite accurate but there are transcription errors that can and very often do occur. I apologize for any typographical errors that were not detected and corrected.    Rainey Pines, MD 12/20/201710:34 AM

## 2016-09-10 ENCOUNTER — Ambulatory Visit: Payer: Medicare Other | Admitting: Psychiatry

## 2016-09-10 ENCOUNTER — Ambulatory Visit
Admission: EM | Admit: 2016-09-10 | Discharge: 2016-09-10 | Disposition: A | Discharge status: Home / Self Care | Payer: Medicare Other | Attending: Family Medicine | Admitting: Family Medicine

## 2016-09-10 DIAGNOSIS — N189 Chronic kidney disease, unspecified: Secondary | ICD-10-CM | POA: Diagnosis not present

## 2016-09-10 DIAGNOSIS — R739 Hyperglycemia, unspecified: Secondary | ICD-10-CM | POA: Diagnosis not present

## 2016-09-10 DIAGNOSIS — R531 Weakness: Secondary | ICD-10-CM

## 2016-09-10 LAB — URINALYSIS, COMPLETE (UACMP) WITH MICROSCOPIC
Bilirubin Urine: NEGATIVE
GLUCOSE, UA: NEGATIVE mg/dL
Ketones, ur: NEGATIVE mg/dL
LEUKOCYTES UA: NEGATIVE
Nitrite: NEGATIVE
PH: 5.5 (ref 5.0–8.0)
Protein, ur: NEGATIVE mg/dL
SPECIFIC GRAVITY, URINE: 1.02 (ref 1.005–1.030)

## 2016-09-10 LAB — COMPREHENSIVE METABOLIC PANEL
ALBUMIN: 3.9 g/dL (ref 3.5–5.0)
ALK PHOS: 73 U/L (ref 38–126)
ALT: 14 U/L (ref 14–54)
ANION GAP: 7 (ref 5–15)
AST: 20 U/L (ref 15–41)
BILIRUBIN TOTAL: 0.7 mg/dL (ref 0.3–1.2)
BUN: 31 mg/dL — ABNORMAL HIGH (ref 6–20)
CALCIUM: 9.1 mg/dL (ref 8.9–10.3)
CO2: 26 mmol/L (ref 22–32)
Chloride: 100 mmol/L — ABNORMAL LOW (ref 101–111)
Creatinine, Ser: 1.26 mg/dL — ABNORMAL HIGH (ref 0.44–1.00)
GFR, EST AFRICAN AMERICAN: 46 mL/min — AB (ref >60)
GFR, EST NON AFRICAN AMERICAN: 39 mL/min — AB (ref >60)
GLUCOSE: 128 mg/dL — AB (ref 65–99)
Potassium: 4.4 mmol/L (ref 3.5–5.1)
Sodium: 133 mmol/L — ABNORMAL LOW (ref 135–145)
TOTAL PROTEIN: 7.5 g/dL (ref 6.5–8.1)

## 2016-09-10 LAB — CBC WITH DIFFERENTIAL/PLATELET
Basophils Absolute: 0.1 10*3/uL (ref 0–0.1)
Basophils Relative: 1 %
Eosinophils Absolute: 0.1 10*3/uL (ref 0–0.7)
Eosinophils Relative: 1 %
HEMATOCRIT: 35.6 % (ref 35.0–47.0)
HEMOGLOBIN: 11.9 g/dL — AB (ref 12.0–16.0)
LYMPHS ABS: 1.5 10*3/uL (ref 1.0–3.6)
LYMPHS PCT: 17 %
MCH: 31.2 pg (ref 26.0–34.0)
MCHC: 33.4 g/dL (ref 32.0–36.0)
MCV: 93.3 fL (ref 80.0–100.0)
MONOS PCT: 10 %
Monocytes Absolute: 0.9 10*3/uL (ref 0.2–0.9)
NEUTROS ABS: 6.5 10*3/uL (ref 1.4–6.5)
NEUTROS PCT: 71 %
Platelets: 388 10*3/uL (ref 150–440)
RBC: 3.81 MIL/uL (ref 3.80–5.20)
RDW: 13.5 % (ref 11.5–14.5)
WBC: 9.1 10*3/uL (ref 3.6–11.0)

## 2016-09-10 LAB — GLUCOSE, CAPILLARY: GLUCOSE-CAPILLARY: 146 mg/dL — AB (ref 65–99)

## 2016-09-10 NOTE — ED Triage Notes (Signed)
Pt c/o feeling weak and shaky all weak, checked blood sugar here today and its 146. She doesn't currently take any thing for diabetes.

## 2016-09-10 NOTE — ED Provider Notes (Signed)
[facility]    CSN: [id number] Arrival date & time: 09/10/16  1346     History   Chief Complaint Chief Complaint  Patient presents with  . Weakness    Shaky and Weak    HPI Lori Gilmore is a 82 y.o. female.   She is a 82 year old white female who reports pressures been up the last 3 days along with the blood sugars being up she's had a tremor and a shake. She was diagnosed diabetes a short while ago and placed on metformin dose is unknown. She states that as she followed she was eating and taking care of himself is taken off the metformin. She checks her blood sugar on a regular basis and it was over 203 days ago it was slightly over 200 yesterday and today it was 1 8190 range patient became very concerned she's also noticed tremoring and shaking of her hands when her blood sugars are elevated and she states she tried again and see her doctor today but was unable to get in so she came here to be seen and evaluated. She denies any symptoms of a UTI frequency or burning on urination or go to the bathroom often. She denies any cough just overall weakness tremulous and just feeling bad and she never smoked but her husband who passed with about 2 years ago was a smoker. Along with diabetes she's had B12 deficiency kidney disease kidney cancer and neuropathy. Surgery includes abdominal hysterectomy appendectomy brain surgery and breast biopsy she does have a daughter who has diabetes. She has multiple allergies to multiple medications please see list.   The history is provided by the patient. No language interpreter was used.  Weakness  Primary symptoms include dizziness. Primary symptoms comment: Tremulous. This is a new problem. The current episode started more than 2 days ago. The problem has not changed since onset.There has been no fever. Pertinent negatives include no shortness of breath, no chest pain, no vomiting, no altered mental status, no confusion and no headaches.  There were no medications administered prior to arrival. Associated medical issues include trauma. Associated medical issues do not include mood changes, a bleeding disorder, seizures, dementia, CVA or a clotting disorder.    Past Medical History:  Diagnosis Date  . Anemia   . Arthritis    right knee  . Blood clots in brain   . Depression   . Diabetes mellitus without complication ([place])    Diet controlled  . Headache    sinus  . History of hiatal hernia   . Hypercholesteremia   . Hypertension   . Renal cancer ([place])    right kidney  . Seasonal allergies   . TIA (transient ischemic attack) 2000   no deficits  . Wears dentures    full upper and lower    Patient Active Problem List   Diagnosis Date Noted  . Major depressive disorder, recurrent episode, in full remission ([place]) 01/02/2016  . Osteoporosis, post-menopausal 12/10/2015  . Breast lump 11/19/2015  . Other long term (current) drug therapy 05/03/2015  . Mild cognitive disorder 10/31/2014  . Hemorrhage of vagina 06/15/2014  . Severe episode of recurrent major depressive disorder ([place]) 05/08/2014  . History of urinary anomaly 04/30/2014  . Personal history of urinary infection 04/30/2014  . Anemia in chronic illness 04/29/2014  . Absolute anemia 04/29/2014  . Benign essential HTN 04/29/2014  . Chronic kidney disease (CKD), stage III (moderate) 02/06/2014  . B12 deficiency 02/06/2014  .  Anxiety and depression 02/06/2014  . Hypercholesterolemia 02/06/2014  . Fall 02/21/2013  . Malaise and fatigue 02/21/2013  . Adynamia 02/21/2013  . Bladder pain 02/20/2013  . Difficult or painful urination 02/20/2013  . Frank hematuria 02/20/2013  . Labial cyst 02/20/2013  . Malignant neoplasm of kidney ([place]) 02/20/2013  . Urge incontinence of urine 02/20/2013  . Renal cell carcinoma ([place]) 02/20/2013  . Diabetes mellitus ([place]) 06/06/2010  . Benign hypertension 06/06/2010  . Polyneuropathy in diseases classified elsewhere ([place])  06/06/2010  . Neuropathy ([place]) 06/06/2010  . Mononeuritis 06/06/2010    Past Surgical History:  Procedure Laterality Date  . ABDOMINAL HYSTERECTOMY    . APPENDECTOMY    . BRAIN SURGERY     for blood clot  . BREAST BIOPSY Left 03/21/13   u/s bx-neg  . BREAST BIOPSY Left    neg  . BREAST CYST ASPIRATION Left    neg  . CATARACT EXTRACTION W/PHACO Right 02/13/2015   Procedure: CATARACT EXTRACTION PHACO AND INTRAOCULAR LENS PLACEMENT (IOC);  Surgeon: Leandrew Koyanagi, MD;  Location: [facility];  Service: Ophthalmology;  Laterality: Right;  DIABETIC  . CATARACT EXTRACTION W/PHACO Left 03/20/2015   Procedure: CATARACT EXTRACTION PHACO AND INTRAOCULAR LENS PLACEMENT (IOC);  Surgeon: Leandrew Koyanagi, MD;  Location: [facility];  Service: Ophthalmology;  Laterality: Left;  . CHOLECYSTECTOMY      OB History    No data available       Home Medications    Prior to Admission medications   Medication Sig Start Date End Date Taking? Authorizing Provider  ACCU-CHEK SOFTCLIX LANCETS lancets  03/15/15  Yes Historical Provider, MD  aspirin 81 MG tablet Take 81 mg by mouth daily. AM   Yes Historical Provider, MD  atorvastatin (LIPITOR) 10 MG tablet Take 10 mg by mouth daily. AM   Yes Historical Provider, MD  buPROPion (WELLBUTRIN XL) 150 MG 24 hr tablet Take 1 tablet (150 mg total) by mouth daily. AM 09/09/16  Yes Rainey Pines, MD  Calcium Carb-Cholecalciferol (CALCIUM-VITAMIN D) 500-400 MG-UNIT TABS Take by mouth. 08/22/09  Yes Historical Provider, MD  escitalopram (LEXAPRO) 20 MG tablet Take 1 tablet (20 mg total) by mouth daily. AM 09/09/16  Yes Rainey Pines, MD  memantine (NAMENDA) 10 MG tablet Take 1 tablet (10 mg total) by mouth daily. PM 09/09/16  Yes Rainey Pines, MD  Multiple Vitamin (THERA) TABS Take by mouth. 05/11/14  Yes Historical Provider, MD  vitamin B-12 (CYANOCOBALAMIN) 500 MCG tablet Take 500 mcg by mouth daily. AM   Yes Historical Provider, MD  meloxicam  (MOBIC) 7.5 MG tablet Take 1 tablet (7.5 mg total) by mouth daily. 07/17/16   Lorin Picket, PA-C    Family History Family History  Problem Relation Age of Onset  . Diabetes Daughter     Social History Social History  Substance Use Topics  . Smoking status: Never Smoker  . Smokeless tobacco: Never Used  . Alcohol use No     Allergies   Erythromycin; Oxycodone-acetaminophen; Oxycodone-acetaminophen; Tapentadol; Amoxicillin; Ezetimibe; Gabapentin; Glucosamine-chondroitin; Lovastatin; Omeprazole; Azithromycin; Erythromycin base; Heparin; and Other   Review of Systems Review of Systems  Respiratory: Negative for shortness of breath.   Cardiovascular: Negative for chest pain.  Gastrointestinal: Negative for vomiting.  Neurological: Positive for dizziness and weakness. Negative for headaches.  Psychiatric/Behavioral: Negative for confusion.  All other systems reviewed and are negative.    Physical Exam Triage Vital Signs ED Triage Vitals  Enc Vitals Group     BP  09/10/16 1409 (!) 113/51     Pulse Rate 09/10/16 1409 76     Resp 09/10/16 1409 18     Temp 09/10/16 1409 98.6 F (37 C)     Temp Source 09/10/16 1409 Oral     SpO2 09/10/16 1409 98 %     Weight 09/10/16 1407 126 lb (57.2 kg)     Height 09/10/16 1407 5\' 8"  (1.727 m)     Head Circumference --      Peak Flow --      Pain Score --      Pain Loc --      Pain Edu? --      Excl. in [place]? --    No data found.   Updated Vital Signs BP (!) 113/51 (BP Location: Left Arm)   Pulse 76   Temp 98.6 F (37 C) (Oral)   Resp 18   Ht 5\' 8"  (1.727 m)   Wt 126 lb (57.2 kg)   SpO2 98%   BMI 19.16 kg/m   Visual Acuity Right Eye Distance:   Left Eye Distance:   Bilateral Distance:    Right Eye Near:   Left Eye Near:    Bilateral Near:     Physical Exam  Constitutional: She is oriented to person, place, and time. She appears well-developed.  HENT:  Head: Normocephalic and atraumatic.  Right Ear: External ear  normal.  Left Ear: External ear normal.  Eyes: Pupils are equal, round, and reactive to light.  Neck: Normal range of motion. Neck supple.  Cardiovascular: Normal rate, regular rhythm and normal heart sounds.   Pulmonary/Chest: Effort normal and breath sounds normal.  Musculoskeletal: Normal range of motion.  Neurological: She is alert and oriented to person, place, and time.  Patient has mild tremor of hands  Skin: Skin is warm and dry.  Psychiatric: Her mood appears anxious.  Vitals reviewed.    UC Treatments / Results  Labs (all labs ordered are listed, but only abnormal results are displayed) Labs Reviewed  GLUCOSE, CAPILLARY - Abnormal; Notable for the following:       Result Value   Glucose-Capillary 146 (*)    All other components within normal limits  URINALYSIS, COMPLETE (UACMP) WITH MICROSCOPIC - Abnormal; Notable for the following:    Hgb urine dipstick SMALL (*)    Squamous Epithelial / LPF 0-5 (*)    Bacteria, UA FEW (*)    All other components within normal limits  CBC WITH DIFFERENTIAL/PLATELET - Abnormal; Notable for the following:    Hemoglobin 11.9 (*)    All other components within normal limits  COMPREHENSIVE METABOLIC PANEL - Abnormal; Notable for the following:    Sodium 133 (*)    Chloride 100 (*)    Glucose, Bld 128 (*)    BUN 31 (*)    Creatinine, Ser 1.26 (*)    GFR calc non Af Amer 39 (*)    GFR calc Af Amer 46 (*)    All other components within normal limits  URINE CULTURE  HEMOGLOBIN A1C    EKG  EKG Interpretation None       Radiology No results found.  Procedures Procedures (including critical care time)  Medications Ordered in UC Medications - No data to display   Initial Impression / Assessment and Plan / UC Course  I have reviewed the triage vital signs and the nursing notes.  Pertinent labs & imaging results that were available during my care of the patient were  reviewed by me and considered in my medical decision  making (see chart for details).  Clinical Course    I've informed patient were going to get some lab work check a urine and urine culture if the urine is abnormal with cultures abnormal will treat with antibiotics if not we'll place on metformin 500 mg 1 tablet day until she can follow-up with her PCP. We'll obtain A1c which will not be ready today will be available for her and her PCP to review at a later date.   Final Clinical Impressions(s) / UC Diagnoses   Final diagnoses:  Weakness  Elevated blood sugar level  Chronic kidney disease, unspecified CKD stage    New Prescriptions New Prescriptions   No medications on file   Because of her elevated blood sugar has come down to the 120 range and the fact that her creatinine has gone up putting her back on metformin and that I'm concerned about the renal failure patient was informed about that   Frederich Cha, MD 09/10/16 1556

## 2016-09-11 LAB — HEMOGLOBIN A1C
HEMOGLOBIN A1C: 7.7 % — AB (ref 4.8–5.6)
MEAN PLASMA GLUCOSE: 174 mg/dL

## 2016-09-12 ENCOUNTER — Emergency Department
Admission: EM | Admit: 2016-09-12 | Discharge: 2016-09-12 | Disposition: A | Discharge status: Home / Self Care | Payer: Medicare Other | Attending: Emergency Medicine | Admitting: Emergency Medicine

## 2016-09-12 ENCOUNTER — Encounter: Payer: Self-pay | Admitting: Emergency Medicine

## 2016-09-12 DIAGNOSIS — Z85528 Personal history of other malignant neoplasm of kidney: Secondary | ICD-10-CM | POA: Diagnosis not present

## 2016-09-12 DIAGNOSIS — I129 Hypertensive chronic kidney disease with stage 1 through stage 4 chronic kidney disease, or unspecified chronic kidney disease: Secondary | ICD-10-CM | POA: Diagnosis not present

## 2016-09-12 DIAGNOSIS — Z7982 Long term (current) use of aspirin: Secondary | ICD-10-CM | POA: Diagnosis not present

## 2016-09-12 DIAGNOSIS — N183 Chronic kidney disease, stage 3 (moderate): Secondary | ICD-10-CM | POA: Insufficient documentation

## 2016-09-12 DIAGNOSIS — R739 Hyperglycemia, unspecified: Secondary | ICD-10-CM

## 2016-09-12 DIAGNOSIS — E1122 Type 2 diabetes mellitus with diabetic chronic kidney disease: Secondary | ICD-10-CM | POA: Insufficient documentation

## 2016-09-12 DIAGNOSIS — E1165 Type 2 diabetes mellitus with hyperglycemia: Secondary | ICD-10-CM | POA: Diagnosis not present

## 2016-09-12 DIAGNOSIS — Z79899 Other long term (current) drug therapy: Secondary | ICD-10-CM | POA: Insufficient documentation

## 2016-09-12 LAB — CBC WITH DIFFERENTIAL/PLATELET
BASOS ABS: 0.1 10*3/uL (ref 0–0.1)
BASOS PCT: 1 %
EOS ABS: 0.1 10*3/uL (ref 0–0.7)
EOS PCT: 2 %
HCT: 34 % — ABNORMAL LOW (ref 35.0–47.0)
Hemoglobin: 11.8 g/dL — ABNORMAL LOW (ref 12.0–16.0)
Lymphocytes Relative: 27 %
Lymphs Abs: 1.6 10*3/uL (ref 1.0–3.6)
MCH: 31.8 pg (ref 26.0–34.0)
MCHC: 34.6 g/dL (ref 32.0–36.0)
MCV: 92 fL (ref 80.0–100.0)
MONO ABS: 0.6 10*3/uL (ref 0.2–0.9)
Monocytes Relative: 9 %
Neutro Abs: 3.7 10*3/uL (ref 1.4–6.5)
Neutrophils Relative %: 61 %
PLATELETS: 353 10*3/uL (ref 150–440)
RBC: 3.69 MIL/uL — ABNORMAL LOW (ref 3.80–5.20)
RDW: 13.3 % (ref 11.5–14.5)
WBC: 6 10*3/uL (ref 3.6–11.0)

## 2016-09-12 LAB — URINE CULTURE: SPECIAL REQUESTS: NORMAL

## 2016-09-12 LAB — URINALYSIS, COMPLETE (UACMP) WITH MICROSCOPIC
BILIRUBIN URINE: NEGATIVE
GLUCOSE, UA: NEGATIVE mg/dL
Ketones, ur: NEGATIVE mg/dL
LEUKOCYTES UA: NEGATIVE
NITRITE: NEGATIVE
PH: 6 (ref 5.0–8.0)
Protein, ur: NEGATIVE mg/dL
SQUAMOUS EPITHELIAL / LPF: NONE SEEN
Specific Gravity, Urine: 1.01 (ref 1.005–1.030)

## 2016-09-12 LAB — COMPREHENSIVE METABOLIC PANEL
ALBUMIN: 3.7 g/dL (ref 3.5–5.0)
ALK PHOS: 71 U/L (ref 38–126)
ALT: 12 U/L — ABNORMAL LOW (ref 14–54)
AST: 23 U/L (ref 15–41)
Anion gap: 7 (ref 5–15)
BUN: 24 mg/dL — AB (ref 6–20)
CALCIUM: 8.9 mg/dL (ref 8.9–10.3)
CO2: 26 mmol/L (ref 22–32)
Chloride: 101 mmol/L (ref 101–111)
Creatinine, Ser: 1.01 mg/dL — ABNORMAL HIGH (ref 0.44–1.00)
GFR calc Af Amer: 60 mL/min — ABNORMAL LOW (ref >60)
GFR, EST NON AFRICAN AMERICAN: 52 mL/min — AB (ref >60)
GLUCOSE: 173 mg/dL — AB (ref 65–99)
Potassium: 4.3 mmol/L (ref 3.5–5.1)
Sodium: 134 mmol/L — ABNORMAL LOW (ref 135–145)
TOTAL PROTEIN: 6.9 g/dL (ref 6.5–8.1)

## 2016-09-12 LAB — GLUCOSE, CAPILLARY: GLUCOSE-CAPILLARY: 206 mg/dL — AB (ref 65–99)

## 2016-09-12 MED ORDER — SODIUM CHLORIDE 0.9 % IV BOLUS (SEPSIS)
1000.0000 mL | Freq: Once | INTRAVENOUS | Status: AC
  Administered 2016-09-12: 1000 mL via INTRAVENOUS

## 2016-09-12 NOTE — ED Provider Notes (Signed)
Apolonio Schneiders, attending physician, personally viewed and interpreted this EKG  EKG Time: 1736 Rate: 64 Rhythm: normal sinus rhythm Axis: left axis deviation Intervals: qtc 410 QRS: narrow, LVH ST changes: no st elevation Impression: abnormal ekg    Nance Pear, MD 09/12/16 [phone]

## 2016-09-12 NOTE — ED Notes (Addendum)
Pt had all labs completed on 12/21 including A1c at [facility].  Spoke with Dr. Quentin Cornwall, no additional orders needed at this time.

## 2016-09-12 NOTE — ED Provider Notes (Signed)
[facility] Emergency Department Provider Note  ____________________________________________  Time seen: Approximately 7:45 PM  I have reviewed the triage vital signs and the nursing notes.   HISTORY  Chief Complaint Hyperglycemia    HPI Lori Gilmore is a 82 y.o. female who presents emergency department concerned about hypoglycemia, dizziness, weakness. Patient states that she has a history of diabetes that was diet controlled. The patient states that she still checks her blood sugar. For the past 2 weeks, patient is started to feel lightheaded, slightly dizzy, generalized malaise. Patient states that her blood sugars have been running in the 180s up to 200s. Patient denies any change in diet, new medications, signs of infection. Patient tried to contact her primary care and set up an appointment but was unable to make an appointment in a timely manner. She presented tourgent care 2 days prior for same complaint and had a pretty detailed workup. Patient was mildly hyperglycemic but no other significant findings. A1c was ordered but did not return at the time of visit. Patient has had worsening kidney function and was not started on metformin due to renal function.  Patient returns to emergency department today as blood sugars returned over 200. Patient states that she has not noticed any new symptoms to include headache, vision changes, chest pain, shortness of breath, abdominal pain, nausea or vomiting, diarrhea constipation, urinary tract infection symptoms. Patient is just concerned that her blood sugar has once again returned to 200.   Past Medical History:  Diagnosis Date  . Anemia   . Arthritis    right knee  . Blood clots in brain   . Depression   . Diabetes mellitus without complication ([place])    Diet controlled  . Headache    sinus  . History of hiatal hernia   . Hypercholesteremia   . Hypertension   . Renal cancer ([place])    right kidney  .  Seasonal allergies   . TIA (transient ischemic attack) 2000   no deficits  . Wears dentures    full upper and lower    Patient Active Problem List   Diagnosis Date Noted  . Major depressive disorder, recurrent episode, in full remission ([place]) 01/02/2016  . Osteoporosis, post-menopausal 12/10/2015  . Breast lump 11/19/2015  . Other long term (current) drug therapy 05/03/2015  . Mild cognitive disorder 10/31/2014  . Hemorrhage of vagina 06/15/2014  . Severe episode of recurrent major depressive disorder ([place]) 05/08/2014  . History of urinary anomaly 04/30/2014  . Personal history of urinary infection 04/30/2014  . Anemia in chronic illness 04/29/2014  . Absolute anemia 04/29/2014  . Benign essential HTN 04/29/2014  . Chronic kidney disease (CKD), stage III (moderate) 02/06/2014  . B12 deficiency 02/06/2014  . Anxiety and depression 02/06/2014  . Hypercholesterolemia 02/06/2014  . Fall 02/21/2013  . Malaise and fatigue 02/21/2013  . Adynamia 02/21/2013  . Bladder pain 02/20/2013  . Difficult or painful urination 02/20/2013  . Frank hematuria 02/20/2013  . Labial cyst 02/20/2013  . Malignant neoplasm of kidney ([place]) 02/20/2013  . Urge incontinence of urine 02/20/2013  . Renal cell carcinoma ([place]) 02/20/2013  . Diabetes mellitus ([place]) 06/06/2010  . Benign hypertension 06/06/2010  . Polyneuropathy in diseases classified elsewhere ([place]) 06/06/2010  . Neuropathy ([place]) 06/06/2010  . Mononeuritis 06/06/2010    Past Surgical History:  Procedure Laterality Date  . ABDOMINAL HYSTERECTOMY    . APPENDECTOMY    . BRAIN SURGERY     for blood clot  .  BREAST BIOPSY Left 03/21/13   u/s bx-neg  . BREAST BIOPSY Left    neg  . BREAST CYST ASPIRATION Left    neg  . CATARACT EXTRACTION W/PHACO Right 02/13/2015   Procedure: CATARACT EXTRACTION PHACO AND INTRAOCULAR LENS PLACEMENT (IOC);  Surgeon: Leandrew Koyanagi, MD;  Location: [facility];  Service: Ophthalmology;  Laterality:  Right;  DIABETIC  . CATARACT EXTRACTION W/PHACO Left 03/20/2015   Procedure: CATARACT EXTRACTION PHACO AND INTRAOCULAR LENS PLACEMENT (IOC);  Surgeon: Leandrew Koyanagi, MD;  Location: [facility];  Service: Ophthalmology;  Laterality: Left;  . CHOLECYSTECTOMY      Prior to Admission medications   Medication Sig Start Date End Date Taking? Authorizing Provider  ACCU-CHEK SOFTCLIX LANCETS lancets  03/15/15   Historical Provider, MD  aspirin 81 MG tablet Take 81 mg by mouth daily. AM    Historical Provider, MD  atorvastatin (LIPITOR) 10 MG tablet Take 10 mg by mouth daily. AM    Historical Provider, MD  buPROPion (WELLBUTRIN XL) 150 MG 24 hr tablet Take 1 tablet (150 mg total) by mouth daily. AM 09/09/16   Rainey Pines, MD  Calcium Carb-Cholecalciferol (CALCIUM-VITAMIN D) 500-400 MG-UNIT TABS Take by mouth. 08/22/09   Historical Provider, MD  escitalopram (LEXAPRO) 20 MG tablet Take 1 tablet (20 mg total) by mouth daily. AM 09/09/16   Rainey Pines, MD  meloxicam (MOBIC) 7.5 MG tablet Take 1 tablet (7.5 mg total) by mouth daily. 07/17/16   Lorin Picket, PA-C  memantine (NAMENDA) 10 MG tablet Take 1 tablet (10 mg total) by mouth daily. PM 09/09/16   Rainey Pines, MD  Multiple Vitamin (THERA) TABS Take by mouth. 05/11/14   Historical Provider, MD  vitamin B-12 (CYANOCOBALAMIN) 500 MCG tablet Take 500 mcg by mouth daily. AM    Historical Provider, MD    Allergies Erythromycin; Oxycodone-acetaminophen; Oxycodone-acetaminophen; Tapentadol; Amoxicillin; Ezetimibe; Gabapentin; Glucosamine-chondroitin; Lovastatin; Omeprazole; Azithromycin; Erythromycin base; Heparin; and Other  Family History  Problem Relation Age of Onset  . Diabetes Daughter     Social History Social History  Substance Use Topics  . Smoking status: Never Smoker  . Smokeless tobacco: Never Used  . Alcohol use No     Review of Systems  Constitutional: No fever/chills. Concerned about elevated blood sugars. General  malaise. Eyes: No visual changes. No discharge ENT: No upper respiratory complaints. Cardiovascular: no chest pain. Respiratory: no cough. No SOB. Gastrointestinal: No abdominal pain.  No nausea, no vomiting.  No diarrhea.  No constipation. Genitourinary: Negative for dysuria. No hematuria Musculoskeletal: Negative for musculoskeletal pain. Skin: Negative for rash, abrasions, lacerations, ecchymosis. Neurological: Negative for headaches, focal weakness or numbness. 10-point ROS otherwise negative.  ____________________________________________   PHYSICAL EXAM:  VITAL SIGNS: ED Triage Vitals  Enc Vitals Group     BP 09/12/16 1632 (!) 141/66     Pulse Rate 09/12/16 1632 73     Resp 09/12/16 1632 18     Temp 09/12/16 1632 98.1 F (36.7 C)     Temp Source 09/12/16 1632 Oral     SpO2 09/12/16 1632 99 %     Weight 09/12/16 1633 126 lb (57.2 kg)     Height 09/12/16 1633 5\' 8"  (1.727 m)     Head Circumference --      Peak Flow --      Pain Score 09/12/16 1633 0     Pain Loc --      Pain Edu? --      Excl. in [place]? --  Constitutional: Alert and oriented. Well appearing and in no acute distress. Eyes: Conjunctivae are normal. PERRL. EOMI. Head: Atraumatic. Neck: No stridor.   Hematological/Lymphatic/Immunilogical: No cervical lymphadenopathy. Cardiovascular: Normal rate, regular rhythm. Normal S1 and S2.  Good peripheral circulation. Respiratory: Normal respiratory effort without tachypnea or retractions. Lungs CTAB. Good air entry to the bases with no decreased or absent breath sounds. Gastrointestinal: Bowel sounds 4 quadrants. Soft and nontender to palpation. No guarding or rigidity. No palpable masses. No distention. No CVA tenderness. Musculoskeletal: Full range of motion to all extremities. No gross deformities appreciated. Neurologic:  Normal speech and language. No gross focal neurologic deficits are appreciated. Cranial nerves II through XII grossly intact. Skin:   Skin is warm, dry and intact. No rash noted. Psychiatric: Mood and affect appear anxious. Speech and behavior are normal. Patient exhibits appropriate insight and judgement.   ____________________________________________   LABS (all labs ordered are listed, but only abnormal results are displayed)  Labs Reviewed  GLUCOSE, CAPILLARY - Abnormal; Notable for the following:       Result Value   Glucose-Capillary 206 (*)    All other components within normal limits  COMPREHENSIVE METABOLIC PANEL - Abnormal; Notable for the following:    Sodium 134 (*)    Glucose, Bld 173 (*)    BUN 24 (*)    Creatinine, Ser 1.01 (*)    ALT 12 (*)    Total Bilirubin <0.1 (*)    GFR calc non Af Amer 52 (*)    GFR calc Af Amer 60 (*)    All other components within normal limits  CBC WITH DIFFERENTIAL/PLATELET - Abnormal; Notable for the following:    RBC 3.69 (*)    Hemoglobin 11.8 (*)    HCT 34.0 (*)    All other components within normal limits  URINALYSIS, COMPLETE (UACMP) WITH MICROSCOPIC - Abnormal; Notable for the following:    Color, Urine YELLOW (*)    APPearance CLEAR (*)    Hgb urine dipstick MODERATE (*)    Bacteria, UA RARE (*)    All other components within normal limits  URINE CULTURE   ____________________________________________  EKG  EKG reveals normal sinus rhythm at rate of 64 bpm. No ST elevation or depression noted. Left axis deviation. LVH. Abnormal EKG.  ____________________________________________  RADIOLOGY   No results found.  ____________________________________________    PROCEDURES  Procedure(s) performed:    Procedures    Medications  sodium chloride 0.9 % bolus 1,000 mL (0 mLs Intravenous Stopped 09/12/16 2049)     ____________________________________________   INITIAL IMPRESSION / ASSESSMENT AND PLAN / ED COURSE  Pertinent labs & imaging results that were available during my care of the patient were reviewed by me and considered in my  medical decision making (see chart for details).  Review of the  CSRS was performed in accordance of the [place] prior to dispensing any controlled drugs.  Clinical Course     Patient's diagnosis is consistent with Hyperglycemia. Patient has had a history of diabetes mellitus in the past that was diet controlled. Patient has had elevated blood sugars on daily readings for the past few weeks. Patient became concerned and was seen by urgent care. Patient had slightly elevated blood sugars with decreasing renal function. At that time, patient was instructed that level of hypoglycemia versus risk of medication interaction with kidneys and hypoglycemia that medication menstruation at this time warranted. Patient reports her blood sugars did improve over the past 2 days but returned to  greater than 200 today. Patient has no other complaints of headache, visual changes, abdominal pain, nausea or vomiting. Patient has felt weak over the past several weeks. Patient also has generalized malaise. No other concerning symptoms. Workup today is reassuring with no acute findings. Patient does have slightly elevated blood sugar that does improve after fluid. At this time, with patient's borderline kidney function, no evidence of end organ damage, patient is to follow-up with primary care for medication management regarding hyperglycemia..  Patient is given ED precautions to return to the ED for any worsening or new symptoms.     ____________________________________________  FINAL CLINICAL IMPRESSION(S) / ED DIAGNOSES  Final diagnoses:  Hyperglycemia      NEW MEDICATIONS STARTED DURING THIS VISIT:  Discharge Medication List as of 09/12/2016  9:13 PM          This chart was dictated using voice recognition software/Dragon. Despite best efforts to proofread, errors can occur which can change the meaning. Any change was purely unintentional.    Darletta Moll, PA-C 09/12/16 2348    Nance Pear, MD 09/14/16 [phone]

## 2016-09-12 NOTE — ED Notes (Signed)
Pt here for weakness and hyperglycemia. States the problem has been ongoing and spoke with her doctor about it but has not made the appointment with them. Was seen at urgent care two days ago for the same and had blood work drawn which shows her ckd progressing. Pt is diabetic and has been on diet control for two years. States she checks her sugar daily and would like [place] to give her a shot to bring her sugar down. Also wants to know why she is weak all the time.

## 2016-09-12 NOTE — ED Triage Notes (Signed)
Pt seen at [facility] on 12/21 for elevated blood sugars. Pt states her blood sugars have been in the 260s for the past week. Unable to get into PCP's office for appointment. Pt has been off diabetes medications for the past 2 years.  Pt states she is weak, but is ongoing weakness and is not new.  Pt is alert and oriented. Pt also states she has kidney disease as well.

## 2016-09-14 LAB — URINE CULTURE: Special Requests: NORMAL

## 2016-10-20 ENCOUNTER — Other Ambulatory Visit: Payer: Self-pay | Admitting: Internal Medicine

## 2016-10-20 DIAGNOSIS — Z1231 Encounter for screening mammogram for malignant neoplasm of breast: Secondary | ICD-10-CM

## 2016-10-23 ENCOUNTER — Ambulatory Visit
Admission: EM | Admit: 2016-10-23 | Discharge: 2016-10-23 | Disposition: A | Discharge status: Home / Self Care | Payer: Medicare Other | Attending: Internal Medicine | Admitting: Internal Medicine

## 2016-10-23 DIAGNOSIS — R42 Dizziness and giddiness: Secondary | ICD-10-CM

## 2016-10-23 DIAGNOSIS — R531 Weakness: Secondary | ICD-10-CM | POA: Insufficient documentation

## 2016-10-23 LAB — GLUCOSE, CAPILLARY: GLUCOSE-CAPILLARY: 165 mg/dL — AB (ref 65–99)

## 2016-10-23 NOTE — Discharge Instructions (Addendum)
ECG seems unchanged compared to ECG of 09/12/16.  Blood sugar was 165 at the urgent care.  No clear evidence for stroke. Able to walk with usual level of assistance.  Episode of feeling faint has passed.  Try to eat protein in the mornings on arising, in case there was a dietary contribution to this episode.

## 2016-10-23 NOTE — ED Triage Notes (Signed)
Pt reports she ate breakfast at a restaurant and was traveling home when she started to feel lightheaded and like she might pass out. Reports when this happens it is usually a blood sugar issue. Denies pain. Denies feeling of room spinning.

## 2016-10-23 NOTE — ED Provider Notes (Signed)
[facility]    CSN: [id number] Arrival date & time: 10/23/16  0932     History   Chief Complaint Chief Complaint  Patient presents with  . Dizziness    HPI [name] is a 83 y.o. female.   Lives with her daughter, went to [place] this morning and in the car on the way home expressed that she thought she might pass out.  Has not taken her medicines yet. Feeling a little better at this point. No chest or abdominal discomfort, not short of breath. Has neck arthritis and this is bothering her a lot. Tired of feeling weak, but no acute change in feeling of weakness.  No leg swelling. No change in bowels or bladder.  HPI  Past Medical History:  Diagnosis Date  . Anemia   . Arthritis    right knee  . Blood clots in brain   . Depression   . Diabetes mellitus without complication ([place])    Diet controlled  . Headache    sinus  . History of hiatal hernia   . Hypercholesteremia   . Hypertension   . Renal cancer ([place])    right kidney  . Seasonal allergies   . TIA (transient ischemic attack) 2000   no deficits  . Wears dentures    full upper and lower    Patient Active Problem List   Diagnosis Date Noted  . Major depressive disorder, recurrent episode, in full remission ([place]) 01/02/2016  . Osteoporosis, post-menopausal 12/10/2015  . Breast lump 11/19/2015  . Other long term (current) drug therapy 05/03/2015  . Mild cognitive disorder 10/31/2014  . Hemorrhage of vagina 06/15/2014  . Severe episode of recurrent major depressive disorder ([place]) 05/08/2014  . History of urinary anomaly 04/30/2014  . Personal history of urinary infection 04/30/2014  . Anemia in chronic illness 04/29/2014  . Absolute anemia 04/29/2014  . Benign essential HTN 04/29/2014  . Chronic kidney disease (CKD), stage III (moderate) 02/06/2014  . B12 deficiency 02/06/2014  . Anxiety and depression 02/06/2014  . Hypercholesterolemia 02/06/2014  . Fall 02/21/2013  . Malaise and  fatigue 02/21/2013  . Adynamia 02/21/2013  . Bladder pain 02/20/2013  . Difficult or painful urination 02/20/2013  . Frank hematuria 02/20/2013  . Labial cyst 02/20/2013  . Malignant neoplasm of kidney ([place]) 02/20/2013  . Urge incontinence of urine 02/20/2013  . Renal cell carcinoma ([place]) 02/20/2013  . Diabetes mellitus ([place]) 06/06/2010  . Benign hypertension 06/06/2010  . Polyneuropathy in diseases classified elsewhere ([place]) 06/06/2010  . Neuropathy ([place]) 06/06/2010  . Mononeuritis 06/06/2010    Past Surgical History:  Procedure Laterality Date  . ABDOMINAL HYSTERECTOMY    . APPENDECTOMY    . BRAIN SURGERY     for blood clot  . BREAST BIOPSY Left 03/21/13   u/s bx-neg  . BREAST BIOPSY Left    neg  . BREAST CYST ASPIRATION Left    neg  . CATARACT EXTRACTION W/PHACO Right 02/13/2015   Procedure: CATARACT EXTRACTION PHACO AND INTRAOCULAR LENS PLACEMENT (IOC);  Surgeon: Leandrew Koyanagi, MD;  Location: [place];  Service: Ophthalmology;  Laterality: Right;  DIABETIC  . CATARACT EXTRACTION W/PHACO Left 03/20/2015   Procedure: CATARACT EXTRACTION PHACO AND INTRAOCULAR LENS PLACEMENT (IOC);  Surgeon: Leandrew Koyanagi, MD;  Location: [place];  Service: Ophthalmology;  Laterality: Left;  . CHOLECYSTECTOMY         Home Medications    Prior to Admission medications   Medication Sig Start Date End  Date Taking? Authorizing Provider  ACCU-CHEK SOFTCLIX LANCETS lancets  03/15/15   Historical Provider, MD  aspirin 81 MG tablet Take 81 mg by mouth daily. AM    Historical Provider, MD  atorvastatin (LIPITOR) 10 MG tablet Take 10 mg by mouth daily. AM    Historical Provider, MD  buPROPion (WELLBUTRIN XL) 150 MG 24 hr tablet Take 1 tablet (150 mg total) by mouth daily. AM 09/09/16   Rainey Pines, MD  Calcium Carb-Cholecalciferol (CALCIUM-VITAMIN D) 500-400 MG-UNIT TABS Take by mouth. 08/22/09   Historical Provider, MD  escitalopram (LEXAPRO) 20 MG tablet Take 1  tablet (20 mg total) by mouth daily. AM 09/09/16   Rainey Pines, MD  memantine (NAMENDA) 10 MG tablet Take 1 tablet (10 mg total) by mouth daily. PM 09/09/16   Rainey Pines, MD  Multiple Vitamin (THERA) TABS Take by mouth. 05/11/14   Historical Provider, MD  vitamin B-12 (CYANOCOBALAMIN) 500 MCG tablet Take 500 mcg by mouth daily. AM    Historical Provider, MD    Family History Family History  Problem Relation Age of Onset  . Diabetes Daughter     Social History Social History  Substance Use Topics  . Smoking status: Never Smoker  . Smokeless tobacco: Never Used  . Alcohol use No     Allergies   Erythromycin; Oxycodone-acetaminophen; Oxycodone-acetaminophen; Tapentadol; Amoxicillin; Ezetimibe; Gabapentin; Glucosamine-chondroitin; Lovastatin; Omeprazole; Azithromycin; Erythromycin base; Heparin; and Other   Review of Systems Review of Systems  All other systems reviewed and are negative.    Physical Exam Triage Vital Signs ED Triage Vitals  Enc Vitals Group     BP 10/23/16 0952 (!) 174/76     Pulse Rate 10/23/16 0952 71     Resp 10/23/16 0952 16     Temp --      Temp src --      SpO2 10/23/16 0952 98 %     Weight 10/23/16 0952 128 lb (58.1 kg)     Height 10/23/16 0952 5\' 8"  (1.727 m)     Pain Score 10/23/16 0955 0     Pain Loc --    Updated Vital Signs BP (!) 154/67 (BP Location: Right Arm)   Pulse 71   Resp 16   Ht 5\' 8"  (1.727 m)   Wt 128 lb (58.1 kg)   SpO2 98%   BMI 19.46 kg/m   Physical Exam  Constitutional: She is oriented to person, place, and time. No distress.  Alert, nicely groomed  HENT:  Head: Atraumatic.  Eyes:  Conjugate gaze, no eye redness/drainage  Neck: Neck supple.  Cardiovascular: Normal rate.   Pulmonary/Chest: No respiratory distress.  Lungs clear, symmetric breath sounds  Abdominal: She exhibits no distension.  Musculoskeletal: Normal range of motion.  No leg swelling  Neurological: She is alert and oriented to person, place,  and time.  Skin: Skin is warm and dry.  No cyanosis  Nursing note and vitals reviewed.    UC Treatments / Results  Labs (all labs ordered are listed, but only abnormal results are displayed) Labs Reviewed  GLUCOSE, CAPILLARY - Abnormal; Notable for the following:       Result Value   Glucose-Capillary 165 (*)    All other components within normal limits  CBG MONITORING, ED    EKG NSR, no acute ST or T wave changes.  No change apparent to me from ECG of 09/12/16.  Procedures Procedures (including critical care time) None today  Final Clinical Impressions(s) / UC Diagnoses  Final diagnoses:  Dizziness  brief episode, possibly related to taking meds/eating late after arising today.  ECG seems unchanged compared to ECG of 09/12/16.  Blood sugar was 165 at the urgent care.  No clear evidence for stroke. Able to walk with usual level of assistance.  Episode of feeling faint has passed.  Try to eat protein in the mornings on arising, in case there was a dietary contribution to this episode.       Sherlene Shams, MD 10/24/16 2138

## 2016-11-18 ENCOUNTER — Ambulatory Visit
Admission: RE | Admit: 2016-11-18 | Discharge: 2016-11-18 | Disposition: A | Discharge status: Home / Self Care | Payer: Medicare Other | Source: Ambulatory Visit | Attending: Internal Medicine | Admitting: Internal Medicine

## 2016-11-18 ENCOUNTER — Encounter (INDEPENDENT_AMBULATORY_CARE_PROVIDER_SITE_OTHER): Payer: Self-pay

## 2016-11-18 DIAGNOSIS — R928 Other abnormal and inconclusive findings on diagnostic imaging of breast: Secondary | ICD-10-CM | POA: Diagnosis not present

## 2016-11-18 DIAGNOSIS — Z1231 Encounter for screening mammogram for malignant neoplasm of breast: Secondary | ICD-10-CM | POA: Diagnosis present

## 2016-11-19 ENCOUNTER — Other Ambulatory Visit: Payer: Self-pay | Admitting: Internal Medicine

## 2016-11-19 DIAGNOSIS — R928 Other abnormal and inconclusive findings on diagnostic imaging of breast: Secondary | ICD-10-CM

## 2016-11-19 DIAGNOSIS — N6320 Unspecified lump in the left breast, unspecified quadrant: Secondary | ICD-10-CM

## 2016-11-22 DIAGNOSIS — Z85528 Personal history of other malignant neoplasm of kidney: Secondary | ICD-10-CM | POA: Insufficient documentation

## 2016-12-01 ENCOUNTER — Ambulatory Visit: Payer: Medicare Other

## 2016-12-01 ENCOUNTER — Other Ambulatory Visit: Payer: Medicare Other

## 2016-12-09 ENCOUNTER — Ambulatory Visit: Payer: Medicare Other | Admitting: Psychiatry

## 2016-12-10 ENCOUNTER — Other Ambulatory Visit: Payer: Medicare Other

## 2016-12-15 ENCOUNTER — Ambulatory Visit
Admission: RE | Admit: 2016-12-15 | Discharge: 2016-12-15 | Disposition: A | Discharge status: Home / Self Care | Payer: Medicare Other | Source: Ambulatory Visit | Attending: Internal Medicine | Admitting: Internal Medicine

## 2016-12-15 DIAGNOSIS — R928 Other abnormal and inconclusive findings on diagnostic imaging of breast: Secondary | ICD-10-CM

## 2016-12-15 DIAGNOSIS — N6002 Solitary cyst of left breast: Secondary | ICD-10-CM | POA: Insufficient documentation

## 2016-12-15 DIAGNOSIS — N6320 Unspecified lump in the left breast, unspecified quadrant: Secondary | ICD-10-CM

## 2017-01-08 ENCOUNTER — Emergency Department
Admission: EM | Admit: 2017-01-08 | Discharge: 2017-01-08 | Disposition: A | Discharge status: Home / Self Care | Payer: Medicare Other | Attending: Student in an Organized Health Care Education/Training Program | Admitting: Student in an Organized Health Care Education/Training Program

## 2017-01-08 ENCOUNTER — Ambulatory Visit
Admission: EM | Admit: 2017-01-08 | Discharge: 2017-01-08 | Disposition: A | Discharge status: Home / Self Care | Payer: Medicare Other | Attending: Family Medicine | Admitting: Family Medicine

## 2017-01-08 ENCOUNTER — Ambulatory Visit (INDEPENDENT_AMBULATORY_CARE_PROVIDER_SITE_OTHER): Payer: Medicare Other

## 2017-01-08 ENCOUNTER — Encounter: Payer: Self-pay | Admitting: Emergency Medicine

## 2017-01-08 DIAGNOSIS — E876 Hypokalemia: Secondary | ICD-10-CM | POA: Diagnosis not present

## 2017-01-08 DIAGNOSIS — Z79899 Other long term (current) drug therapy: Secondary | ICD-10-CM | POA: Diagnosis not present

## 2017-01-08 DIAGNOSIS — E1122 Type 2 diabetes mellitus with diabetic chronic kidney disease: Secondary | ICD-10-CM | POA: Diagnosis not present

## 2017-01-08 DIAGNOSIS — R112 Nausea with vomiting, unspecified: Secondary | ICD-10-CM

## 2017-01-08 DIAGNOSIS — K567 Ileus, unspecified: Secondary | ICD-10-CM

## 2017-01-08 DIAGNOSIS — R197 Diarrhea, unspecified: Secondary | ICD-10-CM

## 2017-01-08 DIAGNOSIS — E860 Dehydration: Secondary | ICD-10-CM | POA: Diagnosis not present

## 2017-01-08 DIAGNOSIS — Z85528 Personal history of other malignant neoplasm of kidney: Secondary | ICD-10-CM | POA: Insufficient documentation

## 2017-01-08 DIAGNOSIS — N183 Chronic kidney disease, stage 3 (moderate): Secondary | ICD-10-CM | POA: Diagnosis not present

## 2017-01-08 DIAGNOSIS — Z7982 Long term (current) use of aspirin: Secondary | ICD-10-CM | POA: Diagnosis not present

## 2017-01-08 DIAGNOSIS — I129 Hypertensive chronic kidney disease with stage 1 through stage 4 chronic kidney disease, or unspecified chronic kidney disease: Secondary | ICD-10-CM | POA: Diagnosis not present

## 2017-01-08 LAB — URINALYSIS, COMPLETE (UACMP) WITH MICROSCOPIC
BILIRUBIN URINE: NEGATIVE
GLUCOSE, UA: NEGATIVE mg/dL
KETONES UR: NEGATIVE mg/dL
NITRITE: NEGATIVE
PROTEIN: NEGATIVE mg/dL
Specific Gravity, Urine: 1.015 (ref 1.005–1.030)
pH: 6 (ref 5.0–8.0)

## 2017-01-08 LAB — CBC WITH DIFFERENTIAL/PLATELET
BASOS ABS: 0 10*3/uL (ref 0–0.1)
BASOS PCT: 1 %
Eosinophils Absolute: 0.1 10*3/uL (ref 0–0.7)
Eosinophils Relative: 1 %
HEMATOCRIT: 34.6 % — AB (ref 35.0–47.0)
Hemoglobin: 11.6 g/dL — ABNORMAL LOW (ref 12.0–16.0)
Lymphocytes Relative: 24 %
Lymphs Abs: 1.2 10*3/uL (ref 1.0–3.6)
MCH: 31.2 pg (ref 26.0–34.0)
MCHC: 33.4 g/dL (ref 32.0–36.0)
MCV: 93.2 fL (ref 80.0–100.0)
MONO ABS: 0.6 10*3/uL (ref 0.2–0.9)
Monocytes Relative: 12 %
NEUTROS ABS: 3.2 10*3/uL (ref 1.4–6.5)
NEUTROS PCT: 62 %
Platelets: 293 10*3/uL (ref 150–440)
RBC: 3.72 MIL/uL — AB (ref 3.80–5.20)
RDW: 12.6 % (ref 11.5–14.5)
WBC: 5.2 10*3/uL (ref 3.6–11.0)

## 2017-01-08 LAB — MAGNESIUM: MAGNESIUM: 1.7 mg/dL (ref 1.7–2.4)

## 2017-01-08 LAB — COMPREHENSIVE METABOLIC PANEL
ALBUMIN: 3.6 g/dL (ref 3.5–5.0)
ALK PHOS: 60 U/L (ref 38–126)
ALT: 30 U/L (ref 14–54)
AST: 55 U/L — AB (ref 15–41)
Anion gap: 8 (ref 5–15)
BILIRUBIN TOTAL: 0.9 mg/dL (ref 0.3–1.2)
BUN: 15 mg/dL (ref 6–20)
CALCIUM: 9 mg/dL (ref 8.9–10.3)
CO2: 29 mmol/L (ref 22–32)
CREATININE: 0.9 mg/dL (ref 0.44–1.00)
Chloride: 98 mmol/L — ABNORMAL LOW (ref 101–111)
GFR calc Af Amer: >60 mL/min (ref >60)
GFR, EST NON AFRICAN AMERICAN: 59 mL/min — AB (ref >60)
GLUCOSE: 114 mg/dL — AB (ref 65–99)
Potassium: 3.1 mmol/L — ABNORMAL LOW (ref 3.5–5.1)
Sodium: 135 mmol/L (ref 135–145)
TOTAL PROTEIN: 6.5 g/dL (ref 6.5–8.1)

## 2017-01-08 LAB — OCCULT BLOOD X 1 CARD TO LAB, STOOL: Fecal Occult Bld: NEGATIVE

## 2017-01-08 MED ORDER — METOCLOPRAMIDE HCL 10 MG PO TABS: 10.0000 mg | ORAL_TABLET | Freq: Four times a day (QID) | ORAL | 0 refills | Status: DC | PRN

## 2017-01-08 MED ORDER — POTASSIUM CHLORIDE CRYS ER 20 MEQ PO TBCR
20.0000 meq | EXTENDED_RELEASE_TABLET | Freq: Once | ORAL | Status: AC
Start: 2017-01-08 — End: 2017-01-08
  Administered 2017-01-08: 20 meq via ORAL

## 2017-01-08 MED ORDER — SODIUM CHLORIDE 0.9 % IV BOLUS (SEPSIS)
1000.0000 mL | Freq: Once | INTRAVENOUS | Status: AC
  Administered 2017-01-08: 1000 mL via INTRAVENOUS

## 2017-01-08 MED ORDER — POTASSIUM CHLORIDE CRYS ER 20 MEQ PO TBCR
EXTENDED_RELEASE_TABLET | ORAL | Status: AC
  Filled 2017-01-08: qty 2

## 2017-01-08 MED ORDER — METOCLOPRAMIDE HCL 5 MG/ML IJ SOLN
10.0000 mg | Freq: Once | INTRAMUSCULAR | Status: AC
  Administered 2017-01-08: 10 mg via INTRAVENOUS
  Filled 2017-01-08: qty 2

## 2017-01-08 MED ORDER — ONDANSETRON 4 MG PO TBDP
4.0000 mg | ORAL_TABLET | Freq: Once | ORAL | Status: AC
  Administered 2017-01-08: 4 mg via ORAL

## 2017-01-08 NOTE — ED Triage Notes (Addendum)
Patient presents to the ED with nausea, vomiting, and diarrhea x 6 days.  Patient was seen at [facility] and sent to the ED due to x-ray results showing possible "diffuse ileus".  Patient had lab work drawn at [facility] as well showing low potassium.  Patient states she has not had vomiting and diarrhea today.  Patient is alert and oriented x 4.

## 2017-01-08 NOTE — ED Provider Notes (Signed)
[facility] Emergency Department Provider Note    First MD Initiated Contact with Patient 01/08/17 1626     (approximate)  I have reviewed the triage vital signs and the nursing notes.   HISTORY  Chief Complaint Emesis and Diarrhea    HPI Lori Gilmore is a 83 y.o. female Presents with chief complaint of 7 days of dehydration diarrhea and and now weakness. Patient with multiple family members that were sick with similar illness last week. Has had decreased oral intake Denies any abdominal pain. She went to urgent care today where they did a KUB which was read as possible ileus so she was sent emergently to the ER. Patient is unsure as to why she is in the ER.     Past Medical History:  Diagnosis Date  . Anemia   . Arthritis    right knee  . Blood clots in brain   . Depression   . Diabetes mellitus without complication ([place])    Diet controlled  . Headache    sinus  . History of hiatal hernia   . Hypercholesteremia   . Hypertension   . Renal cancer ([place])    right kidney  . Seasonal allergies   . TIA (transient ischemic attack) 2000   no deficits  . Wears dentures    full upper and lower   Family History  Problem Relation Age of Onset  . Diabetes Daughter   . Breast cancer Neg Hx    Past Surgical History:  Procedure Laterality Date  . ABDOMINAL HYSTERECTOMY    . APPENDECTOMY    . BRAIN SURGERY     for blood clot  . BREAST BIOPSY Left 03/21/13   u/s bx-neg  . BREAST BIOPSY Left    neg  . BREAST CYST ASPIRATION Left    neg  . CATARACT EXTRACTION W/PHACO Right 02/13/2015   Procedure: CATARACT EXTRACTION PHACO AND INTRAOCULAR LENS PLACEMENT (IOC);  Surgeon: Leandrew Koyanagi, MD;  Location: [employer];  Service: Ophthalmology;  Laterality: Right;  DIABETIC  . CATARACT EXTRACTION W/PHACO Left 03/20/2015   Procedure: CATARACT EXTRACTION PHACO AND INTRAOCULAR LENS PLACEMENT (IOC);  Surgeon: Leandrew Koyanagi, MD;  Location:  [place];  Service: Ophthalmology;  Laterality: Left;  . CHOLECYSTECTOMY     Patient Active Problem List   Diagnosis Date Noted  . Major depressive disorder, recurrent episode, in full remission ([place]) 01/02/2016  . Osteoporosis, post-menopausal 12/10/2015  . Breast lump 11/19/2015  . Other long term (current) drug therapy 05/03/2015  . Mild cognitive disorder 10/31/2014  . Hemorrhage of vagina 06/15/2014  . Severe episode of recurrent major depressive disorder ([place]) 05/08/2014  . History of urinary anomaly 04/30/2014  . Personal history of urinary infection 04/30/2014  . Anemia in chronic illness 04/29/2014  . Absolute anemia 04/29/2014  . Benign essential HTN 04/29/2014  . Chronic kidney disease (CKD), stage III (moderate) 02/06/2014  . B12 deficiency 02/06/2014  . Anxiety and depression 02/06/2014  . Hypercholesterolemia 02/06/2014  . Fall 02/21/2013  . Malaise and fatigue 02/21/2013  . Adynamia 02/21/2013  . Bladder pain 02/20/2013  . Difficult or painful urination 02/20/2013  . Frank hematuria 02/20/2013  . Labial cyst 02/20/2013  . Malignant neoplasm of kidney ([place]) 02/20/2013  . Urge incontinence of urine 02/20/2013  . Renal cell carcinoma ([place]) 02/20/2013  . Diabetes mellitus ([place]) 06/06/2010  . Benign hypertension 06/06/2010  . Polyneuropathy in diseases classified elsewhere ([place]) 06/06/2010  . Neuropathy 06/06/2010  . Mononeuritis  06/06/2010      Prior to Admission medications   Medication Sig Start Date End Date Taking? Authorizing Provider  aspirin 81 MG tablet Take 81 mg by mouth daily. AM   Yes Historical Provider, MD  atorvastatin (LIPITOR) 10 MG tablet Take 10 mg by mouth daily. AM   Yes Historical Provider, MD  buPROPion (WELLBUTRIN XL) 150 MG 24 hr tablet Take 1 tablet (150 mg total) by mouth daily. AM 09/09/16  Yes Rainey Pines, MD  Calcium Carb-Cholecalciferol (CALCIUM-VITAMIN D) 500-400 MG-UNIT TABS Take 1 tablet by mouth daily.  08/22/09  Yes  Historical Provider, MD  escitalopram (LEXAPRO) 20 MG tablet Take 1 tablet (20 mg total) by mouth daily. AM 09/09/16  Yes Rainey Pines, MD  lisinopril-hydrochlorothiazide (PRINZIDE,ZESTORETIC) 20-12.5 MG tablet Take 1 tablet by mouth daily.   Yes Historical Provider, MD  memantine (NAMENDA) 10 MG tablet Take 1 tablet (10 mg total) by mouth daily. PM 09/09/16  Yes Rainey Pines, MD  Multiple Vitamin (THERA) TABS Take 1 tablet by mouth daily.  05/11/14  Yes Historical Provider, MD  saxagliptin HCl (ONGLYZA) 2.5 MG TABS tablet Take 2.5 mg by mouth daily.   Yes Historical Provider, MD  vitamin B-12 (CYANOCOBALAMIN) 500 MCG tablet Take 500 mcg by mouth daily. AM   Yes Historical Provider, MD  ACCU-CHEK SOFTCLIX LANCETS lancets  03/15/15   Historical Provider, MD  metoCLOPramide (REGLAN) 10 MG tablet Take 1 tablet (10 mg total) by mouth every 6 (six) hours as needed for nausea. 01/08/17 01/08/18  Merlyn Lot, MD    Allergies Erythromycin; Oxycodone-acetaminophen; Oxycodone-acetaminophen; Tapentadol; Amoxicillin; Ezetimibe; Gabapentin; Glucosamine-chondroitin; Lovastatin; Omeprazole; Azithromycin; Erythromycin base; Heparin; and Other    Social History Social History  Substance Use Topics  . Smoking status: Never Smoker  . Smokeless tobacco: Never Used  . Alcohol use No    Review of Systems Patient denies headaches, rhinorrhea, blurry vision, numbness, shortness of breath, chest pain, edema, cough, abdominal pain, nausea, vomiting, diarrhea, dysuria, fevers, rashes or hallucinations unless otherwise stated above in HPI. ____________________________________________   PHYSICAL EXAM:  VITAL SIGNS: Vitals:   01/08/17 1609 01/08/17 1813  BP: (!) 144/61 (!) 145/72  Pulse: 74 72  Resp: 18 17  Temp: 98.5 F (36.9 C)     Constitutional: Alert and oriented. Well appearing and in no acute distress. Eyes: Conjunctivae are normal. PERRL. EOMI. Head: Atraumatic. Nose: No  congestion/rhinnorhea. Mouth/Throat: Mucous membranes are moist.  Oropharynx non-erythematous. Neck: No stridor. Painless ROM. No cervical spine tenderness to palpation Hematological/Lymphatic/Immunilogical: No cervical lymphadenopathy. Cardiovascular: Normal rate, regular rhythm. Grossly normal heart sounds.  Good peripheral circulation. Respiratory: Normal respiratory effort.  No retractions. Lungs CTAB. Gastrointestinal: Soft and nontender. No distention. No abdominal bruits. No CVA tenderness. Genitourinary:  Musculoskeletal: No lower extremity tenderness nor edema.  No joint effusions. Neurologic:  Normal speech and language. No gross focal neurologic deficits are appreciated. No gait instability. Skin:  Skin is warm, dry and intact. No rash noted. Psychiatric: Mood and affect are normal. Speech and behavior are normal.  ____________________________________________   LABS (all labs ordered are listed, but only abnormal results are displayed)  Results for orders placed or performed during the hospital encounter of 01/08/17 (from the past 24 hour(s))  CBC with Differential     Status: Abnormal   Collection Time: 01/08/17  1:21 PM  Result Value Ref Range   WBC 5.2 3.6 - 11.0 K/uL   RBC 3.72 (L) 3.80 - 5.20 MIL/uL   Hemoglobin 11.6 (L) 12.0 -  16.0 g/dL   HCT 34.6 (L) 35.0 - 47.0 %   MCV 93.2 80.0 - 100.0 fL   MCH 31.2 26.0 - 34.0 pg   MCHC 33.4 32.0 - 36.0 g/dL   RDW 12.6 11.5 - 14.5 %   Platelets 293 150 - 440 K/uL   Neutrophils Relative % 62 %   Neutro Abs 3.2 1.4 - 6.5 K/uL   Lymphocytes Relative 24 %   Lymphs Abs 1.2 1.0 - 3.6 K/uL   Monocytes Relative 12 %   Monocytes Absolute 0.6 0.2 - 0.9 K/uL   Eosinophils Relative 1 %   Eosinophils Absolute 0.1 0 - 0.7 K/uL   Basophils Relative 1 %   Basophils Absolute 0.0 0 - 0.1 K/uL  Comprehensive metabolic panel     Status: Abnormal   Collection Time: 01/08/17  1:21 PM  Result Value Ref Range   Sodium 135 135 - 145 mmol/L    Potassium 3.1 (L) 3.5 - 5.1 mmol/L   Chloride 98 (L) 101 - 111 mmol/L   CO2 29 22 - 32 mmol/L   Glucose, Bld 114 (H) 65 - 99 mg/dL   BUN 15 6 - 20 mg/dL   Creatinine, Ser 0.90 0.44 - 1.00 mg/dL   Calcium 9.0 8.9 - 10.3 mg/dL   Total Protein 6.5 6.5 - 8.1 g/dL   Albumin 3.6 3.5 - 5.0 g/dL   AST 55 (H) 15 - 41 U/L   ALT 30 14 - 54 U/L   Alkaline Phosphatase 60 38 - 126 U/L   Total Bilirubin 0.9 0.3 - 1.2 mg/dL   GFR calc non Af Amer 59 (L) >60 mL/min   GFR calc Af Amer >60 >60 mL/min   Anion gap 8 5 - 15  Magnesium     Status: None   Collection Time: 01/08/17  1:21 PM  Result Value Ref Range   Magnesium 1.7 1.7 - 2.4 mg/dL  Occult blood card to lab, stool     Status: None   Collection Time: 01/08/17  1:21 PM  Result Value Ref Range   Fecal Occult Bld NEGATIVE NEGATIVE  Urinalysis, Complete w Microscopic     Status: Abnormal   Collection Time: 01/08/17  1:59 PM  Result Value Ref Range   Color, Urine YELLOW YELLOW   APPearance CLEAR CLEAR   Specific Gravity, Urine 1.015 1.005 - 1.030   pH 6.0 5.0 - 8.0   Glucose, UA NEGATIVE NEGATIVE mg/dL   Hgb urine dipstick SMALL (A) NEGATIVE   Bilirubin Urine NEGATIVE NEGATIVE   Ketones, ur NEGATIVE NEGATIVE mg/dL   Protein, ur NEGATIVE NEGATIVE mg/dL   Nitrite NEGATIVE NEGATIVE   Leukocytes, UA TRACE (A) NEGATIVE   Squamous Epithelial / LPF 0-5 (A) NONE SEEN   WBC, UA 0-5 0 - 5 WBC/hpf   RBC / HPF 6-30 0 - 5 RBC/hpf   Bacteria, UA FEW (A) NONE SEEN   Mucous PRESENT    Hyaline Casts, UA PRESENT    ____________________________________________  ____________________________________________  RADIOLOGY  I personally reviewed all radiographic images ordered to evaluate for the above acute complaints and reviewed radiology reports and findings.  These findings were personally discussed with the patient.  Please see medical record for radiology report.  ____________________________________________   PROCEDURES  Procedure(s)  performed:  Procedures    Critical Care performed: no ____________________________________________   INITIAL IMPRESSION / ASSESSMENT AND PLAN / ED COURSE  Pertinent labs & imaging results that were available during my care of the patient were  reviewed by me and considered in my medical decision making (see chart for details).  DDX: dehydration, electrolyte abn, ileus, obstruction  [place] D Gayle is a 83 y.o. who presents to the ED with symptoms consistent with enteritis.  Patient is AFVSS in ED. Exam as above. Given current presentation have considered the above differential.  Her abdominal exam is soft and benign. He is requesting something to drink. X-ray shows possible ileus which would not be unusual given her symptoms. Not consistent with obstructive process.  We'll provide IV fluids for recent illness.    Patient was able to tolerate PO and was able to ambulate with a steady gait.  This is not clinically consistent with sbo or significant ileus.  Patient stable for DC home.  Have discussed with the patient and available family all diagnostics and treatments performed thus far and all questions were answered to the best of my ability. The patient demonstrates understanding and agreement with plan.   ____________________________________________   FINAL CLINICAL IMPRESSION(S) / ED DIAGNOSES  Final diagnoses:  Dehydration  Nausea vomiting and diarrhea      NEW MEDICATIONS STARTED DURING THIS VISIT:  Discharge Medication List as of 01/08/2017  6:06 PM    START taking these medications   Details  metoCLOPramide (REGLAN) 10 MG tablet Take 1 tablet (10 mg total) by mouth every 6 (six) hours as needed for nausea., Starting Fri 01/08/2017, Until Sat 01/08/2018, Print         Note:  This document was prepared using Dragon voice recognition software and may include unintentional dictation errors.    Merlyn Lot, MD 01/08/17 2201

## 2017-01-08 NOTE — ED Triage Notes (Signed)
Pt reports she was sick last week with n/v/d. Lasted approx 6 days. Felt better for 2 days but now feels like it may be starting again. Has nausea today. One small episode diarrhea x one today. No vomiting.

## 2017-01-08 NOTE — Discharge Instructions (Signed)
Go directly to emergency room.  

## 2017-01-08 NOTE — ED Notes (Signed)
Blood sugar rechecked.  Dr. Quentin Cornwall alerted.  Patient given sandwich tray, 8 oz orange juice and crackers and peanut butter.  Patient taking PO well.  Continue to monitor.

## 2017-01-08 NOTE — ED Provider Notes (Signed)
[facility] ____________________________________________  Time seen: Approximately 2:14 PM  I have reviewed the triage vital signs and the nursing notes.   HISTORY  Chief Complaint Nausea  HPI Lori Gilmore is a 83 y.o. female present for evaluation of nausea, vomiting and diarrhea. Patient reports symptoms initially started last week. Patient states that she had approximately 6 days of the intermittent diarrhea and vomiting with around 5 episodes per day in each, then states her symptoms much improved from the last 2 days. Patient expressed concern that symptoms are starting to return as she had diarrhea this morning. Patient reports last vomiting yesterday. States symptoms never fully resolved. Denies any abnormal colored stool or vomit, except diarrhea today appeared slightly darker than normal but not black and not bloody. Patient has not been taking any over-the-counter medications for the same complaints. Reports has continued to drink fluids. States she has been having mild accompanying abdominal pain that has lasted. States also with generalized weakness.   Denies chest pain, shortness of breath, dysuria, syncope or near syncope, extremity pain, extremity swelling or rash. Denies recent sickness. Denies recent antibiotic use.    Ezequiel Kayser, MD: PCP   Past Medical History:  Diagnosis Date  . Anemia   . Arthritis    right knee  . Blood clots in brain   . Depression   . Diabetes mellitus without complication ([place])    Diet controlled  . Headache    sinus  . History of hiatal hernia   . Hypercholesteremia   . Hypertension   . Renal cancer ([place])    right kidney  . Seasonal allergies   . TIA (transient ischemic attack) 2000   no deficits  . Wears dentures    full upper and lower    Patient Active Problem List   Diagnosis Date Noted  . Major depressive disorder, recurrent episode, in full remission ([place]) 01/02/2016  . Osteoporosis, post-menopausal  12/10/2015  . Breast lump 11/19/2015  . Other long term (current) drug therapy 05/03/2015  . Mild cognitive disorder 10/31/2014  . Hemorrhage of vagina 06/15/2014  . Severe episode of recurrent major depressive disorder ([place]) 05/08/2014  . History of urinary anomaly 04/30/2014  . Personal history of urinary infection 04/30/2014  . Anemia in chronic illness 04/29/2014  . Absolute anemia 04/29/2014  . Benign essential HTN 04/29/2014  . Chronic kidney disease (CKD), stage III (moderate) 02/06/2014  . B12 deficiency 02/06/2014  . Anxiety and depression 02/06/2014  . Hypercholesterolemia 02/06/2014  . Fall 02/21/2013  . Malaise and fatigue 02/21/2013  . Adynamia 02/21/2013  . Bladder pain 02/20/2013  . Difficult or painful urination 02/20/2013  . Frank hematuria 02/20/2013  . Labial cyst 02/20/2013  . Malignant neoplasm of kidney ([place]) 02/20/2013  . Urge incontinence of urine 02/20/2013  . Renal cell carcinoma ([place]) 02/20/2013  . Diabetes mellitus ([place]) 06/06/2010  . Benign hypertension 06/06/2010  . Polyneuropathy in diseases classified elsewhere ([place]) 06/06/2010  . Neuropathy 06/06/2010  . Mononeuritis 06/06/2010    Past Surgical History:  Procedure Laterality Date  . ABDOMINAL HYSTERECTOMY    . APPENDECTOMY    . BRAIN SURGERY     for blood clot  . BREAST BIOPSY Left 03/21/13   u/s bx-neg  . BREAST BIOPSY Left    neg  . BREAST CYST ASPIRATION Left    neg  . CATARACT EXTRACTION W/PHACO Right 02/13/2015   Procedure: CATARACT EXTRACTION PHACO AND INTRAOCULAR LENS PLACEMENT (IOC);  Surgeon: Leandrew Koyanagi, MD;  Location:  [place];  Service: Ophthalmology;  Laterality: Right;  DIABETIC  . CATARACT EXTRACTION W/PHACO Left 03/20/2015   Procedure: CATARACT EXTRACTION PHACO AND INTRAOCULAR LENS PLACEMENT (IOC);  Surgeon: Leandrew Koyanagi, MD;  Location: [place];  Service: Ophthalmology;  Laterality: Left;  . CHOLECYSTECTOMY       No current  facility-administered medications for this encounter.   Current Outpatient Prescriptions:  .  lisinopril-hydrochlorothiazide (PRINZIDE,ZESTORETIC) 20-12.5 MG tablet, Take 1 tablet by mouth daily., Disp: , Rfl:  .  saxagliptin HCl (ONGLYZA) 2.5 MG TABS tablet, Take 2.5 mg by mouth daily., Disp: , Rfl:  .  ACCU-CHEK SOFTCLIX LANCETS lancets, , Disp: , Rfl:  .  aspirin 81 MG tablet, Take 81 mg by mouth daily. AM, Disp: , Rfl:  .  atorvastatin (LIPITOR) 10 MG tablet, Take 10 mg by mouth daily. AM, Disp: , Rfl:  .  buPROPion (WELLBUTRIN XL) 150 MG 24 hr tablet, Take 1 tablet (150 mg total) by mouth daily. AM, Disp: 90 tablet, Rfl: 3 .  Calcium Carb-Cholecalciferol (CALCIUM-VITAMIN D) 500-400 MG-UNIT TABS, Take 1 tablet by mouth daily. , Disp: , Rfl:  .  escitalopram (LEXAPRO) 20 MG tablet, Take 1 tablet (20 mg total) by mouth daily. AM, Disp: 90 tablet, Rfl: 2 .  memantine (NAMENDA) 10 MG tablet, Take 1 tablet (10 mg total) by mouth daily. PM, Disp: 90 tablet, Rfl: 3 .  Multiple Vitamin (THERA) TABS, Take 1 tablet by mouth daily. , Disp: , Rfl:  .  vitamin B-12 (CYANOCOBALAMIN) 500 MCG tablet, Take 500 mcg by mouth daily. AM, Disp: , Rfl:   Allergies Erythromycin; Oxycodone-acetaminophen; Oxycodone-acetaminophen; Tapentadol; Amoxicillin; Ezetimibe; Gabapentin; Glucosamine-chondroitin; Lovastatin; Omeprazole; Azithromycin; Erythromycin base; Heparin; and Other  Family History  Problem Relation Age of Onset  . Diabetes Daughter   . Breast cancer Neg Hx     Social History Social History  Substance Use Topics  . Smoking status: Never Smoker  . Smokeless tobacco: Never Used  . Alcohol use No    Review of Systems Constitutional: No fever/chills Eyes: No visual changes. ENT: No sore throat. Cardiovascular: Denies chest pain. Respiratory: Denies shortness of breath. Gastrointestinal: As above.  No constipation. Genitourinary: Negative for dysuria. Musculoskeletal: Negative for back  pain. Skin: Negative for rash. Neurological: Negative for headaches, focal weakness or numbness.  __________________________________________   PHYSICAL EXAM:  VITAL SIGNS: ED Triage Vitals  Enc Vitals Group     BP 01/08/17 1239 (!) 148/57     Pulse Rate 01/08/17 1239 79     Resp 01/08/17 1239 18     Temp 01/08/17 1239 97.9 F (36.6 C)     Temp Source 01/08/17 1239 Oral     SpO2 01/08/17 1239 100 %     Weight 01/08/17 1240 128 lb (58.1 kg)     Height 01/08/17 1240 5\' 8"  (1.727 m)     Head Circumference --      Peak Flow --      Pain Score --      Pain Loc --      Pain Edu? --      Excl. in [place]? --    Constitutional: Alert and oriented. Well appearing and in no acute distress. Eyes: Conjunctivae are normal. PERRL. EOMI. ENT      Head: Normocephalic and atraumatic.      Mouth/Throat: Mucous membranes are moist.Oropharynx non-erythematous. Cardiovascular: Normal rate, regular rhythm. Grossly normal heart sounds.  Good peripheral circulation. Respiratory: Normal respiratory effort without tachypnea nor retractions. Breath  sounds are clear and equal bilaterally. No wheezes, rales, rhonchi. Gastrointestinal: soft. Mild diffuse lower abdominal tenderness. Normal Bowel sounds. No CVA tenderness. Musculoskeletal:  Steady gait. No midline cervical, thoracic or lumbar tenderness to palpation.  Neurologic:  Normal speech and language.Speech is normal. No gait instability.  Skin:  Skin is warm, dry and intact. No rash noted. Psychiatric: Mood and affect are normal. Speech and behavior are normal. Patient exhibits appropriate insight and judgment   ___________________________________________   LABS (all labs ordered are listed, but only abnormal results are displayed)  Labs Reviewed  CBC WITH DIFFERENTIAL/PLATELET - Abnormal; Notable for the following:       Result Value   RBC 3.72 (*)    Hemoglobin 11.6 (*)    HCT 34.6 (*)    All other components within normal limits   COMPREHENSIVE METABOLIC PANEL - Abnormal; Notable for the following:    Potassium 3.1 (*)    Chloride 98 (*)    Glucose, Bld 114 (*)    AST 55 (*)    GFR calc non Af Amer 59 (*)    All other components within normal limits  URINALYSIS, COMPLETE (UACMP) WITH MICROSCOPIC - Abnormal; Notable for the following:    Hgb urine dipstick SMALL (*)    Leukocytes, UA TRACE (*)    Squamous Epithelial / LPF 0-5 (*)    Bacteria, UA FEW (*)    All other components within normal limits  MAGNESIUM  OCCULT BLOOD X 1 CARD TO LAB, STOOL    RADIOLOGY  Dg Abdomen 1 View  Result Date: 01/08/2017 CLINICAL DATA:  Vomiting, diarrhea EXAM: ABDOMEN - 1 VIEW COMPARISON:  04/12/2012 FINDINGS: Mild gaseous distended small bowel loops are noted mid abdomen. There is moderate diffuse colonic distension with gas and some colonic stool. Findings highly suspicious for diffuse ileus. IMPRESSION: Mild gaseous distended small bowel loops. Moderate colonic distension with gas and some colonic stool. Findings suspicious for diffuse ileus. Electronically Signed   By: Lahoma Crocker M.D.   On: 01/08/2017 14:44   ____________________________________________   PROCEDURES Procedures    INITIAL IMPRESSION / ASSESSMENT AND PLAN / ED COURSE  Pertinent labs & imaging results that were available during my care of the patient were reviewed by me and considered in my medical decision making (see chart for details).  Overall well-appearing patient. Patient has been having vomiting and diarrhea intermittently for over a week, with accompanying weakness.. Zofran given in urgent care and patient tolerating oral fluids. 20 mEq potassium given orally once in urgent care as potassium 3.1. Discussed in detail with patient regarding potassium monitoring and follow-up as her current blood pressure medications including diuretics. Patient abdominal KUB showing findings suspicious for diffuse ileus. Recommend patient proceed to emergency room for  further evaluation and monitoring possible IV fluids patient verbalized understanding and agreed to this plan and states that she will have her family transported her to [facility], called and given report.   Discussed follow up with Primary care physician this week. Discussed follow up and return parameters including no resolution or any worsening concerns. Patient verbalized understanding and agreed to plan.   ____________________________________________   FINAL CLINICAL IMPRESSION(S) / ED DIAGNOSES  Final diagnoses:  Nausea vomiting and diarrhea  Hypokalemia  Ileus [facility])     Discharge Medication List as of 01/08/2017  3:07 PM      Note: This dictation was prepared with Dragon dictation along with smaller phrase technology. Any transcriptional errors that result from  this process are unintentional.         Marylene Land, NP 01/08/17 1759

## 2017-01-08 NOTE — ED Notes (Signed)
P.o. Challenge given with ginger ale

## 2017-01-18 ENCOUNTER — Encounter: Payer: Self-pay | Admitting: Psychiatry

## 2017-01-18 ENCOUNTER — Ambulatory Visit (INDEPENDENT_AMBULATORY_CARE_PROVIDER_SITE_OTHER): Payer: 59 | Admitting: Psychiatry

## 2017-01-18 VITALS — BP 152/64 | HR 68 | Temp 97.7°F | Wt 122.2 lb

## 2017-01-18 DIAGNOSIS — F331 Major depressive disorder, recurrent, moderate: Secondary | ICD-10-CM

## 2017-01-18 DIAGNOSIS — R4189 Other symptoms and signs involving cognitive functions and awareness: Secondary | ICD-10-CM

## 2017-01-18 DIAGNOSIS — R4689 Other symptoms and signs involving appearance and behavior: Secondary | ICD-10-CM

## 2017-01-18 MED ORDER — BUPROPION HCL 75 MG PO TABS: 75.0000 mg | ORAL_TABLET | Freq: Every morning | ORAL | 2 refills | Status: DC

## 2017-01-18 MED ORDER — ESCITALOPRAM OXALATE 20 MG PO TABS: 20.0000 mg | ORAL_TABLET | Freq: Every day | ORAL | 2 refills | Status: DC

## 2017-01-18 MED ORDER — MEMANTINE HCL 10 MG PO TABS: 10.0000 mg | ORAL_TABLET | Freq: Every day | ORAL | 3 refills | Status: DC

## 2017-01-18 NOTE — Progress Notes (Signed)
Psychiatric MD Progress Note  Patient Identification: Lori Gilmore MRN:  [id number] Date of Evaluation:  01/18/2017 Referral Source: [facility]  Chief Complaint:   Chief Complaint    Follow-up; Medication Refill     Visit Diagnosis:    ICD-9-CM ICD-10-CM   1. MDD (major depressive disorder), recurrent episode, moderate (HCC) 296.32 F33.1   2. Cognitive and behavioral changes 799.59 R41.89    312.9 R46.89     History of Present Illness:   Patient is a 83 year-old widowed female who presented for Follow-up. She was referred by [facility]. Patient reported that she was Dehydrated 10 days ago and she went to the emergency room. Patient reported that she was treated in the ER and now she is feeling anxious at times. We discussed about her medications. She interested in having her medications adjusted at this time. She reported that she does live and wants to feel healthy. She currently denied having any suicidal homicidal ideations or plans.  Patient reported that she has been compliant with them and she takes them regularly. She still lives with her daughter and they are very supportive of her. She remains pleasant and cooperative during the interview.     Associated Signs/Symptoms: Depression Symptoms:  difficulty concentrating, impaired memory, anxiety, (Hypo) Manic Symptoms:  none Anxiety Symptoms:  Excessive Worry, Psychotic Symptoms:  none PTSD Symptoms: Negative NA  Past Psychiatric History:   Patient has history of depression and has been following with Dr. Bridgett Larsson and [facility]. She reported that she has history of multiple psychiatric hospitalizations in the past when her daughter passed away at the age of 85.  Previous Psychotropic Medications: She does not remember the name of her previous medications.  Substance Abuse History in the last 12 months:  No.  Consequences of Substance Abuse: Negative NA  Past Medical  History:  Past Medical History:  Diagnosis Date  . Anemia   . Arthritis    right knee  . Blood clots in brain   . Depression   . Diabetes mellitus without complication ([place])    Diet controlled  . Headache    sinus  . History of hiatal hernia   . Hypercholesteremia   . Hypertension   . Renal cancer ([place])    right kidney  . Seasonal allergies   . TIA (transient ischemic attack) 2000   no deficits  . Wears dentures    full upper and lower    Past Surgical History:  Procedure Laterality Date  . ABDOMINAL HYSTERECTOMY    . APPENDECTOMY    . BRAIN SURGERY     for blood clot  . BREAST BIOPSY Left 03/21/13   u/s bx-neg  . BREAST BIOPSY Left    neg  . BREAST CYST ASPIRATION Left    neg  . CATARACT EXTRACTION W/PHACO Right 02/13/2015   Procedure: CATARACT EXTRACTION PHACO AND INTRAOCULAR LENS PLACEMENT (IOC);  Surgeon: Leandrew Koyanagi, MD;  Location: [employer];  Service: Ophthalmology;  Laterality: Right;  DIABETIC  . CATARACT EXTRACTION W/PHACO Left 03/20/2015   Procedure: CATARACT EXTRACTION PHACO AND INTRAOCULAR LENS PLACEMENT (IOC);  Surgeon: Leandrew Koyanagi, MD;  Location: [place];  Service: Ophthalmology;  Laterality: Left;  . CHOLECYSTECTOMY      Family Psychiatric History:  Pt denied   Family History:  Family History  Problem Relation Age of Onset  . Diabetes Daughter   . Breast cancer Neg Hx     Social History:   Social  History   Social History  . Marital status: Married    Spouse name: N/A  . Number of children: N/A  . Years of education: N/A   Social History Main Topics  . Smoking status: Never Smoker  . Smokeless tobacco: Never Used  . Alcohol use No  . Drug use: No  . Sexual activity: No   Other Topics Concern  . None   Social History Narrative  . None    Additional Social History:  Patient is currently widowed. She is living with her daughter.  Allergies:   Allergies  Allergen Reactions  . Erythromycin  Other (See Comments)  . Oxycodone-Acetaminophen Nausea And Vomiting  . Oxycodone-Acetaminophen Nausea And Vomiting  . Tapentadol Other (See Comments)  . Amoxicillin Nausea Only  . Ezetimibe     Other reaction(s): Unknown  . Gabapentin Other (See Comments)    dizzy  . Glucosamine-Chondroitin     Other reaction(s): Unknown  . Lovastatin Other (See Comments)  . Omeprazole Nausea Only  . Azithromycin Nausea Only  . Erythromycin Base Rash  . Heparin Rash and Other (See Comments)  . Other Rash    Metabolic Disorder Labs: Lab Results  Component Value Date   HGBA1C 7.7 (H) 09/10/2016   MPG 174 09/10/2016   No results found for: PROLACTIN Lab Results  Component Value Date   CHOL 122 04/20/2013   TRIG 19 04/20/2013   HDL 66 (H) 04/20/2013   VLDL 4 (L) 04/20/2013   LDLCALC 52 04/20/2013   LDLCALC 57 12/13/2011     Current Medications: Current Outpatient Prescriptions  Medication Sig Dispense Refill  . ACCU-CHEK SOFTCLIX LANCETS lancets     . aspirin 81 MG tablet Take 81 mg by mouth daily. AM    . atorvastatin (LIPITOR) 10 MG tablet Take 10 mg by mouth daily. AM    . buPROPion (WELLBUTRIN XL) 150 MG 24 hr tablet Take 1 tablet (150 mg total) by mouth daily. AM 90 tablet 3  . Calcium Carb-Cholecalciferol (CALCIUM-VITAMIN D) 500-400 MG-UNIT TABS Take 1 tablet by mouth daily.     Marland Kitchen escitalopram (LEXAPRO) 20 MG tablet Take 1 tablet (20 mg total) by mouth daily. AM 90 tablet 2  . lisinopril-hydrochlorothiazide (PRINZIDE,ZESTORETIC) 20-12.5 MG tablet Take 1 tablet by mouth daily.    . memantine (NAMENDA) 10 MG tablet Take 1 tablet (10 mg total) by mouth daily. PM 90 tablet 3  . metoCLOPramide (REGLAN) 10 MG tablet Take 1 tablet (10 mg total) by mouth every 6 (six) hours as needed for nausea. 6 tablet 0  . Multiple Vitamin (THERA) TABS Take 1 tablet by mouth daily.     . saxagliptin HCl (ONGLYZA) 2.5 MG TABS tablet Take 2.5 mg by mouth daily.    . vitamin B-12 (CYANOCOBALAMIN) 500 MCG  tablet Take 500 mcg by mouth daily. AM     No current facility-administered medications for this visit.     Neurologic: Headache: No Seizure: No Paresthesias:No  Musculoskeletal: Strength & Muscle Tone: within normal limits Gait & Station: normal Patient leans: N/A  Psychiatric Specialty Exam: Review of Systems  Musculoskeletal: Positive for back pain and neck pain.  Neurological: Positive for tingling.  Endo/Heme/Allergies: Positive for environmental allergies.  Psychiatric/Behavioral: Positive for depression. The patient is nervous/anxious.   All other systems reviewed and are negative.   Blood pressure (!) 152/64, pulse 68, temperature 97.7 F (36.5 C), temperature source Oral, weight 122 lb 3.2 oz (55.4 kg).Body mass index is 18.58 kg/m.  General Appearance:  Fairly Groomed  Eye Contact:  Fair  Speech:  Clear and Coherent and Normal Rate  Volume:  Normal  Mood:  Euthymic  Affect:  Appropriate and Congruent  Thought Process:  Coherent and Goal Directed  Orientation:  Full (Time, Place, and Person)  Thought Content:  WDL  Suicidal Thoughts:  No  Homicidal Thoughts:  No  Memory:  Immediate;   Fair  Judgement:  Fair  Insight:  Fair  Psychomotor Activity:  Normal  Concentration:  Fair  Recall:  [employer] of Knowledge:Fair  Language: Fair  Akathisia:  No  Handed:  Right  AIMS (if indicated):    Assets:  Communication Skills Desire for Improvement Physical Health Social Support  ADL's:  Intact  Cognition: WNL  Sleep:      Treatment Plan Summary: Medication management    Discussed with patient about her medications treatment risk benefits and alternatives. I will decrease her Wellbutrin 75 mg in the morning   Lexapro 20 mg daily She will continue on Namenda 10 mg daily. Patient was given 3 months supply of the medications.  Follow-up in 1 months or earlier depending on her symptoms.     More than 50% of the time spent in psychoeducation, counseling  and coordination of care.    This note was generated in part or whole with voice recognition software. Voice regonition is usually quite accurate but there are transcription errors that can and very often do occur. I apologize for any typographical errors that were not detected and corrected.    Rainey Pines, MD 4/30/201810:20 AM

## 2017-02-17 ENCOUNTER — Ambulatory Visit (INDEPENDENT_AMBULATORY_CARE_PROVIDER_SITE_OTHER): Payer: 59 | Admitting: Psychiatry

## 2017-02-17 ENCOUNTER — Encounter: Payer: Self-pay | Admitting: Psychiatry

## 2017-02-17 VITALS — BP 132/60 | HR 68 | Temp 98.7°F | Wt 123.4 lb

## 2017-02-17 DIAGNOSIS — R4689 Other symptoms and signs involving appearance and behavior: Secondary | ICD-10-CM | POA: Diagnosis not present

## 2017-02-17 DIAGNOSIS — F331 Major depressive disorder, recurrent, moderate: Secondary | ICD-10-CM | POA: Diagnosis not present

## 2017-02-17 DIAGNOSIS — R4189 Other symptoms and signs involving cognitive functions and awareness: Secondary | ICD-10-CM | POA: Diagnosis not present

## 2017-02-17 MED ORDER — MEMANTINE HCL 10 MG PO TABS: 10.0000 mg | ORAL_TABLET | Freq: Every day | ORAL | 3 refills | Status: DC

## 2017-02-17 MED ORDER — BUPROPION HCL 75 MG PO TABS: 75.0000 mg | ORAL_TABLET | Freq: Every morning | ORAL | 2 refills | Status: DC

## 2017-02-17 MED ORDER — ESCITALOPRAM OXALATE 20 MG PO TABS: 20.0000 mg | ORAL_TABLET | Freq: Every day | ORAL | 2 refills | Status: DC

## 2017-02-17 NOTE — Progress Notes (Signed)
Psychiatric MD Progress Note  Patient Identification: Lori Gilmore MRN:  [id number] Date of Evaluation:  02/17/2017 Referral Source: [facility]  Chief Complaint:   Chief Complaint    Follow-up; Medication Refill     Visit Diagnosis:    ICD-9-CM ICD-10-CM   1. MDD (major depressive disorder), recurrent episode, moderate (HCC) 296.32 F33.1   2. Cognitive and behavioral changes 799.59 R41.89    312.9 R46.89     History of Present Illness:   Patient is a 83 year-old widowed female who presented for Follow-up. She Reported that she is currently experiencing relationship issues with her daughter. Patient currently lives with her daughter. She reported that her daughter is not taking care of her and she feels isolated. However she currently lives with them. She stated that she enjoys going out and spending time with her friends. She stated that she has been paying $100 to her daughter on a monthly basis. She stated that she spends time with her friends. Patient stated that she has been taking her medications as prescribed. She denied having any suicidal homicidal ideations or plans. She denied having any perceptual disturbances. We discussed about her medications in detail. Patient reported that her medications are helping her.   She remains pleasant and cooperative during the interview.     Associated Signs/Symptoms: Depression Symptoms:  difficulty concentrating, impaired memory, anxiety, (Hypo) Manic Symptoms:  none Anxiety Symptoms:  Excessive Worry, Psychotic Symptoms:  none PTSD Symptoms: Negative NA  Past Psychiatric History:   Patient has history of depression and has been following with Dr. Bridgett Larsson and [facility]. She reported that she has history of multiple psychiatric hospitalizations in the past when her daughter passed away at the age of 24.  Previous Psychotropic Medications: She does not remember the name of her previous  medications.  Substance Abuse History in the last 12 months:  No.  Consequences of Substance Abuse: Negative NA  Past Medical History:  Past Medical History:  Diagnosis Date  . Anemia   . Arthritis    right knee  . Blood clots in brain   . Depression   . Diabetes mellitus without complication ([place])    Diet controlled  . Headache    sinus  . History of hiatal hernia   . Hypercholesteremia   . Hypertension   . Renal cancer ([place])    right kidney  . Seasonal allergies   . TIA (transient ischemic attack) 2000   no deficits  . Wears dentures    full upper and lower    Past Surgical History:  Procedure Laterality Date  . ABDOMINAL HYSTERECTOMY    . APPENDECTOMY    . BRAIN SURGERY     for blood clot  . BREAST BIOPSY Left 03/21/13   u/s bx-neg  . BREAST BIOPSY Left    neg  . BREAST CYST ASPIRATION Left    neg  . CATARACT EXTRACTION W/PHACO Right 02/13/2015   Procedure: CATARACT EXTRACTION PHACO AND INTRAOCULAR LENS PLACEMENT (IOC);  Surgeon: Leandrew Koyanagi, MD;  Location: [place];  Service: Ophthalmology;  Laterality: Right;  DIABETIC  . CATARACT EXTRACTION W/PHACO Left 03/20/2015   Procedure: CATARACT EXTRACTION PHACO AND INTRAOCULAR LENS PLACEMENT (IOC);  Surgeon: Leandrew Koyanagi, MD;  Location: [place];  Service: Ophthalmology;  Laterality: Left;  . CHOLECYSTECTOMY      Family Psychiatric History:  Pt denied   Family History:  Family History  Problem Relation Age of Onset  . Diabetes Daughter   .  Breast cancer Neg Hx     Social History:   Social History   Social History  . Marital status: Married    Spouse name: N/A  . Number of children: N/A  . Years of education: N/A   Social History Main Topics  . Smoking status: Never Smoker  . Smokeless tobacco: Never Used  . Alcohol use No  . Drug use: No  . Sexual activity: No   Other Topics Concern  . None   Social History Narrative  . None    Additional Social History:   Patient is currently widowed. She is living with her daughter.  Allergies:   Allergies  Allergen Reactions  . Erythromycin Other (See Comments)  . Oxycodone-Acetaminophen Nausea And Vomiting  . Oxycodone-Acetaminophen Nausea And Vomiting  . Tapentadol Other (See Comments)  . Amoxicillin Nausea Only  . Ezetimibe     Other reaction(s): Unknown  . Gabapentin Other (See Comments)    dizzy  . Glucosamine-Chondroitin     Other reaction(s): Unknown  . Lovastatin Other (See Comments)  . Omeprazole Nausea Only  . Azithromycin Nausea Only  . Erythromycin Base Rash  . Heparin Rash and Other (See Comments)  . Other Rash    Metabolic Disorder Labs: Lab Results  Component Value Date   HGBA1C 7.7 (H) 09/10/2016   MPG 174 09/10/2016   No results found for: PROLACTIN Lab Results  Component Value Date   CHOL 122 04/20/2013   TRIG 19 04/20/2013   HDL 66 (H) 04/20/2013   VLDL 4 (L) 04/20/2013   LDLCALC 52 04/20/2013   LDLCALC 57 12/13/2011     Current Medications: Current Outpatient Prescriptions  Medication Sig Dispense Refill  . ACCU-CHEK SOFTCLIX LANCETS lancets     . aspirin 81 MG tablet Take 81 mg by mouth daily. AM    . atorvastatin (LIPITOR) 10 MG tablet Take 10 mg by mouth daily. AM    . buPROPion (WELLBUTRIN) 75 MG tablet Take 1 tablet (75 mg total) by mouth every morning. 30 tablet 2  . Calcium Carb-Cholecalciferol (CALCIUM-VITAMIN D) 500-400 MG-UNIT TABS Take 1 tablet by mouth daily.     Marland Kitchen escitalopram (LEXAPRO) 20 MG tablet Take 1 tablet (20 mg total) by mouth daily. AM 90 tablet 2  . lisinopril-hydrochlorothiazide (PRINZIDE,ZESTORETIC) 20-12.5 MG tablet Take 1 tablet by mouth daily.    . memantine (NAMENDA) 10 MG tablet Take 1 tablet (10 mg total) by mouth daily. PM 90 tablet 3  . metoCLOPramide (REGLAN) 10 MG tablet Take 1 tablet (10 mg total) by mouth every 6 (six) hours as needed for nausea. 6 tablet 0  . Multiple Vitamin (THERA) TABS Take 1 tablet by mouth daily.      . saxagliptin HCl (ONGLYZA) 2.5 MG TABS tablet Take 2.5 mg by mouth daily.    . vitamin B-12 (CYANOCOBALAMIN) 500 MCG tablet Take 500 mcg by mouth daily. AM     No current facility-administered medications for this visit.     Neurologic: Headache: No Seizure: No Paresthesias:No  Musculoskeletal: Strength & Muscle Tone: within normal limits Gait & Station: normal Patient leans: N/A  Psychiatric Specialty Exam: Review of Systems  Musculoskeletal: Positive for back pain and neck pain.  Neurological: Positive for tingling.  Endo/Heme/Allergies: Positive for environmental allergies.  Psychiatric/Behavioral: Positive for depression. The patient is nervous/anxious.   All other systems reviewed and are negative.   Blood pressure 132/60, pulse 68, temperature 98.7 F (37.1 C), temperature source Oral, weight 123 lb 6.4 oz (56  kg).Body mass index is 18.76 kg/m.  General Appearance: Fairly Groomed  Eye Contact:  Fair  Speech:  Clear and Coherent and Normal Rate  Volume:  Normal  Mood:  Euthymic  Affect:  Appropriate and Congruent  Thought Process:  Coherent and Goal Directed  Orientation:  Full (Time, Place, and Person)  Thought Content:  WDL  Suicidal Thoughts:  No  Homicidal Thoughts:  No  Memory:  Immediate;   Fair  Judgement:  Fair  Insight:  Fair  Psychomotor Activity:  Normal  Concentration:  Fair  Recall:  [employer] of Knowledge:Fair  Language: Fair  Akathisia:  No  Handed:  Right  AIMS (if indicated):    Assets:  Communication Skills Desire for Improvement Physical Health Social Support  ADL's:  Intact  Cognition: WNL  Sleep:      Treatment Plan Summary: Medication management    Discussed with patient about her medications treatment risk benefits and alternatives. Continue  Wellbutrin 75 mg in the morning   Lexapro 20 mg daily She will continue on Namenda 10 mg daily. Patient was given 3 months supply of the medications.  Follow-up in 3 months or  earlier depending on her symptoms.     More than 50% of the time spent in psychoeducation, counseling and coordination of care.    This note was generated in part or whole with voice recognition software. Voice regonition is usually quite accurate but there are transcription errors that can and very often do occur. I apologize for any typographical errors that were not detected and corrected.    Rainey Pines, MD 5/30/201810:58 AM

## 2017-05-17 ENCOUNTER — Ambulatory Visit (INDEPENDENT_AMBULATORY_CARE_PROVIDER_SITE_OTHER): Payer: Medicare Other | Admitting: Psychiatry

## 2017-05-17 ENCOUNTER — Encounter: Payer: Self-pay | Admitting: Psychiatry

## 2017-05-17 VITALS — BP 137/62 | HR 72 | Temp 99.1°F | Wt 126.6 lb

## 2017-05-17 DIAGNOSIS — F331 Major depressive disorder, recurrent, moderate: Secondary | ICD-10-CM

## 2017-05-17 MED ORDER — MEMANTINE HCL 10 MG PO TABS: 10.0000 mg | ORAL_TABLET | Freq: Every day | ORAL | 3 refills | Status: DC

## 2017-05-17 MED ORDER — BUPROPION HCL 75 MG PO TABS: 75.0000 mg | ORAL_TABLET | Freq: Every morning | ORAL | 2 refills | Status: DC

## 2017-05-17 MED ORDER — ESCITALOPRAM OXALATE 20 MG PO TABS: 20.0000 mg | ORAL_TABLET | Freq: Every day | ORAL | 2 refills | Status: DC

## 2017-05-17 NOTE — Progress Notes (Signed)
Psychiatric MD Progress Note  Patient Identification: Lori Gilmore MRN:  [id number] Date of Evaluation:  05/17/2017 Referral Source: [facility]  Chief Complaint:   Chief Complaint    Follow-up; Medication Refill     Visit Diagnosis:    ICD-10-CM   1. MDD (major depressive disorder), recurrent episode, moderate (HCC) F33.1     History of Present Illness:   Patient is a 83 year-old widowed female who presented for follow-up. She Reported that she continues to experiencing relationship issues with her daughter. Patient ported that she has been trying to keep herself busy and she has good relationship with her other daughter who lives in [place]. She reported that her sister lives in [place] and she works in [place]. She has been spending time with her other daughter as well as with her sister. She also has friends in the area. She stated that she spends time in the yard and also takes care of herself. She stated that she always thinks about positive things and has been trying to feel happy. She reported that she does not care about her daughter and her kids who ignores her at the house. She reported that her daughter is the POA and she does not allow the patient to move out of the house. She reported that she was getting a ride from her grandson's fianc but now they have a relationship issue and she has moved out of the house and is going to relocate to [place]. Patient reported that she has been sleeping and eating well. She does not have any acute issues with her medications.   She appeared pleasant and cooperative during the interview.    Associated Signs/Symptoms: Depression Symptoms:  difficulty concentrating, impaired memory, anxiety, (Hypo) Manic Symptoms:  none Anxiety Symptoms:  Excessive Worry, Psychotic Symptoms:  none PTSD Symptoms: Negative NA  Past Psychiatric History:   Patient has history of depression and has been following with Dr. Bridgett Larsson and  [facility]. She reported that she has history of multiple psychiatric hospitalizations in the past when her daughter passed away at the age of 10.  Previous Psychotropic Medications: She does not remember the name of her previous medications.  Substance Abuse History in the last 12 months:  No.  Consequences of Substance Abuse: Negative NA  Past Medical History:  Past Medical History:  Diagnosis Date  . Anemia   . Arthritis    right knee  . Blood clots in brain   . Depression   . Diabetes mellitus without complication ([place])    Diet controlled  . Headache    sinus  . History of hiatal hernia   . Hypercholesteremia   . Hypertension   . Renal cancer ([place])    right kidney  . Seasonal allergies   . TIA (transient ischemic attack) 2000   no deficits  . Wears dentures    full upper and lower    Past Surgical History:  Procedure Laterality Date  . ABDOMINAL HYSTERECTOMY    . APPENDECTOMY    . BRAIN SURGERY     for blood clot  . BREAST BIOPSY Left 03/21/13   u/s bx-neg  . BREAST BIOPSY Left    neg  . BREAST CYST ASPIRATION Left    neg  . CATARACT EXTRACTION W/PHACO Right 02/13/2015   Procedure: CATARACT EXTRACTION PHACO AND INTRAOCULAR LENS PLACEMENT (IOC);  Surgeon: Leandrew Koyanagi, MD;  Location: [place];  Service: Ophthalmology;  Laterality: Right;  DIABETIC  . CATARACT  EXTRACTION W/PHACO Left 03/20/2015   Procedure: CATARACT EXTRACTION PHACO AND INTRAOCULAR LENS PLACEMENT (IOC);  Surgeon: Leandrew Koyanagi, MD;  Location: [place];  Service: Ophthalmology;  Laterality: Left;  . CHOLECYSTECTOMY      Family Psychiatric History:  Pt denied   Family History:  Family History  Problem Relation Age of Onset  . Diabetes Daughter   . Breast cancer Neg Hx     Social History:   Social History   Social History  . Marital status: Married    Spouse name: N/A  . Number of children: N/A  . Years of education: N/A   Social  History Main Topics  . Smoking status: Never Smoker  . Smokeless tobacco: Never Used  . Alcohol use No  . Drug use: No  . Sexual activity: No   Other Topics Concern  . None   Social History Narrative  . None    Additional Social History:  Patient is currently widowed. She is living with her daughter.  Allergies:   Allergies  Allergen Reactions  . Erythromycin Other (See Comments)  . Oxycodone-Acetaminophen Nausea And Vomiting  . Oxycodone-Acetaminophen Nausea And Vomiting  . Tapentadol Other (See Comments)  . Amoxicillin Nausea Only  . Ezetimibe     Other reaction(s): Unknown  . Gabapentin Other (See Comments)    dizzy  . Glucosamine-Chondroitin     Other reaction(s): Unknown  . Lovastatin Other (See Comments)  . Omeprazole Nausea Only  . Azithromycin Nausea Only  . Erythromycin Base Rash  . Heparin Rash and Other (See Comments)  . Other Rash    Metabolic Disorder Labs: Lab Results  Component Value Date   HGBA1C 7.7 (H) 09/10/2016   MPG 174 09/10/2016   No results found for: PROLACTIN Lab Results  Component Value Date   CHOL 122 04/20/2013   TRIG 19 04/20/2013   HDL 66 (H) 04/20/2013   VLDL 4 (L) 04/20/2013   LDLCALC 52 04/20/2013   LDLCALC 57 12/13/2011     Current Medications: Current Outpatient Prescriptions  Medication Sig Dispense Refill  . ACCU-CHEK SOFTCLIX LANCETS lancets     . aspirin 81 MG tablet Take 81 mg by mouth daily. AM    . atorvastatin (LIPITOR) 10 MG tablet Take 10 mg by mouth daily. AM    . buPROPion (WELLBUTRIN) 75 MG tablet Take 1 tablet (75 mg total) by mouth every morning. 30 tablet 2  . Calcium Carb-Cholecalciferol (CALCIUM-VITAMIN D) 500-400 MG-UNIT TABS Take 1 tablet by mouth daily.     Marland Kitchen escitalopram (LEXAPRO) 20 MG tablet Take 1 tablet (20 mg total) by mouth daily. AM 90 tablet 2  . lisinopril-hydrochlorothiazide (PRINZIDE,ZESTORETIC) 20-12.5 MG tablet Take 1 tablet by mouth daily.    . memantine (NAMENDA) 10 MG tablet  Take 1 tablet (10 mg total) by mouth daily. PM 90 tablet 3  . metoCLOPramide (REGLAN) 10 MG tablet Take 1 tablet (10 mg total) by mouth every 6 (six) hours as needed for nausea. 6 tablet 0  . Multiple Vitamin (THERA) TABS Take 1 tablet by mouth daily.     . saxagliptin HCl (ONGLYZA) 2.5 MG TABS tablet Take 2.5 mg by mouth daily.    . vitamin B-12 (CYANOCOBALAMIN) 500 MCG tablet Take 500 mcg by mouth daily. AM     No current facility-administered medications for this visit.     Neurologic: Headache: No Seizure: No Paresthesias:No  Musculoskeletal: Strength & Muscle Tone: within normal limits Gait & Station: normal Patient leans: N/A  Psychiatric Specialty Exam: Review of Systems  Musculoskeletal: Positive for back pain and neck pain.  Neurological: Positive for tingling.  Endo/Heme/Allergies: Positive for environmental allergies.  Psychiatric/Behavioral: Positive for depression. The patient is nervous/anxious.   All other systems reviewed and are negative.   Blood pressure 137/62, pulse 72, temperature 99.1 F (37.3 C), temperature source Oral, weight 126 lb 9.6 oz (57.4 kg).Body mass index is 19.25 kg/m.  General Appearance: Fairly Groomed  Eye Contact:  Fair  Speech:  Clear and Coherent and Normal Rate  Volume:  Normal  Mood:  Euthymic  Affect:  Appropriate and Congruent  Thought Process:  Coherent and Goal Directed  Orientation:  Full (Time, Place, and Person)  Thought Content:  WDL  Suicidal Thoughts:  No  Homicidal Thoughts:  No  Memory:  Immediate;   Fair  Judgement:  Fair  Insight:  Fair  Psychomotor Activity:  Normal  Concentration:  Fair  Recall:  [employer] of Knowledge:Fair  Language: Fair  Akathisia:  No  Handed:  Right  AIMS (if indicated):    Assets:  Communication Skills Desire for Improvement Physical Health Social Support  ADL's:  Intact  Cognition: WNL  Sleep:      Treatment Plan Summary: Medication management    Discussed with patient  about her medications treatment risk benefits and alternatives. Continue  Wellbutrin 75 mg in the morning   Lexapro 20 mg daily She will continue on Namenda 10 mg daily. Patient was given 3 months supply of the medications.  Follow-up in 1 months or earlier depending on her symptoms.     More than 50% of the time spent in psychoeducation, counseling and coordination of care.    This note was generated in part or whole with voice recognition software. Voice regonition is usually quite accurate but there are transcription errors that can and very often do occur. I apologize for any typographical errors that were not detected and corrected.    Rainey Pines, MD 8/27/201811:54 AM

## 2017-05-31 ENCOUNTER — Ambulatory Visit: Payer: Medicare Other | Admitting: Psychiatry

## 2017-06-28 ENCOUNTER — Ambulatory Visit (INDEPENDENT_AMBULATORY_CARE_PROVIDER_SITE_OTHER): Payer: Medicare Other | Admitting: Psychiatry

## 2017-06-28 ENCOUNTER — Encounter: Payer: Self-pay | Admitting: Psychiatry

## 2017-06-28 VITALS — BP 147/83 | HR 77 | Temp 98.1°F | Wt 126.6 lb

## 2017-06-28 DIAGNOSIS — F331 Major depressive disorder, recurrent, moderate: Secondary | ICD-10-CM | POA: Diagnosis not present

## 2017-06-28 DIAGNOSIS — R4189 Other symptoms and signs involving cognitive functions and awareness: Secondary | ICD-10-CM

## 2017-06-28 DIAGNOSIS — R4689 Other symptoms and signs involving appearance and behavior: Secondary | ICD-10-CM | POA: Diagnosis not present

## 2017-06-28 MED ORDER — BUPROPION HCL 75 MG PO TABS: 75.0000 mg | ORAL_TABLET | Freq: Every morning | ORAL | 2 refills | Status: DC

## 2017-06-28 MED ORDER — ESCITALOPRAM OXALATE 20 MG PO TABS: 20.0000 mg | ORAL_TABLET | Freq: Every day | ORAL | 2 refills | Status: DC

## 2017-06-28 NOTE — Progress Notes (Signed)
Psychiatric MD Progress Note  Patient Identification: Lori Gilmore MRN:  [id number] Date of Evaluation:  06/28/2017 Referral Source: [facility]  Chief Complaint:   Chief Complaint    Follow-up; Medication Refill     Visit Diagnosis:    ICD-10-CM   1. MDD (major depressive disorder), recurrent episode, moderate (HCC) F33.1   2. Cognitive and behavioral changes R41.89    R46.89     History of Present Illness:   Patient is a 83 year-old widowed female who presented for follow-up. She Reported that sheHas been spending time with her daughter who is recuperating from her leg surgery. She stated that she has good relationship with her daughter now. Her daughter is appreciated of her symptoms. Patient reported that she is compliant with her medications. She has been spending time with her grandchildren and is planning for the holidays. She appears happy and calm during the interview. Patient reported that she is concerned about her brother-in-law who lives in [place] and fell and and she is planning to visit them. She stated that she enjoys life and she has been very thankful that she has been taking her medications as prescribed and they have been helpful. She denied having any suicidal homicidal ideations or plans. She has friends and her family in the area. She appeared receptive to her medications at this time.     She appeared pleasant and cooperative during the interview.    Associated Signs/Symptoms: Depression Symptoms:  difficulty concentrating, impaired memory, anxiety, (Hypo) Manic Symptoms:  none Anxiety Symptoms:  Excessive Worry, Psychotic Symptoms:  none PTSD Symptoms: Negative NA  Past Psychiatric History:   Patient has history of depression and has been following with Dr. Bridgett Larsson and [facility]. She reported that she has history of multiple psychiatric hospitalizations in the past when her daughter passed away at the age of  44.  Previous Psychotropic Medications: She does not remember the name of her previous medications.  Substance Abuse History in the last 12 months:  No.  Consequences of Substance Abuse: Negative NA  Past Medical History:  Past Medical History:  Diagnosis Date  . Anemia   . Arthritis    right knee  . Blood clots in brain   . Depression   . Diabetes mellitus without complication ([place])    Diet controlled  . Headache    sinus  . History of hiatal hernia   . Hypercholesteremia   . Hypertension   . Renal cancer ([place])    right kidney  . Seasonal allergies   . TIA (transient ischemic attack) 2000   no deficits  . Wears dentures    full upper and lower    Past Surgical History:  Procedure Laterality Date  . ABDOMINAL HYSTERECTOMY    . APPENDECTOMY    . BRAIN SURGERY     for blood clot  . BREAST BIOPSY Left 03/21/13   u/s bx-neg  . BREAST BIOPSY Left    neg  . BREAST CYST ASPIRATION Left    neg  . CATARACT EXTRACTION W/PHACO Right 02/13/2015   Procedure: CATARACT EXTRACTION PHACO AND INTRAOCULAR LENS PLACEMENT (IOC);  Surgeon: Leandrew Koyanagi, MD;  Location: [place];  Service: Ophthalmology;  Laterality: Right;  DIABETIC  . CATARACT EXTRACTION W/PHACO Left 03/20/2015   Procedure: CATARACT EXTRACTION PHACO AND INTRAOCULAR LENS PLACEMENT (IOC);  Surgeon: Leandrew Koyanagi, MD;  Location: [place];  Service: Ophthalmology;  Laterality: Left;  . CHOLECYSTECTOMY      Family Psychiatric  History:  Pt denied   Family History:  Family History  Problem Relation Age of Onset  . Diabetes Daughter   . Breast cancer Neg Hx     Social History:   Social History   Social History  . Marital status: Married    Spouse name: N/A  . Number of children: N/A  . Years of education: N/A   Social History Main Topics  . Smoking status: Never Smoker  . Smokeless tobacco: Never Used  . Alcohol use No  . Drug use: No  . Sexual activity: No   Other Topics  Concern  . None   Social History Narrative  . None    Additional Social History:  Patient is currently widowed. She is living with her daughter.  Allergies:   Allergies  Allergen Reactions  . Erythromycin Other (See Comments)  . Oxycodone-Acetaminophen Nausea And Vomiting  . Oxycodone-Acetaminophen Nausea And Vomiting  . Tapentadol Other (See Comments)  . Amoxicillin Nausea Only  . Ezetimibe     Other reaction(s): Unknown  . Gabapentin Other (See Comments)    dizzy  . Glucosamine-Chondroitin     Other reaction(s): Unknown  . Lovastatin Other (See Comments)  . Omeprazole Nausea Only  . Azithromycin Nausea Only  . Erythromycin Base Rash  . Heparin Rash and Other (See Comments)  . Other Rash    Metabolic Disorder Labs: Lab Results  Component Value Date   HGBA1C 7.7 (H) 09/10/2016   MPG 174 09/10/2016   No results found for: PROLACTIN Lab Results  Component Value Date   CHOL 122 04/20/2013   TRIG 19 04/20/2013   HDL 66 (H) 04/20/2013   VLDL 4 (L) 04/20/2013   LDLCALC 52 04/20/2013   LDLCALC 57 12/13/2011     Current Medications: Current Outpatient Prescriptions  Medication Sig Dispense Refill  . ACCU-CHEK SOFTCLIX LANCETS lancets     . aspirin 81 MG tablet Take 81 mg by mouth daily. AM    . atorvastatin (LIPITOR) 10 MG tablet Take 10 mg by mouth daily. AM    . buPROPion (WELLBUTRIN) 75 MG tablet Take 1 tablet (75 mg total) by mouth every morning. 30 tablet 2  . Calcium Carb-Cholecalciferol (CALCIUM-VITAMIN D) 500-400 MG-UNIT TABS Take 1 tablet by mouth daily.     Marland Kitchen escitalopram (LEXAPRO) 20 MG tablet Take 1 tablet (20 mg total) by mouth daily. AM 90 tablet 2  . lisinopril-hydrochlorothiazide (PRINZIDE,ZESTORETIC) 20-12.5 MG tablet Take 1 tablet by mouth daily.    . memantine (NAMENDA) 10 MG tablet Take 1 tablet (10 mg total) by mouth daily. PM 90 tablet 3  . metoCLOPramide (REGLAN) 10 MG tablet Take 1 tablet (10 mg total) by mouth every 6 (six) hours as needed  for nausea. 6 tablet 0  . Multiple Vitamin (THERA) TABS Take 1 tablet by mouth daily.     . Multiple Vitamins-Minerals (VISION-VITE PRESERVE PO) Take by mouth.    . saxagliptin HCl (ONGLYZA) 2.5 MG TABS tablet Take 2.5 mg by mouth daily.    . vitamin B-12 (CYANOCOBALAMIN) 500 MCG tablet Take 500 mcg by mouth daily. AM     No current facility-administered medications for this visit.     Neurologic: Headache: No Seizure: No Paresthesias:No  Musculoskeletal: Strength & Muscle Tone: within normal limits Gait & Station: normal Patient leans: N/A  Psychiatric Specialty Exam: Review of Systems  Musculoskeletal: Positive for back pain and neck pain.  Neurological: Positive for tingling.  Endo/Heme/Allergies: Positive for environmental allergies.  Psychiatric/Behavioral: Positive  for depression. The patient is nervous/anxious.   All other systems reviewed and are negative.   Blood pressure (!) 147/83, pulse 77, temperature 98.1 F (36.7 C), temperature source Oral, weight 126 lb 9.6 oz (57.4 kg).Body mass index is 19.25 kg/m.  General Appearance: Fairly Groomed  Eye Contact:  Fair  Speech:  Clear and Coherent and Normal Rate  Volume:  Normal  Mood:  Euthymic  Affect:  Appropriate and Congruent  Thought Process:  Coherent and Goal Directed  Orientation:  Full (Time, Place, and Person)  Thought Content:  WDL  Suicidal Thoughts:  No  Homicidal Thoughts:  No  Memory:  Immediate;   Fair  Judgement:  Fair  Insight:  Fair  Psychomotor Activity:  Normal  Concentration:  Fair  Recall:  [employer] of Knowledge:Fair  Language: Fair  Akathisia:  No  Handed:  Right  AIMS (if indicated):    Assets:  Communication Skills Desire for Improvement Physical Health Social Support  ADL's:  Intact  Cognition: WNL  Sleep:      Treatment Plan Summary: Medication management    Discussed with patient about her medications treatment risk benefits and alternatives. Continue  Wellbutrin 75  mg in the morning   Lexapro 20 mg daily She will continue on Namenda 10 mg daily. Patient was given 3 months supply of the medications.  Follow-up in 2 months or earlier depending on her symptoms.     More than 50% of the time spent in psychoeducation, counseling and coordination of care.    This note was generated in part or whole with voice recognition software. Voice regonition is usually quite accurate but there are transcription errors that can and very often do occur. I apologize for any typographical errors that were not detected and corrected.    Rainey Pines, MD 10/8/201811:42 AM

## 2017-08-23 ENCOUNTER — Ambulatory Visit: Payer: Medicare Other | Admitting: Psychiatry

## 2017-08-23 ENCOUNTER — Other Ambulatory Visit: Payer: Self-pay

## 2017-08-23 ENCOUNTER — Encounter: Payer: Self-pay | Admitting: Psychiatry

## 2017-08-23 VITALS — BP 126/80 | HR 87 | Temp 98.7°F | Wt 124.2 lb

## 2017-08-23 DIAGNOSIS — R4189 Other symptoms and signs involving cognitive functions and awareness: Secondary | ICD-10-CM | POA: Diagnosis not present

## 2017-08-23 DIAGNOSIS — F331 Major depressive disorder, recurrent, moderate: Secondary | ICD-10-CM | POA: Diagnosis not present

## 2017-08-23 DIAGNOSIS — R4689 Other symptoms and signs involving appearance and behavior: Secondary | ICD-10-CM | POA: Diagnosis not present

## 2017-08-23 MED ORDER — MEMANTINE HCL 10 MG PO TABS: 10.0000 mg | ORAL_TABLET | Freq: Every day | ORAL | 3 refills | Status: DC

## 2017-08-23 MED ORDER — ESCITALOPRAM OXALATE 20 MG PO TABS: 20.0000 mg | ORAL_TABLET | Freq: Every day | ORAL | 2 refills | Status: DC

## 2017-08-23 MED ORDER — BUPROPION HCL 75 MG PO TABS: 75.0000 mg | ORAL_TABLET | Freq: Every morning | ORAL | 2 refills | Status: DC

## 2017-08-23 NOTE — Progress Notes (Signed)
Psychiatric MD Progress Note  Patient Identification: Lori Gilmore MRN:  [id number] Date of Evaluation:  08/23/2017 Referral Source: [facility]  Chief Complaint:   Chief Complaint    Follow-up; Medication Refill     Visit Diagnosis:    ICD-10-CM   1. MDD (major depressive disorder), recurrent episode, moderate (HCC) F33.1   2. Cognitive and behavioral changes R41.89    R46.89     History of Present Illness:   Patient is a 83 year-old widowed female who presented for follow-up. She reported that she has been living with her daughter and has been spending time with her grandchildren. She reported that she enjoyed her Thanksgiving. She stated that she feels good and has been compliant with her medication. She appeared calm and alert during the interview. Patient reported that she is planning for the holidays. She is always thankful to God for her life and has been happy. She reported that she does not have any symptoms at this time and the current combination of medications has been helping her. She has been sleeping well at night. She reported that she fell 2 months ago but is doing well and did not seek any help. She denied having any perceptual disturbances. She denied having any suicidal homicidal ideations or plans.  She appeared pleasant and cooperative during the interview.    Associated Signs/Symptoms: Depression Symptoms:  difficulty concentrating, impaired memory, anxiety, (Hypo) Manic Symptoms:  none Anxiety Symptoms:  Excessive Worry, Psychotic Symptoms:  none PTSD Symptoms: Negative NA  Past Psychiatric History:   Patient has history of depression and has been following with Dr. Bridgett Larsson and [facility]. She reported that she has history of multiple psychiatric hospitalizations in the past when her daughter passed away at the age of 63.  Previous Psychotropic Medications: She does not remember the name of her previous  medications.  Substance Abuse History in the last 12 months:  No.  Consequences of Substance Abuse: Negative NA  Past Medical History:  Past Medical History:  Diagnosis Date  . Anemia   . Arthritis    right knee  . Blood clots in brain   . Depression   . Diabetes mellitus without complication ([place])    Diet controlled  . Headache    sinus  . History of hiatal hernia   . Hypercholesteremia   . Hypertension   . Renal cancer ([place])    right kidney  . Seasonal allergies   . TIA (transient ischemic attack) 2000   no deficits  . Wears dentures    full upper and lower    Past Surgical History:  Procedure Laterality Date  . ABDOMINAL HYSTERECTOMY    . APPENDECTOMY    . BRAIN SURGERY     for blood clot  . BREAST BIOPSY Left 03/21/13   u/s bx-neg  . BREAST BIOPSY Left    neg  . BREAST CYST ASPIRATION Left    neg  . CATARACT EXTRACTION W/PHACO Right 02/13/2015   Procedure: CATARACT EXTRACTION PHACO AND INTRAOCULAR LENS PLACEMENT (IOC);  Surgeon: Leandrew Koyanagi, MD;  Location: [place];  Service: Ophthalmology;  Laterality: Right;  DIABETIC  . CATARACT EXTRACTION W/PHACO Left 03/20/2015   Procedure: CATARACT EXTRACTION PHACO AND INTRAOCULAR LENS PLACEMENT (IOC);  Surgeon: Leandrew Koyanagi, MD;  Location: [place];  Service: Ophthalmology;  Laterality: Left;  . CHOLECYSTECTOMY      Family Psychiatric History:  Pt denied   Family History:  Family History  Problem Relation Age  of Onset  . Diabetes Daughter   . Breast cancer Neg Hx     Social History:   Social History   Socioeconomic History  . Marital status: Widowed    Spouse name: None  . Number of children: 4  . Years of education: None  . Highest education level: 9th grade  Social Needs  . Financial resource strain: Not very hard  . Food insecurity - worry: Never true  . Food insecurity - inability: Never true  . Transportation needs - medical: No  . Transportation needs -  non-medical: No  Occupational History    Comment: retired  Tobacco Use  . Smoking status: Never Smoker  . Smokeless tobacco: Never Used  Substance and Sexual Activity  . Alcohol use: No  . Drug use: No  . Sexual activity: No  Other Topics Concern  . None  Social History Narrative  . None    Additional Social History:  Patient is currently widowed. She is living with her daughter.  Allergies:   Allergies  Allergen Reactions  . Erythromycin Other (See Comments)  . Oxycodone-Acetaminophen Nausea And Vomiting  . Oxycodone-Acetaminophen Nausea And Vomiting  . Tapentadol Other (See Comments)  . Amoxicillin Nausea Only  . Ezetimibe     Other reaction(s): Unknown  . Gabapentin Other (See Comments)    dizzy  . Glucosamine-Chondroitin     Other reaction(s): Unknown  . Lovastatin Other (See Comments)  . Omeprazole Nausea Only  . Azithromycin Nausea Only  . Erythromycin Base Rash  . Heparin Rash and Other (See Comments)  . Other Rash    Metabolic Disorder Labs: Lab Results  Component Value Date   HGBA1C 7.7 (H) 09/10/2016   MPG 174 09/10/2016   No results found for: PROLACTIN Lab Results  Component Value Date   CHOL 122 04/20/2013   TRIG 19 04/20/2013   HDL 66 (H) 04/20/2013   VLDL 4 (L) 04/20/2013   LDLCALC 52 04/20/2013   LDLCALC 57 12/13/2011     Current Medications: Current Outpatient Medications  Medication Sig Dispense Refill  . ACCU-CHEK SOFTCLIX LANCETS lancets     . aspirin 81 MG tablet Take 81 mg by mouth daily. AM    . atorvastatin (LIPITOR) 10 MG tablet Take 10 mg by mouth daily. AM    . buPROPion (WELLBUTRIN) 75 MG tablet Take 1 tablet (75 mg total) by mouth every morning. 30 tablet 2  . Calcium Carb-Cholecalciferol (CALCIUM-VITAMIN D) 500-400 MG-UNIT TABS Take 1 tablet by mouth daily.     Marland Kitchen escitalopram (LEXAPRO) 20 MG tablet Take 1 tablet (20 mg total) by mouth daily. AM 90 tablet 2  . lisinopril-hydrochlorothiazide (PRINZIDE,ZESTORETIC) 20-12.5  MG tablet Take 1 tablet by mouth daily.    . memantine (NAMENDA) 10 MG tablet Take 1 tablet (10 mg total) by mouth daily. PM 90 tablet 3  . metoCLOPramide (REGLAN) 10 MG tablet Take 1 tablet (10 mg total) by mouth every 6 (six) hours as needed for nausea. 6 tablet 0  . Multiple Vitamin (THERA) TABS Take 1 tablet by mouth daily.     . Multiple Vitamins-Minerals (VISION-VITE PRESERVE PO) Take by mouth.    . saxagliptin HCl (ONGLYZA) 2.5 MG TABS tablet Take 2.5 mg by mouth daily.    . vitamin B-12 (CYANOCOBALAMIN) 500 MCG tablet Take 500 mcg by mouth daily. AM     No current facility-administered medications for this visit.     Neurologic: Headache: No Seizure: No Paresthesias:No  Musculoskeletal: Strength & Muscle  Tone: within normal limits Gait & Station: normal Patient leans: N/A  Psychiatric Specialty Exam: Review of Systems  Musculoskeletal: Positive for back pain and neck pain.  Neurological: Positive for tingling.  Endo/Heme/Allergies: Positive for environmental allergies.  Psychiatric/Behavioral: Positive for depression. The patient is nervous/anxious.   All other systems reviewed and are negative.   Blood pressure 126/80, pulse 87, temperature 98.7 F (37.1 C), temperature source Oral, weight 124 lb 3.2 oz (56.3 kg).Body mass index is 18.88 kg/m.  General Appearance: Fairly Groomed  Eye Contact:  Fair  Speech:  Clear and Coherent and Normal Rate  Volume:  Normal  Mood:  Euthymic  Affect:  Appropriate and Congruent  Thought Process:  Coherent and Goal Directed  Orientation:  Full (Time, Place, and Person)  Thought Content:  WDL  Suicidal Thoughts:  No  Homicidal Thoughts:  No  Memory:  Immediate;   Fair  Judgement:  Fair  Insight:  Fair  Psychomotor Activity:  Normal  Concentration:  Fair  Recall:  [employer] of Knowledge:Fair  Language: Fair  Akathisia:  No  Handed:  Right  AIMS (if indicated):    Assets:  Communication Skills Desire for  Improvement Physical Health Social Support  ADL's:  Intact  Cognition: WNL  Sleep:      Treatment Plan Summary: Medication management   Continue meds as follows Discussed with patient about her medications treatment risk benefits and alternatives. Continue  Wellbutrin 75 mg in the morning   Lexapro 20 mg daily She will continue on Namenda 10 mg daily. Patient was given 3 months supply of the medications.  Follow-up in 2 months or earlier depending on her symptoms.     More than 50% of the time spent in psychoeducation, counseling and coordination of care.    This note was generated in part or whole with voice recognition software. Voice regonition is usually quite accurate but there are transcription errors that can and very often do occur. I apologize for any typographical errors that were not detected and corrected.    Rainey Pines, MD 12/3/20181:39 PM

## 2017-09-23 ENCOUNTER — Other Ambulatory Visit: Payer: Self-pay

## 2017-09-23 ENCOUNTER — Encounter: Payer: Self-pay | Admitting: Gynecology

## 2017-09-23 ENCOUNTER — Ambulatory Visit
Admission: EM | Admit: 2017-09-23 | Discharge: 2017-09-23 | Disposition: A | Discharge status: Home / Self Care | Payer: Medicare Other | Attending: Family Medicine | Admitting: Family Medicine

## 2017-09-23 DIAGNOSIS — J069 Acute upper respiratory infection, unspecified: Secondary | ICD-10-CM | POA: Diagnosis not present

## 2017-09-23 DIAGNOSIS — R05 Cough: Secondary | ICD-10-CM

## 2017-09-23 MED ORDER — DOXYCYCLINE HYCLATE 100 MG PO CAPS: 100.0000 mg | ORAL_CAPSULE | Freq: Two times a day (BID) | ORAL | 0 refills | Status: DC

## 2017-09-23 MED ORDER — ALBUTEROL SULFATE HFA 108 (90 BASE) MCG/ACT IN AERS: 1.0000 | INHALATION_SPRAY | Freq: Four times a day (QID) | RESPIRATORY_TRACT | 0 refills | Status: DC | PRN

## 2017-09-23 MED ORDER — BENZONATATE 200 MG PO CAPS: ORAL_CAPSULE | ORAL | 0 refills | Status: DC

## 2017-09-23 NOTE — ED Triage Notes (Signed)
Patient c/o cough x 1 week. Per patient yellowish  mucous when she cough.

## 2017-09-23 NOTE — ED Provider Notes (Signed)
[facility]    CSN: [id number] Arrival date & time: 09/23/17  1218     History   Chief Complaint Chief Complaint  Patient presents with  . Cough    HPI Lori Gilmore is a 84 y.o. female.   HPI  84 year old female presents with a one-week history of a productive cough.  He has been bringing up yellowish mucus she had many years of secondhand smoke contact with her husband who died of COPD.  Been feverish or have chills.  Had sinus congestion and drainage.         Past Medical History:  Diagnosis Date  . Anemia   . Arthritis    right knee  . Blood clots in brain   . Depression   . Diabetes mellitus without complication ([place])    Diet controlled  . Headache    sinus  . History of hiatal hernia   . Hypercholesteremia   . Hypertension   . Renal cancer ([place])    right kidney  . Seasonal allergies   . TIA (transient ischemic attack) 2000   no deficits  . Wears dentures    full upper and lower    Patient Active Problem List   Diagnosis Date Noted  . Personal history of renal cell carcinoma 11/22/2016  . Incomplete immunization status 04/08/2016  . DDD (degenerative disc disease), cervical 04/02/2016  . Major depressive disorder, recurrent episode, in full remission ([place]) 01/02/2016  . Osteoporosis, post-menopausal 12/10/2015  . Breast lump 11/19/2015  . Other long term (current) drug therapy 05/03/2015  . High risk medication use 05/03/2015  . Mild cognitive disorder 10/31/2014  . Hemorrhage of vagina 06/15/2014  . Severe episode of recurrent major depressive disorder ([place]) 05/08/2014  . History of urinary anomaly 04/30/2014  . Personal history of urinary infection 04/30/2014  . History of recurrent UTIs 04/30/2014  . Anemia in chronic illness 04/29/2014  . Absolute anemia 04/29/2014  . Benign essential HTN 04/29/2014  . Chronic kidney disease (CKD), stage III (moderate) ([place]) 02/06/2014  . B12 deficiency 02/06/2014  . Anxiety and  depression 02/06/2014  . Hypercholesterolemia 02/06/2014  . Mixed anxiety depressive disorder 02/06/2014  . Fall 02/21/2013  . Malaise and fatigue 02/21/2013  . Adynamia 02/21/2013  . Bladder pain 02/20/2013  . Difficult or painful urination 02/20/2013  . Frank hematuria 02/20/2013  . Labial cyst 02/20/2013  . Malignant neoplasm of kidney ([place]) 02/20/2013  . Urge incontinence of urine 02/20/2013  . Renal cell carcinoma ([place]) 02/20/2013  . Diabetes mellitus ([place]) 06/06/2010  . Benign hypertension 06/06/2010  . Polyneuropathy in diseases classified elsewhere ([place]) 06/06/2010  . Neuropathy 06/06/2010  . Mononeuritis 06/06/2010    Past Surgical History:  Procedure Laterality Date  . ABDOMINAL HYSTERECTOMY    . APPENDECTOMY    . BRAIN SURGERY     for blood clot  . BREAST BIOPSY Left 03/21/13   u/s bx-neg  . BREAST BIOPSY Left    neg  . BREAST CYST ASPIRATION Left    neg  . CATARACT EXTRACTION W/PHACO Right 02/13/2015   Procedure: CATARACT EXTRACTION PHACO AND INTRAOCULAR LENS PLACEMENT (IOC);  Surgeon: Leandrew Koyanagi, MD;  Location: [place];  Service: Ophthalmology;  Laterality: Right;  DIABETIC  . CATARACT EXTRACTION W/PHACO Left 03/20/2015   Procedure: CATARACT EXTRACTION PHACO AND INTRAOCULAR LENS PLACEMENT (IOC);  Surgeon: Leandrew Koyanagi, MD;  Location: [place];  Service: Ophthalmology;  Laterality: Left;  . CHOLECYSTECTOMY      OB History  No data available       Home Medications    Prior to Admission medications   Medication Sig Start Date End Date Taking? Authorizing Provider  ACCU-CHEK SOFTCLIX LANCETS lancets  03/15/15  Yes [provider]  aspirin 81 MG tablet Take 81 mg by mouth daily. AM   Yes [provider]  atorvastatin (LIPITOR) 10 MG tablet Take 10 mg by mouth daily. AM   Yes [provider]  buPROPion (WELLBUTRIN) 75 MG tablet Take 1 tablet (75 mg total) by mouth every morning. 08/23/17  Yes  Rainey Pines, MD  Calcium Carb-Cholecalciferol (CALCIUM-VITAMIN D) 500-400 MG-UNIT TABS Take 1 tablet by mouth daily.  08/22/09  Yes [provider]  escitalopram (LEXAPRO) 20 MG tablet Take 1 tablet (20 mg total) by mouth daily. AM 08/23/17  Yes Rainey Pines, MD  lisinopril-hydrochlorothiazide (PRINZIDE,ZESTORETIC) 20-12.5 MG tablet Take 1 tablet by mouth daily.   Yes [provider]  memantine (NAMENDA) 10 MG tablet Take 1 tablet (10 mg total) by mouth daily. PM 08/23/17  Yes Rainey Pines, MD  metoCLOPramide (REGLAN) 10 MG tablet Take 1 tablet (10 mg total) by mouth every 6 (six) hours as needed for nausea. 01/08/17 01/08/18 Yes Merlyn Lot, MD  Multiple Vitamin (THERA) TABS Take 1 tablet by mouth daily.  05/11/14  Yes [provider]  Multiple Vitamins-Minerals (VISION-VITE PRESERVE PO) Take by mouth.   Yes [provider]  saxagliptin HCl (ONGLYZA) 2.5 MG TABS tablet Take 2.5 mg by mouth daily.   Yes [provider]  vitamin B-12 (CYANOCOBALAMIN) 500 MCG tablet Take 500 mcg by mouth daily. AM   Yes [provider]  albuterol (PROVENTIL HFA;VENTOLIN HFA) 108 (90 Base) MCG/ACT inhaler Inhale 1-2 puffs into the lungs every 6 (six) hours as needed for wheezing or shortness of breath. Use with spacer 09/23/17   Lorin Picket, PA-C  benzonatate (TESSALON) 200 MG capsule Take one cap TID PRN cough 09/23/17   Lorin Picket, PA-C  doxycycline (VIBRAMYCIN) 100 MG capsule Take 1 capsule (100 mg total) by mouth 2 (two) times daily. 09/23/17   Lorin Picket, PA-C    Family History Family History  Problem Relation Age of Onset  . Diabetes Daughter   . Breast cancer Neg Hx     Social History Social History   Tobacco Use  . Smoking status: Never Smoker  . Smokeless tobacco: Never Used  Substance Use Topics  . Alcohol use: No  . Drug use: No     Allergies   Erythromycin; Oxycodone-acetaminophen; Oxycodone-acetaminophen; Tapentadol;  Amoxicillin; Ezetimibe; Gabapentin; Glucosamine-chondroitin; Lovastatin; Omeprazole; Azithromycin; Erythromycin base; Heparin; and Other   Review of Systems Review of Systems  Constitutional: Positive for activity change and fatigue. Negative for chills and fever.  HENT: Positive for congestion, postnasal drip and sinus pressure.   Respiratory: Positive for cough and wheezing.   All other systems reviewed and are negative.    Physical Exam Triage Vital Signs ED Triage Vitals  Enc Vitals Group     BP 09/23/17 1310 114/68     Pulse Rate 09/23/17 1310 75     Resp --      Temp 09/23/17 1310 98.2 F (36.8 C)     Temp Source 09/23/17 1310 Oral     SpO2 09/23/17 1310 99 %     Weight 09/23/17 1311 128 lb (58.1 kg)     Height 09/23/17 1311 5\' 8"  (1.727 m)     Head Circumference --  Peak Flow --      Pain Score --      Pain Loc --      Pain Edu? --      Excl. in [place]? --    No data found.  Updated Vital Signs BP 114/68 (BP Location: Left Arm)   Pulse 75   Temp 98.2 F (36.8 C) (Oral)   Ht 5\' 8"  (1.727 m)   Wt 128 lb (58.1 kg)   SpO2 99%   BMI 19.46 kg/m   Visual Acuity Right Eye Distance:   Left Eye Distance:   Bilateral Distance:    Right Eye Near:   Left Eye Near:    Bilateral Near:     Physical Exam  Constitutional: She is oriented to person, place, and time. She appears well-developed and well-nourished. No distress.  HENT:  Head: Normocephalic.  Right Ear: External ear normal.  Left Ear: External ear normal.  Nose: Nose normal.  Mouth/Throat: Oropharynx is clear and moist. No oropharyngeal exudate.  Eyes: Pupils are equal, round, and reactive to light. Right eye exhibits no discharge. Left eye exhibits no discharge.  Neck: Normal range of motion.  Pulmonary/Chest: Effort normal and breath sounds normal.  Musculoskeletal: Normal range of motion.  Lymphadenopathy:    She has no cervical adenopathy.  Neurological: She is alert and oriented to person,  place, and time.  Skin: Skin is warm and dry. She is not diaphoretic.  Psychiatric: She has a normal mood and affect. Her behavior is normal. Judgment and thought content normal.  Nursing note and vitals reviewed.    UC Treatments / Results  Labs (all labs ordered are listed, but only abnormal results are displayed) Labs Reviewed - No data to display  EKG  EKG Interpretation None       Radiology No results found.  Procedures Procedures (including critical care time)  Medications Ordered in UC Medications - No data to display   Initial Impression / Assessment and Plan / UC Course  I have reviewed the triage vital signs and the nursing notes.  Pertinent labs & imaging results that were available during my care of the patient were reviewed by me and considered in my medical decision making (see chart for details).     Plan: 1. Test/x-ray results and diagnosis reviewed with patient 2. rx as per orders; risks, benefits, potential side effects reviewed with patient 3. Recommend supportive treatment with rest and fluids.  Albuterol inhaler for congestion and shortness of breath or wheezing.  Because of the long history of secondhand smoke contact with her husband who eventually died of COPD emphysema I believe that she may have an element of COPD.  Therefore I will place her on doxycycline for 7 days.  If she is not improving or worsening she should go to the emergency room or to her primary care physician for further evaluation 4. F/u prn if symptoms worsen or don't improve   Final Clinical Impressions(s) / UC Diagnoses   Final diagnoses:  Upper respiratory tract infection, unspecified type    ED Discharge Orders        Ordered    benzonatate (TESSALON) 200 MG capsule     09/23/17 1331    doxycycline (VIBRAMYCIN) 100 MG capsule  2 times daily     09/23/17 1331    albuterol (PROVENTIL HFA;VENTOLIN HFA) 108 (90 Base) MCG/ACT inhaler  Every 6 hours PRN    Comments:   Provide spacer and instructions to patient   09/23/17  1331       Controlled Substance Prescriptions [place] Controlled Substance Registry consulted? Not Applicable   Lorin Picket, PA-C 09/23/17 1337

## 2017-10-03 ENCOUNTER — Emergency Department: Payer: Medicare Other

## 2017-10-03 ENCOUNTER — Other Ambulatory Visit: Payer: Self-pay

## 2017-10-03 ENCOUNTER — Encounter: Payer: Self-pay | Admitting: Emergency Medicine

## 2017-10-03 ENCOUNTER — Emergency Department
Admission: EM | Admit: 2017-10-03 | Discharge: 2017-10-03 | Disposition: A | Discharge status: Home / Self Care | Payer: Medicare Other | Attending: Emergency Medicine | Admitting: Emergency Medicine

## 2017-10-03 DIAGNOSIS — N183 Chronic kidney disease, stage 3 (moderate): Secondary | ICD-10-CM | POA: Diagnosis not present

## 2017-10-03 DIAGNOSIS — Z7982 Long term (current) use of aspirin: Secondary | ICD-10-CM | POA: Diagnosis not present

## 2017-10-03 DIAGNOSIS — R531 Weakness: Secondary | ICD-10-CM | POA: Diagnosis present

## 2017-10-03 DIAGNOSIS — E1122 Type 2 diabetes mellitus with diabetic chronic kidney disease: Secondary | ICD-10-CM | POA: Insufficient documentation

## 2017-10-03 DIAGNOSIS — I129 Hypertensive chronic kidney disease with stage 1 through stage 4 chronic kidney disease, or unspecified chronic kidney disease: Secondary | ICD-10-CM | POA: Diagnosis not present

## 2017-10-03 DIAGNOSIS — Z79899 Other long term (current) drug therapy: Secondary | ICD-10-CM | POA: Insufficient documentation

## 2017-10-03 DIAGNOSIS — Z85528 Personal history of other malignant neoplasm of kidney: Secondary | ICD-10-CM | POA: Diagnosis not present

## 2017-10-03 DIAGNOSIS — R5383 Other fatigue: Secondary | ICD-10-CM | POA: Diagnosis not present

## 2017-10-03 LAB — URINALYSIS, COMPLETE (UACMP) WITH MICROSCOPIC
BACTERIA UA: NONE SEEN
BILIRUBIN URINE: NEGATIVE
Glucose, UA: NEGATIVE mg/dL
KETONES UR: NEGATIVE mg/dL
LEUKOCYTES UA: NEGATIVE
Nitrite: NEGATIVE
Protein, ur: NEGATIVE mg/dL
SPECIFIC GRAVITY, URINE: 1.008 (ref 1.005–1.030)
SQUAMOUS EPITHELIAL / LPF: NONE SEEN
pH: 7 (ref 5.0–8.0)

## 2017-10-03 LAB — COMPREHENSIVE METABOLIC PANEL
ALBUMIN: 3.6 g/dL (ref 3.5–5.0)
ALK PHOS: 66 U/L (ref 38–126)
ALT: 14 U/L (ref 14–54)
ANION GAP: 8 (ref 5–15)
AST: 26 U/L (ref 15–41)
BUN: 18 mg/dL (ref 6–20)
CO2: 27 mmol/L (ref 22–32)
Calcium: 8.8 mg/dL — ABNORMAL LOW (ref 8.9–10.3)
Chloride: 102 mmol/L (ref 101–111)
Creatinine, Ser: 0.93 mg/dL (ref 0.44–1.00)
GFR calc Af Amer: >60 mL/min (ref >60)
GFR calc non Af Amer: 56 mL/min — ABNORMAL LOW (ref >60)
GLUCOSE: 103 mg/dL — AB (ref 65–99)
POTASSIUM: 3.9 mmol/L (ref 3.5–5.1)
SODIUM: 137 mmol/L (ref 135–145)
Total Bilirubin: 0.6 mg/dL (ref 0.3–1.2)
Total Protein: 6.3 g/dL — ABNORMAL LOW (ref 6.5–8.1)

## 2017-10-03 LAB — CBC
HEMATOCRIT: 33.2 % — AB (ref 35.0–47.0)
Hemoglobin: 11 g/dL — ABNORMAL LOW (ref 12.0–16.0)
MCH: 31.1 pg (ref 26.0–34.0)
MCHC: 33.1 g/dL (ref 32.0–36.0)
MCV: 93.9 fL (ref 80.0–100.0)
Platelets: 359 10*3/uL (ref 150–440)
RBC: 3.54 MIL/uL — ABNORMAL LOW (ref 3.80–5.20)
RDW: 13.3 % (ref 11.5–14.5)
WBC: 5.9 10*3/uL (ref 3.6–11.0)

## 2017-10-03 LAB — TROPONIN I: Troponin I: 0.03 ng/mL (ref <0.03)

## 2017-10-03 NOTE — ED Triage Notes (Signed)
Pt presents to ED via POV c/o generalized weakness. Currently on cefdinir for bronchitis. No focal weakness noted or reported. No slurred speech or facial droop.

## 2017-10-03 NOTE — ED Provider Notes (Signed)
[facility] Emergency Department Provider Note  ____________________________________________  Time seen: Approximately 10:04 PM  I have reviewed the triage vital signs and the nursing notes.   HISTORY  Chief Complaint Weakness    HPI Lori Gilmore is a 84 y.o. female who complains of generalized weakness for the past few days. Been on doxycycline for the past 10 days for bronchitis, along with Tessalon and albuterol. States her last dose of the antibiotic is tomorrow. Denies chest pain shortness of breath or cough. No fevers chills or sweats. She does feel cold all the time but this is a chronic issue that is unchanged for her. No dizziness or syncope. She is eating and drinking normally. No abdominal pain vomiting or diarrhea. No radiating.. No aggravating or alleviating factors for her generalized weakness. It's constant. Overall rates severity as mild but "better to be safe than sorry".     Past Medical History:  Diagnosis Date  . Anemia   . Arthritis    right knee  . Blood clots in brain   . Depression   . Diabetes mellitus without complication ([place])    Diet controlled  . Headache    sinus  . History of hiatal hernia   . Hypercholesteremia   . Hypertension   . Renal cancer ([place])    right kidney  . Seasonal allergies   . TIA (transient ischemic attack) 2000   no deficits  . Wears dentures    full upper and lower     Patient Active Problem List   Diagnosis Date Noted  . Personal history of renal cell carcinoma 11/22/2016  . Incomplete immunization status 04/08/2016  . DDD (degenerative disc disease), cervical 04/02/2016  . Major depressive disorder, recurrent episode, in full remission ([place]) 01/02/2016  . Osteoporosis, post-menopausal 12/10/2015  . Breast lump 11/19/2015  . Other long term (current) drug therapy 05/03/2015  . High risk medication use 05/03/2015  . Mild cognitive disorder 10/31/2014  . Hemorrhage of vagina 06/15/2014   . Severe episode of recurrent major depressive disorder ([place]) 05/08/2014  . History of urinary anomaly 04/30/2014  . Personal history of urinary infection 04/30/2014  . History of recurrent UTIs 04/30/2014  . Anemia in chronic illness 04/29/2014  . Absolute anemia 04/29/2014  . Benign essential HTN 04/29/2014  . Chronic kidney disease (CKD), stage III (moderate) ([place]) 02/06/2014  . B12 deficiency 02/06/2014  . Anxiety and depression 02/06/2014  . Hypercholesterolemia 02/06/2014  . Mixed anxiety depressive disorder 02/06/2014  . Fall 02/21/2013  . Malaise and fatigue 02/21/2013  . Adynamia 02/21/2013  . Bladder pain 02/20/2013  . Difficult or painful urination 02/20/2013  . Frank hematuria 02/20/2013  . Labial cyst 02/20/2013  . Malignant neoplasm of kidney ([place]) 02/20/2013  . Urge incontinence of urine 02/20/2013  . Renal cell carcinoma ([place]) 02/20/2013  . Diabetes mellitus ([place]) 06/06/2010  . Benign hypertension 06/06/2010  . Polyneuropathy in diseases classified elsewhere ([place]) 06/06/2010  . Neuropathy 06/06/2010  . Mononeuritis 06/06/2010     Past Surgical History:  Procedure Laterality Date  . ABDOMINAL HYSTERECTOMY    . APPENDECTOMY    . BRAIN SURGERY     for blood clot  . BREAST BIOPSY Left 03/21/13   u/s bx-neg  . BREAST BIOPSY Left    neg  . BREAST CYST ASPIRATION Left    neg  . CATARACT EXTRACTION W/PHACO Right 02/13/2015   Procedure: CATARACT EXTRACTION PHACO AND INTRAOCULAR LENS PLACEMENT (IOC);  Surgeon: Leandrew Koyanagi, MD;  Location: [place];  Service: Ophthalmology;  Laterality: Right;  DIABETIC  . CATARACT EXTRACTION W/PHACO Left 03/20/2015   Procedure: CATARACT EXTRACTION PHACO AND INTRAOCULAR LENS PLACEMENT (IOC);  Surgeon: Leandrew Koyanagi, MD;  Location: [place];  Service: Ophthalmology;  Laterality: Left;  . CHOLECYSTECTOMY       Prior to Admission medications   Medication Sig Start Date End Date Taking? Authorizing  Provider  ACCU-CHEK SOFTCLIX LANCETS lancets  03/15/15   [provider]  albuterol (PROVENTIL HFA;VENTOLIN HFA) 108 (90 Base) MCG/ACT inhaler Inhale 1-2 puffs into the lungs every 6 (six) hours as needed for wheezing or shortness of breath. Use with spacer 09/23/17   Lorin Picket, PA-C  aspirin 81 MG tablet Take 81 mg by mouth daily. AM    [provider]  atorvastatin (LIPITOR) 10 MG tablet Take 10 mg by mouth daily. AM    [provider]  benzonatate (TESSALON) 200 MG capsule Take one cap TID PRN cough 09/23/17   Lorin Picket, PA-C  buPROPion (WELLBUTRIN) 75 MG tablet Take 1 tablet (75 mg total) by mouth every morning. 08/23/17   Rainey Pines, MD  Calcium Carb-Cholecalciferol (CALCIUM-VITAMIN D) 500-400 MG-UNIT TABS Take 1 tablet by mouth daily.  08/22/09   [provider]  doxycycline (VIBRAMYCIN) 100 MG capsule Take 1 capsule (100 mg total) by mouth 2 (two) times daily. 09/23/17   Lorin Picket, PA-C  escitalopram (LEXAPRO) 20 MG tablet Take 1 tablet (20 mg total) by mouth daily. AM 08/23/17   Rainey Pines, MD  lisinopril-hydrochlorothiazide (PRINZIDE,ZESTORETIC) 20-12.5 MG tablet Take 1 tablet by mouth daily.    [provider]  memantine (NAMENDA) 10 MG tablet Take 1 tablet (10 mg total) by mouth daily. PM 08/23/17   Rainey Pines, MD  metoCLOPramide (REGLAN) 10 MG tablet Take 1 tablet (10 mg total) by mouth every 6 (six) hours as needed for nausea. 01/08/17 01/08/18  Merlyn Lot, MD  Multiple Vitamin (THERA) TABS Take 1 tablet by mouth daily.  05/11/14   [provider]  Multiple Vitamins-Minerals (VISION-VITE PRESERVE PO) Take by mouth.    [provider]  saxagliptin HCl (ONGLYZA) 2.5 MG TABS tablet Take 2.5 mg by mouth daily.    [provider]  vitamin B-12 (CYANOCOBALAMIN) 500 MCG tablet Take 500 mcg by mouth daily. AM    [provider]     Allergies Erythromycin; Oxycodone-acetaminophen;  Oxycodone-acetaminophen; Tapentadol; Amoxicillin; Ezetimibe; Gabapentin; Glucosamine-chondroitin; Lovastatin; Omeprazole; Azithromycin; Erythromycin base; Heparin; and Other   Family History  Problem Relation Age of Onset  . Diabetes Daughter   . Breast cancer Neg Hx     Social History Social History   Tobacco Use  . Smoking status: Never Smoker  . Smokeless tobacco: Never Used  Substance Use Topics  . Alcohol use: No  . Drug use: No    Review of Systems  Constitutional:   No fever or chills.  ENT:   No sore throat. No rhinorrhea. Cardiovascular:   No chest pain or syncope. Respiratory:   No dyspnea or cough. Gastrointestinal:   Negative for abdominal pain, vomiting and diarrhea.  Musculoskeletal:   Negative for focal pain or swelling All other systems reviewed and are negative except as documented above in ROS and HPI.  ____________________________________________   PHYSICAL EXAM:  VITAL SIGNS: ED Triage Vitals  Enc Vitals Group     BP 10/03/17 2023 (!) 148/77     Pulse Rate 10/03/17 2023 68  Resp --      Temp 10/03/17 2023 98.4 F (36.9 C)     Temp Source 10/03/17 2023 Oral     SpO2 10/03/17 2023 100 %     Weight 10/03/17 2024 128 lb (58.1 kg)     Height 10/03/17 2024 5\' 8"  (1.727 m)     Head Circumference --      Peak Flow --      Pain Score 10/03/17 2031 6     Pain Loc --      Pain Edu? --      Excl. in [place]? --     Vital signs reviewed, nursing assessments reviewed.   Constitutional:   Alert and oriented. Well appearing and in no distress. Eyes:   No scleral icterus.  EOMI. No nystagmus. No conjunctival pallor. PERRL. ENT   Head:   Normocephalic and atraumatic.   Nose:   No congestion/rhinnorhea.    Mouth/Throat:   MMM, no pharyngeal erythema. No peritonsillar mass.    Neck:   No meningismus. Full ROM. Hematological/Lymphatic/Immunilogical:   No cervical lymphadenopathy. Cardiovascular:   RRR. Symmetric bilateral radial and DP  pulses.  No murmurs.  Respiratory:   Normal respiratory effort without tachypnea/retractions. Breath sounds are clear and equal bilaterally. No wheezes/rales/rhonchi. No inducible wheezing or coughing with FEV1 maneuver Gastrointestinal:   Soft and nontender. Non distended. There is no CVA tenderness.  No rebound, rigidity, or guarding. Genitourinary:   deferred Musculoskeletal:   Normal range of motion in all extremities. No joint effusions.  No lower extremity tenderness.  No edema. Neurologic:   Normal speech and language.  Motor grossly intact. No gross focal neurologic deficits are appreciated.  Skin:    Skin is warm, dry and intact. No rash noted.  No petechiae, purpura, or bullae.  ____________________________________________    LABS (pertinent positives/negatives) (all labs ordered are listed, but only abnormal results are displayed) Labs Reviewed  CBC - Abnormal; Notable for the following components:      Result Value   RBC 3.54 (*)    Hemoglobin 11.0 (*)    HCT 33.2 (*)    All other components within normal limits  URINALYSIS, COMPLETE (UACMP) WITH MICROSCOPIC - Abnormal; Notable for the following components:   Color, Urine STRAW (*)    APPearance CLEAR (*)    Hgb urine dipstick SMALL (*)    All other components within normal limits  COMPREHENSIVE METABOLIC PANEL - Abnormal; Notable for the following components:   Glucose, Bld 103 (*)    Calcium 8.8 (*)    Total Protein 6.3 (*)    GFR calc non Af Amer 56 (*)    All other components within normal limits  TROPONIN I - Abnormal; Notable for the following components:   Troponin I 0.03 (*)    All other components within normal limits   ____________________________________________   EKG  Interpreted by me Normal sinus rhythm rate of 66, left axis, normal intervals. Poor R-wave progression in anterior precordial leads. Normal ST segments. Isolated T-wave inversion in aVL which is  nonspecific.  ____________________________________________    RADIOLOGY  Dg Chest 2 View  Result Date: 10/03/2017 CLINICAL DATA:  Generalized weakness. EXAM: CHEST  2 VIEW COMPARISON:  03/28/2016 FINDINGS: The cardiac silhouette, mediastinal and hilar contours are within normal limits and stable. Chronic emphysematous changes and pulmonary scarring but no acute overlying pulmonary process. Moderate-sized hiatal hernia. The bony thorax is intact. Remote posttraumatic changes involving the left shoulder. IMPRESSION: No acute cardiopulmonary findings.  Electronically Signed   By: Marijo Sanes M.D.   On: 10/03/2017 21:08    ____________________________________________   PROCEDURES Procedures  ____________________________________________    CLINICAL IMPRESSION / ASSESSMENT AND PLAN / ED COURSE  Pertinent labs & imaging results that were available during my care of the patient were reviewed by me and considered in my medical decision making (see chart for details).   No evidence of infection or sepsis. Low suspicion for ACS PE or pneumonia. Doubt stroke.  Clinical Course as of Oct 03 2202  Nancy Fetter Oct 03, 2017  2135 Labs unchanged from prior. Nad.   [PS]  2158 Pt p/w fatigue. No focal sx. Recently tx'd for bronchitis, now no sob, cp, or cough. Exam, labs all unremarkable and reassuring. No sx to suggest ACS, PE, or dissection, troponin not relevant to current presentation and not requiring further eval. Will DC to outpt f/u.  [PS]    Clinical Course User Index [PS] Carrie Mew, MD     ____________________________________________   FINAL CLINICAL IMPRESSION(S) / ED DIAGNOSES    Final diagnoses:  Fatigue, unspecified type       Portions of this note were generated with dragon dictation software. Dictation errors may occur despite best attempts at proofreading.    Carrie Mew, MD 10/03/17 2207

## 2017-10-07 ENCOUNTER — Ambulatory Visit: Payer: Medicare Other | Admitting: Psychiatry

## 2017-10-07 ENCOUNTER — Other Ambulatory Visit: Payer: Self-pay

## 2017-10-07 ENCOUNTER — Encounter: Payer: Self-pay | Admitting: Psychiatry

## 2017-10-07 ENCOUNTER — Telehealth: Payer: Self-pay

## 2017-10-07 VITALS — BP 149/71 | HR 78 | Temp 98.4°F | Wt 122.2 lb

## 2017-10-07 DIAGNOSIS — F331 Major depressive disorder, recurrent, moderate: Secondary | ICD-10-CM

## 2017-10-07 DIAGNOSIS — R4689 Other symptoms and signs involving appearance and behavior: Secondary | ICD-10-CM

## 2017-10-07 DIAGNOSIS — R4189 Other symptoms and signs involving cognitive functions and awareness: Secondary | ICD-10-CM

## 2017-10-07 MED ORDER — BUPROPION HCL 75 MG PO TABS: 75.0000 mg | ORAL_TABLET | Freq: Every morning | ORAL | 2 refills | Status: DC

## 2017-10-07 MED ORDER — ESCITALOPRAM OXALATE 20 MG PO TABS: 20.0000 mg | ORAL_TABLET | Freq: Every day | ORAL | 2 refills | Status: DC

## 2017-10-07 MED ORDER — MEMANTINE HCL 10 MG PO TABS: 10.0000 mg | ORAL_TABLET | Freq: Every day | ORAL | 3 refills | Status: DC

## 2017-10-07 NOTE — Progress Notes (Signed)
Psychiatric MD Progress Note  Patient Identification: Lori Gilmore MRN:  [id number] Date of Evaluation:  10/07/2017 Referral Source: [facility]  Chief Complaint:   Chief Complaint    Follow-up; Medication Refill; Depression; Stress     Visit Diagnosis:    ICD-10-CM   1. MDD (major depressive disorder), recurrent episode, moderate (HCC) F33.1   2. Cognitive and behavioral changes R41.89    R46.89     History of Present Illness:   Patient is a 84 year-old widowed female who presented for follow-up. She reported that she has been living with her daughter and has been feeling anxious as her daughter who is her POA does not allow her to go out. She reported that she wants to go to the senior center and to meet with other people. She reported that she had bronchitis last week and she was at home. She appeared anxious and apprehensive during the interview. She reported that she does not want to stay at home most of the time. She does not have any suicidal ideations or plans. I spoke with a therapist at the office and we decided to provide her with information about the senior center in the local area. She is therapist also tried to contact her daughter and left a message with her. Patient denied having any suicidal ideations or plans. She denied any history of abuse as well. She has been compliant with her medications. She does not have any other acute symptoms at this time.      She appeared pleasant and cooperative during the interview.    Associated Signs/Symptoms: Depression Symptoms:  difficulty concentrating, impaired memory, anxiety, (Hypo) Manic Symptoms:  none Anxiety Symptoms:  Excessive Worry, Psychotic Symptoms:  none PTSD Symptoms: Negative NA  Past Psychiatric History:   Patient has history of depression and has been following with Dr. Bridgett Larsson and [facility]. She reported that she has history of multiple psychiatric hospitalizations in the  past when her daughter passed away at the age of 12.  Previous Psychotropic Medications: She does not remember the name of her previous medications.  Substance Abuse History in the last 12 months:  No.  Consequences of Substance Abuse: Negative NA  Past Medical History:  Past Medical History:  Diagnosis Date  . Anemia   . Arthritis    right knee  . Blood clots in brain   . Depression   . Diabetes mellitus without complication ([place])    Diet controlled  . Headache    sinus  . History of hiatal hernia   . Hypercholesteremia   . Hypertension   . Renal cancer ([place])    right kidney  . Seasonal allergies   . TIA (transient ischemic attack) 2000   no deficits  . Wears dentures    full upper and lower    Past Surgical History:  Procedure Laterality Date  . ABDOMINAL HYSTERECTOMY    . APPENDECTOMY    . BRAIN SURGERY     for blood clot  . BREAST BIOPSY Left 03/21/13   u/s bx-neg  . BREAST BIOPSY Left    neg  . BREAST CYST ASPIRATION Left    neg  . CATARACT EXTRACTION W/PHACO Right 02/13/2015   Procedure: CATARACT EXTRACTION PHACO AND INTRAOCULAR LENS PLACEMENT (IOC);  Surgeon: Leandrew Koyanagi, MD;  Location: [place];  Service: Ophthalmology;  Laterality: Right;  DIABETIC  . CATARACT EXTRACTION W/PHACO Left 03/20/2015   Procedure: CATARACT EXTRACTION PHACO AND INTRAOCULAR LENS PLACEMENT (IOC);  Surgeon: Leandrew Koyanagi, MD;  Location: [place];  Service: Ophthalmology;  Laterality: Left;  . CHOLECYSTECTOMY      Family Psychiatric History:  Pt denied   Family History:  Family History  Problem Relation Age of Onset  . Diabetes Daughter   . Breast cancer Neg Hx     Social History:   Social History   Socioeconomic History  . Marital status: Widowed    Spouse name: None  . Number of children: 4  . Years of education: None  . Highest education level: 9th grade  Social Needs  . Financial resource strain: Not very hard  . Food insecurity  - worry: Never true  . Food insecurity - inability: Never true  . Transportation needs - medical: No  . Transportation needs - non-medical: No  Occupational History    Comment: retired  Tobacco Use  . Smoking status: Never Smoker  . Smokeless tobacco: Never Used  Substance and Sexual Activity  . Alcohol use: No  . Drug use: No  . Sexual activity: No  Other Topics Concern  . None  Social History Narrative  . None    Additional Social History:  Patient is currently widowed. She is living with her daughter.  Allergies:   Allergies  Allergen Reactions  . Erythromycin Other (See Comments)  . Oxycodone-Acetaminophen Nausea And Vomiting  . Oxycodone-Acetaminophen Nausea And Vomiting  . Tapentadol Other (See Comments)  . Amoxicillin Nausea Only  . Ezetimibe     Other reaction(s): Unknown  . Gabapentin Other (See Comments)    dizzy  . Glucosamine-Chondroitin     Other reaction(s): Unknown  . Lovastatin Other (See Comments)  . Omeprazole Nausea Only  . Azithromycin Nausea Only  . Erythromycin Base Rash  . Heparin Rash and Other (See Comments)  . Other Rash    Metabolic Disorder Labs: Lab Results  Component Value Date   HGBA1C 7.7 (H) 09/10/2016   MPG 174 09/10/2016   No results found for: PROLACTIN Lab Results  Component Value Date   CHOL 122 04/20/2013   TRIG 19 04/20/2013   HDL 66 (H) 04/20/2013   VLDL 4 (L) 04/20/2013   LDLCALC 52 04/20/2013   LDLCALC 57 12/13/2011     Current Medications: Current Outpatient Medications  Medication Sig Dispense Refill  . ACCU-CHEK SOFTCLIX LANCETS lancets     . albuterol (PROVENTIL HFA;VENTOLIN HFA) 108 (90 Base) MCG/ACT inhaler Inhale 1-2 puffs into the lungs every 6 (six) hours as needed for wheezing or shortness of breath. Use with spacer 1 Inhaler 0  . aspirin 81 MG tablet Take 81 mg by mouth daily. AM    . atorvastatin (LIPITOR) 10 MG tablet Take 10 mg by mouth daily. AM    . benzonatate (TESSALON) 200 MG capsule  Take one cap TID PRN cough 30 capsule 0  . buPROPion (WELLBUTRIN) 75 MG tablet Take 1 tablet (75 mg total) by mouth every morning. 30 tablet 2  . Calcium Carb-Cholecalciferol (CALCIUM-VITAMIN D) 500-400 MG-UNIT TABS Take 1 tablet by mouth daily.     Marland Kitchen doxycycline (VIBRAMYCIN) 100 MG capsule Take 1 capsule (100 mg total) by mouth 2 (two) times daily. 14 capsule 0  . escitalopram (LEXAPRO) 20 MG tablet Take 1 tablet (20 mg total) by mouth daily. AM 90 tablet 2  . lisinopril-hydrochlorothiazide (PRINZIDE,ZESTORETIC) 20-12.5 MG tablet Take 1 tablet by mouth daily.    . memantine (NAMENDA) 10 MG tablet Take 1 tablet (10 mg total) by mouth daily. PM 90  tablet 3  . metoCLOPramide (REGLAN) 10 MG tablet Take 1 tablet (10 mg total) by mouth every 6 (six) hours as needed for nausea. 6 tablet 0  . Multiple Vitamin (THERA) TABS Take 1 tablet by mouth daily.     . Multiple Vitamins-Minerals (VISION-VITE PRESERVE PO) Take by mouth.    . saxagliptin HCl (ONGLYZA) 2.5 MG TABS tablet Take 2.5 mg by mouth daily.    . vitamin B-12 (CYANOCOBALAMIN) 500 MCG tablet Take 500 mcg by mouth daily. AM     No current facility-administered medications for this visit.     Neurologic: Headache: No Seizure: No Paresthesias:No  Musculoskeletal: Strength & Muscle Tone: within normal limits Gait & Station: normal Patient leans: N/A  Psychiatric Specialty Exam: Review of Systems  Musculoskeletal: Positive for back pain and neck pain.  Neurological: Positive for tingling.  Endo/Heme/Allergies: Positive for environmental allergies.  Psychiatric/Behavioral: Positive for depression. The patient is nervous/anxious.   All other systems reviewed and are negative.   Blood pressure (!) 149/71, pulse 78, temperature 98.4 F (36.9 C), temperature source Oral, weight 122 lb 3.2 oz (55.4 kg).Body mass index is 18.58 kg/m.  General Appearance: Fairly Groomed  Eye Contact:  Fair  Speech:  Clear and Coherent and Normal Rate   Volume:  Normal  Mood:  Euthymic  Affect:  Appropriate and Congruent  Thought Process:  Coherent and Goal Directed  Orientation:  Full (Time, Place, and Person)  Thought Content:  WDL  Suicidal Thoughts:  No  Homicidal Thoughts:  No  Memory:  Immediate;   Fair  Judgement:  Fair  Insight:  Fair  Psychomotor Activity:  Normal  Concentration:  Fair  Recall:  [employer] of Knowledge:Fair  Language: Fair  Akathisia:  No  Handed:  Right  AIMS (if indicated):    Assets:  Communication Skills Desire for Improvement Physical Health Social Support  ADL's:  Intact  Cognition: WNL  Sleep:      Treatment Plan Summary: Medication management   Continue meds as follows Discussed with patient about her medications treatment risk benefits and alternatives. Continue  Wellbutrin 75 mg in the morning   Lexapro 20 mg daily She will continue on Namenda 10 mg daily. Patient was given 3 months supply of the medications.  Follow-up in 2 months or earlier depending on her symptoms.     More than 50% of the time spent in psychoeducation, counseling and coordination of care.    This note was generated in part or whole with voice recognition software. Voice regonition is usually quite accurate but there are transcription errors that can and very often do occur. I apologize for any typographical errors that were not detected and corrected.    Rainey Pines, MD 1/17/201911:32 AM

## 2017-10-07 NOTE — Telephone Encounter (Signed)
pt called and wanted to have pharmacy updated she forgot that her daughter changed her pharmacy from [facility] to [facility] in [place].  so pharmacy was updated in out system. pt states that they already have medication transfered to [facility].

## 2017-10-25 ENCOUNTER — Ambulatory Visit: Payer: Medicare Other | Admitting: Psychiatry

## 2017-10-25 ENCOUNTER — Encounter: Payer: Self-pay | Admitting: Psychiatry

## 2017-10-25 ENCOUNTER — Other Ambulatory Visit: Payer: Self-pay

## 2017-10-25 VITALS — BP 166/75 | HR 72 | Temp 97.7°F | Wt 120.6 lb

## 2017-10-25 DIAGNOSIS — R4189 Other symptoms and signs involving cognitive functions and awareness: Secondary | ICD-10-CM | POA: Diagnosis not present

## 2017-10-25 DIAGNOSIS — R4689 Other symptoms and signs involving appearance and behavior: Secondary | ICD-10-CM | POA: Diagnosis not present

## 2017-10-25 DIAGNOSIS — F331 Major depressive disorder, recurrent, moderate: Secondary | ICD-10-CM

## 2017-10-25 NOTE — Progress Notes (Signed)
Psychiatric MD Progress Note  Patient Identification: Lori Gilmore MRN:  [id number] Date of Evaluation:  10/25/2017 Referral Source: [facility]  Chief Complaint:   Chief Complaint    Follow-up; Medication Refill     Visit Diagnosis:    ICD-10-CM   1. MDD (major depressive disorder), recurrent episode, moderate (HCC) F33.1   2. Cognitive and behavioral changes R41.89    R46.89     History of Present Illness:   Patient is a 84 year-old widowed female who presented for follow-up. She reported that she was upset as her sister fell last week and she went to see her in [place]. However her son-in-law did not allow her to stay there. She reported that she has been calling and checking on her. Patient reported that she is concerned about her sister. Patient reported that she is interested in going to the senior center so she can become more proactive and spend time with other people. She reported that she enjoys and loves talking to the people. She has been happy and energetic as usual. She denied having any side effects of the medication and has been compliant with her medications. She denied perceptual disturbances. She denied having any suicidal homicidal ideations.  She does not have any other acute symptoms at this time.      She appeared pleasant and cooperative during the interview.    Associated Signs/Symptoms: Depression Symptoms:  difficulty concentrating, impaired memory, anxiety, (Hypo) Manic Symptoms:  none Anxiety Symptoms:  Excessive Worry, Psychotic Symptoms:  none PTSD Symptoms: Negative NA  Past Psychiatric History:   Patient has history of depression and has been following with Dr. Bridgett Larsson and [facility]. She reported that she has history of multiple psychiatric hospitalizations in the past when her daughter passed away at the age of 4.  Previous Psychotropic Medications: She does not remember the name of her previous  medications.  Substance Abuse History in the last 12 months:  No.  Consequences of Substance Abuse: Negative NA  Past Medical History:  Past Medical History:  Diagnosis Date  . Anemia   . Arthritis    right knee  . Blood clots in brain   . Depression   . Diabetes mellitus without complication ([place])    Diet controlled  . Headache    sinus  . History of hiatal hernia   . Hypercholesteremia   . Hypertension   . Renal cancer ([place])    right kidney  . Seasonal allergies   . TIA (transient ischemic attack) 2000   no deficits  . Wears dentures    full upper and lower    Past Surgical History:  Procedure Laterality Date  . ABDOMINAL HYSTERECTOMY    . APPENDECTOMY    . BRAIN SURGERY     for blood clot  . BREAST BIOPSY Left 03/21/13   u/s bx-neg  . BREAST BIOPSY Left    neg  . BREAST CYST ASPIRATION Left    neg  . CATARACT EXTRACTION W/PHACO Right 02/13/2015   Procedure: CATARACT EXTRACTION PHACO AND INTRAOCULAR LENS PLACEMENT (IOC);  Surgeon: Leandrew Koyanagi, MD;  Location: [place];  Service: Ophthalmology;  Laterality: Right;  DIABETIC  . CATARACT EXTRACTION W/PHACO Left 03/20/2015   Procedure: CATARACT EXTRACTION PHACO AND INTRAOCULAR LENS PLACEMENT (IOC);  Surgeon: Leandrew Koyanagi, MD;  Location: [place];  Service: Ophthalmology;  Laterality: Left;  . CHOLECYSTECTOMY      Family Psychiatric History:  Pt denied   Family History:  Family History  Problem Relation Age of Onset  . Diabetes Daughter   . Breast cancer Neg Hx     Social History:   Social History   Socioeconomic History  . Marital status: Widowed    Spouse name: None  . Number of children: 4  . Years of education: None  . Highest education level: 9th grade  Social Needs  . Financial resource strain: Not very hard  . Food insecurity - worry: Never true  . Food insecurity - inability: Never true  . Transportation needs - medical: No  . Transportation needs -  non-medical: No  Occupational History    Comment: retired  Tobacco Use  . Smoking status: Never Smoker  . Smokeless tobacco: Never Used  Substance and Sexual Activity  . Alcohol use: No  . Drug use: No  . Sexual activity: No  Other Topics Concern  . None  Social History Narrative  . None    Additional Social History:  Patient is currently widowed. She is living with her daughter.  Allergies:   Allergies  Allergen Reactions  . Erythromycin Other (See Comments)  . Oxycodone-Acetaminophen Nausea And Vomiting  . Oxycodone-Acetaminophen Nausea And Vomiting  . Tapentadol Other (See Comments)  . Amoxicillin Nausea Only  . Ezetimibe     Other reaction(s): Unknown  . Gabapentin Other (See Comments)    dizzy  . Glucosamine-Chondroitin     Other reaction(s): Unknown  . Lovastatin Other (See Comments)  . Omeprazole Nausea Only  . Azithromycin Nausea Only  . Erythromycin Base Rash  . Heparin Rash and Other (See Comments)  . Other Rash    Metabolic Disorder Labs: Lab Results  Component Value Date   HGBA1C 7.7 (H) 09/10/2016   MPG 174 09/10/2016   No results found for: PROLACTIN Lab Results  Component Value Date   CHOL 122 04/20/2013   TRIG 19 04/20/2013   HDL 66 (H) 04/20/2013   VLDL 4 (L) 04/20/2013   LDLCALC 52 04/20/2013   LDLCALC 57 12/13/2011     Current Medications: Current Outpatient Medications  Medication Sig Dispense Refill  . ACCU-CHEK SOFTCLIX LANCETS lancets     . albuterol (PROVENTIL HFA;VENTOLIN HFA) 108 (90 Base) MCG/ACT inhaler Inhale 1-2 puffs into the lungs every 6 (six) hours as needed for wheezing or shortness of breath. Use with spacer 1 Inhaler 0  . aspirin 81 MG tablet Take 81 mg by mouth daily. AM    . atorvastatin (LIPITOR) 10 MG tablet Take 10 mg by mouth daily. AM    . benzonatate (TESSALON) 200 MG capsule Take one cap TID PRN cough 30 capsule 0  . buPROPion (WELLBUTRIN) 75 MG tablet Take 1 tablet (75 mg total) by mouth every morning.  30 tablet 2  . Calcium Carb-Cholecalciferol (CALCIUM-VITAMIN D) 500-400 MG-UNIT TABS Take 1 tablet by mouth daily.     Marland Kitchen doxycycline (VIBRAMYCIN) 100 MG capsule Take 1 capsule (100 mg total) by mouth 2 (two) times daily. 14 capsule 0  . escitalopram (LEXAPRO) 20 MG tablet Take 1 tablet (20 mg total) by mouth daily. AM 90 tablet 2  . lisinopril-hydrochlorothiazide (PRINZIDE,ZESTORETIC) 20-12.5 MG tablet Take 1 tablet by mouth daily.    . memantine (NAMENDA) 10 MG tablet Take 1 tablet (10 mg total) by mouth daily. PM 90 tablet 3  . metoCLOPramide (REGLAN) 10 MG tablet Take 1 tablet (10 mg total) by mouth every 6 (six) hours as needed for nausea. 6 tablet 0  . Multiple Vitamin (THERA) TABS  Take 1 tablet by mouth daily.     . Multiple Vitamins-Minerals (VISION-VITE PRESERVE PO) Take by mouth.    . saxagliptin HCl (ONGLYZA) 2.5 MG TABS tablet Take 2.5 mg by mouth daily.    . vitamin B-12 (CYANOCOBALAMIN) 500 MCG tablet Take 500 mcg by mouth daily. AM     No current facility-administered medications for this visit.     Neurologic: Headache: No Seizure: No Paresthesias:No  Musculoskeletal: Strength & Muscle Tone: within normal limits Gait & Station: normal Patient leans: N/A  Psychiatric Specialty Exam: Review of Systems  Musculoskeletal: Positive for back pain and neck pain.  Neurological: Positive for tingling.  Endo/Heme/Allergies: Positive for environmental allergies.  Psychiatric/Behavioral: Positive for depression. The patient is nervous/anxious.   All other systems reviewed and are negative.   Blood pressure (!) 166/75, pulse 72, temperature 97.7 F (36.5 C), temperature source Oral, weight 120 lb 9.6 oz (54.7 kg).Body mass index is 18.34 kg/m.  General Appearance: Fairly Groomed  Eye Contact:  Fair  Speech:  Clear and Coherent and Normal Rate  Volume:  Normal  Mood:  Euthymic  Affect:  Appropriate and Congruent  Thought Process:  Coherent and Goal Directed  Orientation:   Full (Time, Place, and Person)  Thought Content:  WDL  Suicidal Thoughts:  No  Homicidal Thoughts:  No  Memory:  Immediate;   Fair  Judgement:  Fair  Insight:  Fair  Psychomotor Activity:  Normal  Concentration:  Fair  Recall:  [employer] of Knowledge:Fair  Language: Fair  Akathisia:  No  Handed:  Right  AIMS (if indicated):    Assets:  Communication Skills Desire for Improvement Physical Health Social Support  ADL's:  Intact  Cognition: WNL  Sleep:      Treatment Plan Summary: Medication management   Continue meds as follows Discussed with patient about her medications treatment risk benefits and alternatives. Continue  Wellbutrin 75 mg in the morning   Lexapro 20 mg daily She will continue on Namenda 10 mg daily.   Patient has supply of the medications. She will follow-up in one month..     More than 50% of the time spent in psychoeducation, counseling and coordination of care.    This note was generated in part or whole with voice recognition software. Voice regonition is usually quite accurate but there are transcription errors that can and very often do occur. I apologize for any typographical errors that were not detected and corrected.    Rainey Pines, MD 2/4/201911:29 AM

## 2017-11-08 ENCOUNTER — Other Ambulatory Visit: Payer: Self-pay | Admitting: Internal Medicine

## 2017-11-08 DIAGNOSIS — Z1231 Encounter for screening mammogram for malignant neoplasm of breast: Secondary | ICD-10-CM

## 2017-11-22 ENCOUNTER — Encounter: Payer: Self-pay | Admitting: Psychiatry

## 2017-11-22 ENCOUNTER — Ambulatory Visit: Payer: Medicare Other | Admitting: Psychiatry

## 2017-11-22 VITALS — BP 140/70 | HR 69 | Ht 68.0 in | Wt 120.0 lb

## 2017-11-22 DIAGNOSIS — F331 Major depressive disorder, recurrent, moderate: Secondary | ICD-10-CM | POA: Diagnosis not present

## 2017-11-22 MED ORDER — BUPROPION HCL 75 MG PO TABS: 75.0000 mg | ORAL_TABLET | Freq: Every morning | ORAL | 2 refills | Status: DC

## 2017-11-22 NOTE — Progress Notes (Signed)
Psychiatric MD Progress Note  Patient Identification: Lori Gilmore MRN:  [id number] Date of Evaluation:  11/22/2017 Referral Source: [facility]  Chief Complaint:    Visit Diagnosis:    ICD-10-CM   1. MDD (major depressive disorder), recurrent episode, moderate (HCC) F33.1     History of Present Illness:   Patient is a 84 year-old widowed female who presented for follow-up. She reported that  her brother-in-law fell and he is currently in the nursing home. Her sister has been moved to the [employer] as she is currently 82 years old. Patient reported that now she can visit her sister more regularly as she was living in [place] and she was unable to drive that far. Patient reported that she has been more supportive of her sister. Patient appeared calm and alert during the interview. She reported that she enjoys coming to this office and discussing about her issues. She has been compliant with her medications. She currently denied having any side effects. She reported that she takes them as prescribed. She has fair relationship with her sister and her brother-in-law. She also has been paying her daughter $100 on a monthly basis. She denied having any suicidal homicidal ideations or plans. She denied having any perceptual disturbances at this time.    She appeared pleasant and cooperative during the interview.    Associated Signs/Symptoms: Depression Symptoms:  difficulty concentrating, impaired memory, anxiety, (Hypo) Manic Symptoms:  none Anxiety Symptoms:  Excessive Worry, Psychotic Symptoms:  none PTSD Symptoms: Negative NA  Past Psychiatric History:   Patient has history of depression and has been following with Dr. Bridgett Larsson and [facility]. She reported that she has history of multiple psychiatric hospitalizations in the past when her daughter passed away at the age of 63.  Previous Psychotropic Medications: She does not remember the name of her  previous medications.  Substance Abuse History in the last 12 months:  No.  Consequences of Substance Abuse: Negative NA  Past Medical History:  Past Medical History:  Diagnosis Date  . Anemia   . Arthritis    right knee  . Blood clots in brain   . Depression   . Diabetes mellitus without complication ([place])    Diet controlled  . Headache    sinus  . History of hiatal hernia   . Hypercholesteremia   . Hypertension   . Renal cancer ([place])    right kidney  . Seasonal allergies   . TIA (transient ischemic attack) 2000   no deficits  . Wears dentures    full upper and lower    Past Surgical History:  Procedure Laterality Date  . ABDOMINAL HYSTERECTOMY    . APPENDECTOMY    . BRAIN SURGERY     for blood clot  . BREAST BIOPSY Left 03/21/13   u/s bx-neg  . BREAST BIOPSY Left    neg  . BREAST CYST ASPIRATION Left    neg  . CATARACT EXTRACTION W/PHACO Right 02/13/2015   Procedure: CATARACT EXTRACTION PHACO AND INTRAOCULAR LENS PLACEMENT (IOC);  Surgeon: Leandrew Koyanagi, MD;  Location: [facility];  Service: Ophthalmology;  Laterality: Right;  DIABETIC  . CATARACT EXTRACTION W/PHACO Left 03/20/2015   Procedure: CATARACT EXTRACTION PHACO AND INTRAOCULAR LENS PLACEMENT (IOC);  Surgeon: Leandrew Koyanagi, MD;  Location: [facility];  Service: Ophthalmology;  Laterality: Left;  . CHOLECYSTECTOMY      Family Psychiatric History:  Pt denied   Family History:  Family History  Problem Relation Age  of Onset  . Diabetes Daughter   . Breast cancer Neg Hx     Social History:   Social History   Socioeconomic History  . Marital status: Widowed    Spouse name: None  . Number of children: 4  . Years of education: None  . Highest education level: 9th grade  Social Needs  . Financial resource strain: Not very hard  . Food insecurity - worry: Never true  . Food insecurity - inability: Never true  . Transportation needs - medical: No  . Transportation needs  - non-medical: No  Occupational History    Comment: retired  Tobacco Use  . Smoking status: Never Smoker  . Smokeless tobacco: Never Used  Substance and Sexual Activity  . Alcohol use: No  . Drug use: No  . Sexual activity: No  Other Topics Concern  . None  Social History Narrative  . None    Additional Social History:  Patient is currently widowed. She is living with her daughter.  Allergies:   Allergies  Allergen Reactions  . Erythromycin Other (See Comments)  . Oxycodone-Acetaminophen Nausea And Vomiting  . Oxycodone-Acetaminophen Nausea And Vomiting  . Tapentadol Other (See Comments)  . Amoxicillin Nausea Only  . Ezetimibe     Other reaction(s): Unknown  . Gabapentin Other (See Comments)    dizzy  . Glucosamine-Chondroitin     Other reaction(s): Unknown  . Lovastatin Other (See Comments)  . Omeprazole Nausea Only  . Azithromycin Nausea Only  . Erythromycin Base Rash  . Heparin Rash and Other (See Comments)  . Other Rash    Metabolic Disorder Labs: Lab Results  Component Value Date   HGBA1C 7.7 (H) 09/10/2016   MPG 174 09/10/2016   No results found for: PROLACTIN Lab Results  Component Value Date   CHOL 122 04/20/2013   TRIG 19 04/20/2013   HDL 66 (H) 04/20/2013   VLDL 4 (L) 04/20/2013   LDLCALC 52 04/20/2013   LDLCALC 57 12/13/2011     Current Medications: Current Outpatient Medications  Medication Sig Dispense Refill  . ACCU-CHEK SOFTCLIX LANCETS lancets     . albuterol (PROVENTIL HFA;VENTOLIN HFA) 108 (90 Base) MCG/ACT inhaler Inhale 1-2 puffs into the lungs every 6 (six) hours as needed for wheezing or shortness of breath. Use with spacer 1 Inhaler 0  . aspirin 81 MG tablet Take 81 mg by mouth daily. AM    . atorvastatin (LIPITOR) 10 MG tablet Take 10 mg by mouth daily. AM    . benzonatate (TESSALON) 200 MG capsule Take one cap TID PRN cough 30 capsule 0  . buPROPion (WELLBUTRIN) 75 MG tablet Take 1 tablet (75 mg total) by mouth every morning.  30 tablet 2  . Calcium Carb-Cholecalciferol (CALCIUM-VITAMIN D) 500-400 MG-UNIT TABS Take 1 tablet by mouth daily.     Marland Kitchen doxycycline (VIBRAMYCIN) 100 MG capsule Take 1 capsule (100 mg total) by mouth 2 (two) times daily. 14 capsule 0  . escitalopram (LEXAPRO) 20 MG tablet Take 1 tablet (20 mg total) by mouth daily. AM 90 tablet 2  . lisinopril-hydrochlorothiazide (PRINZIDE,ZESTORETIC) 20-12.5 MG tablet Take 1 tablet by mouth daily.    . memantine (NAMENDA) 10 MG tablet Take 1 tablet (10 mg total) by mouth daily. PM 90 tablet 3  . metoCLOPramide (REGLAN) 10 MG tablet Take 1 tablet (10 mg total) by mouth every 6 (six) hours as needed for nausea. 6 tablet 0  . Multiple Vitamins-Minerals (VISION-VITE PRESERVE PO) Take by mouth.    Marland Kitchen  saxagliptin HCl (ONGLYZA) 2.5 MG TABS tablet Take 2.5 mg by mouth daily.    . vitamin B-12 (CYANOCOBALAMIN) 500 MCG tablet Take 500 mcg by mouth daily. AM    . Multiple Vitamin (THERA) TABS Take 1 tablet by mouth daily.      No current facility-administered medications for this visit.     Neurologic: Headache: No Seizure: No Paresthesias:No  Musculoskeletal: Strength & Muscle Tone: within normal limits Gait & Station: normal Patient leans: N/A  Psychiatric Specialty Exam: Review of Systems  Musculoskeletal: Positive for back pain and neck pain.  Neurological: Positive for tingling.  Endo/Heme/Allergies: Positive for environmental allergies.  Psychiatric/Behavioral: Positive for depression. The patient is nervous/anxious.   All other systems reviewed and are negative.   Blood pressure 140/70, pulse 69, height 5\' 8"  (1.727 m), weight 120 lb (54.4 kg), SpO2 98 %.Body mass index is 18.25 kg/m.  General Appearance: Fairly Groomed  Eye Contact:  Fair  Speech:  Clear and Coherent and Normal Rate  Volume:  Normal  Mood:  Euthymic  Affect:  Appropriate and Congruent  Thought Process:  Coherent and Goal Directed  Orientation:  Full (Time, Place, and Person)   Thought Content:  WDL  Suicidal Thoughts:  No  Homicidal Thoughts:  No  Memory:  Immediate;   Fair  Judgement:  Fair  Insight:  Fair  Psychomotor Activity:  Normal  Concentration:  Fair  Recall:  [employer] of Knowledge:Fair  Language: Fair  Akathisia:  No  Handed:  Right  AIMS (if indicated):    Assets:  Communication Skills Desire for Improvement Physical Health Social Support  ADL's:  Intact  Cognition: WNL  Sleep:      Treatment Plan Summary: Medication management   Continue meds as follows Discussed with patient about her medications treatment risk benefits and alternatives. Continue  Wellbutrin 75 mg in the morning   Lexapro 20 mg daily She will continue on Namenda 10 mg daily.   She will follow-up in 2 month..     More than 50% of the time spent in psychoeducation, counseling and coordination of care.    This note was generated in part or whole with voice recognition software. Voice regonition is usually quite accurate but there are transcription errors that can and very often do occur. I apologize for any typographical errors that were not detected and corrected.    Rainey Pines, MD 3/4/201911:31 AM

## 2017-11-23 ENCOUNTER — Ambulatory Visit
Admission: RE | Admit: 2017-11-23 | Discharge: 2017-11-23 | Disposition: A | Discharge status: Home / Self Care | Payer: Medicare Other | Source: Ambulatory Visit | Attending: Internal Medicine | Admitting: Internal Medicine

## 2017-11-23 ENCOUNTER — Inpatient Hospital Stay: Admission: RE | Admit: 2017-11-23 | Payer: Medicare Other | Source: Ambulatory Visit

## 2017-11-23 DIAGNOSIS — Z1231 Encounter for screening mammogram for malignant neoplasm of breast: Secondary | ICD-10-CM | POA: Insufficient documentation

## 2017-11-27 ENCOUNTER — Emergency Department: Payer: No Typology Code available for payment source

## 2017-11-27 ENCOUNTER — Other Ambulatory Visit: Payer: Self-pay

## 2017-11-27 ENCOUNTER — Emergency Department
Admission: EM | Admit: 2017-11-27 | Discharge: 2017-11-27 | Disposition: A | Discharge status: Home / Self Care | Payer: No Typology Code available for payment source | Attending: Emergency Medicine | Admitting: Emergency Medicine

## 2017-11-27 DIAGNOSIS — E1122 Type 2 diabetes mellitus with diabetic chronic kidney disease: Secondary | ICD-10-CM | POA: Insufficient documentation

## 2017-11-27 DIAGNOSIS — I129 Hypertensive chronic kidney disease with stage 1 through stage 4 chronic kidney disease, or unspecified chronic kidney disease: Secondary | ICD-10-CM | POA: Diagnosis not present

## 2017-11-27 DIAGNOSIS — S60222A Contusion of left hand, initial encounter: Secondary | ICD-10-CM | POA: Diagnosis not present

## 2017-11-27 DIAGNOSIS — Y998 Other external cause status: Secondary | ICD-10-CM | POA: Insufficient documentation

## 2017-11-27 DIAGNOSIS — Y92410 Unspecified street and highway as the place of occurrence of the external cause: Secondary | ICD-10-CM | POA: Diagnosis not present

## 2017-11-27 DIAGNOSIS — Z7982 Long term (current) use of aspirin: Secondary | ICD-10-CM | POA: Insufficient documentation

## 2017-11-27 DIAGNOSIS — S161XXA Strain of muscle, fascia and tendon at neck level, initial encounter: Secondary | ICD-10-CM | POA: Diagnosis not present

## 2017-11-27 DIAGNOSIS — N183 Chronic kidney disease, stage 3 (moderate): Secondary | ICD-10-CM | POA: Diagnosis not present

## 2017-11-27 DIAGNOSIS — Y9389 Activity, other specified: Secondary | ICD-10-CM | POA: Diagnosis not present

## 2017-11-27 DIAGNOSIS — S6982XA Other specified injuries of left wrist, hand and finger(s), initial encounter: Secondary | ICD-10-CM | POA: Diagnosis present

## 2017-11-27 DIAGNOSIS — E1142 Type 2 diabetes mellitus with diabetic polyneuropathy: Secondary | ICD-10-CM | POA: Diagnosis not present

## 2017-11-27 DIAGNOSIS — Z7984 Long term (current) use of oral hypoglycemic drugs: Secondary | ICD-10-CM | POA: Insufficient documentation
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
## 2017-11-27 NOTE — Discharge Instructions (Signed)
Please seek medical attention for any high fevers, chest pain, shortness of breath, change in behavior, persistent vomiting, bloody stool or any other new or concerning symptoms.  
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
## 2018-01-24 ENCOUNTER — Ambulatory Visit: Payer: Medicare Other | Admitting: Psychiatry

## 2018-01-27 ENCOUNTER — Ambulatory Visit: Payer: Medicare Other | Admitting: Psychiatry

## 2018-01-27 ENCOUNTER — Encounter: Payer: Self-pay | Admitting: Psychiatry

## 2018-01-27 ENCOUNTER — Other Ambulatory Visit: Payer: Self-pay

## 2018-01-27 VITALS — BP 160/82 | HR 74 | Temp 98.3°F | Wt 120.8 lb

## 2018-01-27 DIAGNOSIS — R4189 Other symptoms and signs involving cognitive functions and awareness: Secondary | ICD-10-CM

## 2018-01-27 DIAGNOSIS — R4689 Other symptoms and signs involving appearance and behavior: Secondary | ICD-10-CM

## 2018-01-27 DIAGNOSIS — F331 Major depressive disorder, recurrent, moderate: Secondary | ICD-10-CM

## 2018-01-27 MED ORDER — BUPROPION HCL 75 MG PO TABS: 75.0000 mg | ORAL_TABLET | Freq: Every morning | ORAL | 2 refills | Status: DC

## 2018-01-27 MED ORDER — ESCITALOPRAM OXALATE 20 MG PO TABS: 20.0000 mg | ORAL_TABLET | Freq: Every day | ORAL | 2 refills | Status: DC

## 2018-01-27 MED ORDER — MEMANTINE HCL 10 MG PO TABS: 10.0000 mg | ORAL_TABLET | Freq: Every day | ORAL | 3 refills | Status: DC
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
## 2018-03-28 ENCOUNTER — Other Ambulatory Visit: Payer: Self-pay

## 2018-03-28 ENCOUNTER — Encounter: Payer: Self-pay | Admitting: Psychiatry

## 2018-03-28 ENCOUNTER — Ambulatory Visit (INDEPENDENT_AMBULATORY_CARE_PROVIDER_SITE_OTHER): Payer: Medicare Other | Admitting: Psychiatry

## 2018-03-28 VITALS — BP 145/69 | HR 78 | Temp 98.5°F | Wt 119.8 lb

## 2018-03-28 DIAGNOSIS — R4189 Other symptoms and signs involving cognitive functions and awareness: Secondary | ICD-10-CM | POA: Diagnosis not present

## 2018-03-28 DIAGNOSIS — F331 Major depressive disorder, recurrent, moderate: Secondary | ICD-10-CM

## 2018-03-28 DIAGNOSIS — R4689 Other symptoms and signs involving appearance and behavior: Secondary | ICD-10-CM | POA: Diagnosis not present

## 2018-03-28 MED ORDER — ESCITALOPRAM OXALATE 20 MG PO TABS: 20.0000 mg | ORAL_TABLET | Freq: Every day | ORAL | 2 refills | Status: DC

## 2018-03-28 MED ORDER — BUPROPION HCL 75 MG PO TABS: 75.0000 mg | ORAL_TABLET | Freq: Every morning | ORAL | 2 refills | Status: DC

## 2018-03-28 MED ORDER — MEMANTINE HCL 10 MG PO TABS: 10.0000 mg | ORAL_TABLET | Freq: Every day | ORAL | 3 refills | Status: DC
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
## 2018-05-02 ENCOUNTER — Ambulatory Visit: Payer: Medicare Other | Admitting: Psychiatry

## 2018-05-16 ENCOUNTER — Other Ambulatory Visit: Payer: Self-pay

## 2018-05-16 ENCOUNTER — Encounter: Payer: Self-pay | Admitting: Psychiatry

## 2018-05-16 ENCOUNTER — Ambulatory Visit: Payer: Medicare Other | Admitting: Psychiatry

## 2018-05-16 VITALS — BP 126/64 | HR 71 | Temp 98.8°F | Wt 120.4 lb

## 2018-05-16 DIAGNOSIS — R4189 Other symptoms and signs involving cognitive functions and awareness: Secondary | ICD-10-CM | POA: Diagnosis not present

## 2018-05-16 DIAGNOSIS — F331 Major depressive disorder, recurrent, moderate: Secondary | ICD-10-CM

## 2018-05-16 DIAGNOSIS — R4689 Other symptoms and signs involving appearance and behavior: Secondary | ICD-10-CM

## 2018-05-16 NOTE — Progress Notes (Signed)
Psychiatric MD Progress Note  Patient Identification: Lori Gilmore MRN:  [id number] Date of Evaluation:  05/16/2018 Referral Source: [facility]  Chief Complaint:   Chief Complaint    Follow-up; Medication Refill     Visit Diagnosis:    ICD-10-CM   1. MDD (major depressive disorder), recurrent episode, moderate (HCC) F33.1   2. Cognitive and behavioral changes R41.89    R46.89     History of Present Illness:   Patient is a 84 year-old widowed female who presented for follow-up. She reported that she continues to feel isolated as her daughter does not take her to different places.  She reported that she has been riding the local [employer] [employer]i who brings her to the appointment.  She has not been to her church  for the past 6 weeks.  Her daughter does not like to go to the church on a regular basis.  She reported that she keeps calling her sister and her old friends from home and tries to keep in contact with them as she is a very social person and feels socially isolated.  She stated that she spends most of her time in her room and has been reading books and doing puzzles.  She stated that she lost 5 pounds but then gained it back.  She has poor appetite.  She denied feeling depressed hopeless helpless.  She is motivated to come to her appointment on a regular basis.  We discussed about her medications.  She reported that her memory is improving since she has been taking the Namenda on a regular basis.  She appeared calm and alert during the interview.  She denied having any perceptual disturbances.  She walks with the help of the walker.     She sleeps well at night.  She denied having any suicidal homicidal ideations or plans.  Her memory is age appropriate at this time.     She appeared pleasant and cooperative during the interview.    Associated Signs/Symptoms: Depression Symptoms:  difficulty concentrating, impaired memory, anxiety, (Hypo) Manic Symptoms:  none Anxiety  Symptoms:  Excessive Worry, Psychotic Symptoms:  none PTSD Symptoms: Negative NA  Past Psychiatric History:   Patient has history of depression and has been following with Dr. Bridgett Larsson and [facility]. She reported that she has history of multiple psychiatric hospitalizations in the past when her daughter passed away at the age of 84.  Previous Psychotropic Medications: She does not remember the name of her previous medications.  Substance Abuse History in the last 12 months:  No.  Consequences of Substance Abuse: Negative NA  Past Medical History:  Past Medical History:  Diagnosis Date  . Anemia   . Arthritis    right knee  . Blood clots in brain   . Depression   . Diabetes mellitus without complication ([place])    Diet controlled  . Headache    sinus  . History of hiatal hernia   . Hypercholesteremia   . Hypertension   . Renal cancer ([place])    right kidney  . Seasonal allergies   . TIA (transient ischemic attack) 2000   no deficits  . Wears dentures    full upper and lower    Past Surgical History:  Procedure Laterality Date  . ABDOMINAL HYSTERECTOMY    . APPENDECTOMY    . BRAIN SURGERY     for blood clot  . BREAST BIOPSY Left 03/21/13   u/s bx-neg  . BREAST BIOPSY Left  neg  . BREAST CYST ASPIRATION Left    neg  . CATARACT EXTRACTION W/PHACO Right 02/13/2015   Procedure: CATARACT EXTRACTION PHACO AND INTRAOCULAR LENS PLACEMENT (IOC);  Surgeon: Leandrew Koyanagi, MD;  Location: [place];  Service: Ophthalmology;  Laterality: Right;  DIABETIC  . CATARACT EXTRACTION W/PHACO Left 03/20/2015   Procedure: CATARACT EXTRACTION PHACO AND INTRAOCULAR LENS PLACEMENT (IOC);  Surgeon: Leandrew Koyanagi, MD;  Location: [place];  Service: Ophthalmology;  Laterality: Left;  . CHOLECYSTECTOMY      Family Psychiatric History:  Pt denied   Family History:  Family History  Problem Relation Age of Onset  . Diabetes Daughter   . Breast  cancer Neg Hx     Social History:   Social History   Socioeconomic History  . Marital status: Widowed    Spouse name: Not on file  . Number of children: 4  . Years of education: Not on file  . Highest education level: 9th grade  Occupational History    Comment: retired  Scientific laboratory technician  . Financial resource strain: Not very hard  . Food insecurity:    Worry: Never true    Inability: Never true  . Transportation needs:    Medical: No    Non-medical: No  Tobacco Use  . Smoking status: Never Smoker  . Smokeless tobacco: Never Used  Substance and Sexual Activity  . Alcohol use: No  . Drug use: No  . Sexual activity: Never  Lifestyle  . Physical activity:    Days per week: 0 days    Minutes per session: 0 min  . Stress: To some extent  Relationships  . Social connections:    Talks on phone: More than three times a week    Gets together: Once a week    Attends religious service: More than 4 times per year    Active member of club or organization: No    Attends meetings of clubs or organizations: Never    Relationship status: Widowed  Other Topics Concern  . Not on file  Social History Narrative  . Not on file    Additional Social History:  Patient is currently widowed. She is living with her daughter.  Allergies:   Allergies  Allergen Reactions  . Erythromycin Other (See Comments)  . Oxycodone-Acetaminophen Nausea And Vomiting  . Oxycodone-Acetaminophen Nausea And Vomiting  . Tapentadol Other (See Comments)  . Amoxicillin Nausea Only  . Ezetimibe     Other reaction(s): Unknown  . Gabapentin Other (See Comments)    dizzy  . Glucosamine-Chondroitin     Other reaction(s): Unknown  . Lovastatin Other (See Comments)  . Omeprazole Nausea Only  . Azithromycin Nausea Only  . Erythromycin Base Rash  . Heparin Rash and Other (See Comments)  . Other Rash    Metabolic Disorder Labs: Lab Results  Component Value Date   HGBA1C 7.7 (H) 09/10/2016   MPG 174  09/10/2016   No results found for: PROLACTIN Lab Results  Component Value Date   CHOL 122 04/20/2013   TRIG 19 04/20/2013   HDL 66 (H) 04/20/2013   VLDL 4 (L) 04/20/2013   LDLCALC 52 04/20/2013   LDLCALC 57 12/13/2011     Current Medications: Current Outpatient Medications  Medication Sig Dispense Refill  . ACCU-CHEK SOFTCLIX LANCETS lancets     . albuterol (PROVENTIL HFA;VENTOLIN HFA) 108 (90 Base) MCG/ACT inhaler Inhale 1-2 puffs into the lungs every 6 (six) hours as needed for wheezing or shortness of breath.  Use with spacer 1 Inhaler 0  . aspirin 81 MG tablet Take 81 mg by mouth daily. AM    . atorvastatin (LIPITOR) 10 MG tablet Take 10 mg by mouth daily. AM    . buPROPion (WELLBUTRIN) 75 MG tablet Take 1 tablet (75 mg total) by mouth every morning. 30 tablet 2  . Calcium Carb-Cholecalciferol (CALCIUM-VITAMIN D) 500-400 MG-UNIT TABS Take 1 tablet by mouth daily.     Marland Kitchen escitalopram (LEXAPRO) 20 MG tablet Take 1 tablet (20 mg total) by mouth daily. AM 90 tablet 2  . lisinopril-hydrochlorothiazide (PRINZIDE,ZESTORETIC) 20-12.5 MG tablet Take 1 tablet by mouth daily.    . memantine (NAMENDA) 10 MG tablet Take 1 tablet (10 mg total) by mouth daily. PM 90 tablet 3  . Multiple Vitamin (THERA) TABS Take 1 tablet by mouth daily.     . Multiple Vitamins-Minerals (VISION-VITE PRESERVE PO) Take by mouth.    . saxagliptin HCl (ONGLYZA) 2.5 MG TABS tablet Take 2.5 mg by mouth daily.    . vitamin B-12 (CYANOCOBALAMIN) 500 MCG tablet Take 500 mcg by mouth daily. AM     No current facility-administered medications for this visit.     Neurologic: Headache: No Seizure: No Paresthesias:No  Musculoskeletal: Strength & Muscle Tone: within normal limits Gait & Station: normal Patient leans: N/A  Psychiatric Specialty Exam: Review of Systems  Constitutional: Negative for malaise/fatigue.  HENT: Negative for sinus pain and tinnitus.   Eyes: Positive for photophobia.  Respiratory: Negative  for hemoptysis.   Cardiovascular: Negative for palpitations.  Gastrointestinal: Negative for vomiting.  Genitourinary: Negative for urgency.  Musculoskeletal: Positive for back pain and neck pain.  Skin: Negative for rash.  Neurological: Positive for tingling.  Endo/Heme/Allergies: Positive for environmental allergies.  Psychiatric/Behavioral: Positive for depression. The patient is nervous/anxious.   All other systems reviewed and are negative.   Blood pressure 126/64, pulse 71, temperature 98.8 F (37.1 C), temperature source Oral, weight 120 lb 6.4 oz (54.6 kg).Body mass index is 18.31 kg/m.  General Appearance: Fairly Groomed  Eye Contact:  Fair  Speech:  Clear and Coherent and Normal Rate  Volume:  Normal  Mood:  Euthymic  Affect:  Appropriate and Congruent  Thought Process:  Coherent and Goal Directed  Orientation:  Full (Time, Place, and Person)  Thought Content:  WDL  Suicidal Thoughts:  No  Homicidal Thoughts:  No  Memory:  Immediate;   Fair  Judgement:  Fair  Insight:  Fair  Psychomotor Activity:  Normal  Concentration:  Fair  Recall:  [employer] of Knowledge:Fair  Language: Fair  Akathisia:  No  Handed:  Right  AIMS (if indicated):    Assets:  Communication Skills Desire for Improvement Physical Health Social Support  ADL's:  Intact  Cognition: WNL  Sleep:      Treatment Plan Summary: Medication management   Continue meds as follows Discussed with patient about her medications treatment risk benefits and alternatives. D/c  Wellbutrin  Lexapro 20 mg daily She will continue on Namenda 10 mg daily.   She will follow-up in 1 month..     More than 50% of the time spent in psychoeducation, counseling and coordination of care.    This note was generated in part or whole with voice recognition software. Voice regonition is usually quite accurate but there are transcription errors that can and very often do occur. I apologize for any typographical  errors that were not detected and corrected.    Rainey Pines, MD 8/26/20192:27  PM

## 2018-06-13 ENCOUNTER — Encounter: Payer: Self-pay | Admitting: Psychiatry

## 2018-06-13 ENCOUNTER — Other Ambulatory Visit: Payer: Self-pay

## 2018-06-13 ENCOUNTER — Ambulatory Visit: Payer: Medicare Other | Admitting: Psychiatry

## 2018-06-13 VITALS — BP 143/59 | HR 62 | Temp 98.0°F | Wt 121.4 lb

## 2018-06-13 DIAGNOSIS — R4189 Other symptoms and signs involving cognitive functions and awareness: Secondary | ICD-10-CM

## 2018-06-13 DIAGNOSIS — F331 Major depressive disorder, recurrent, moderate: Secondary | ICD-10-CM

## 2018-06-13 DIAGNOSIS — R4689 Other symptoms and signs involving appearance and behavior: Secondary | ICD-10-CM | POA: Diagnosis not present

## 2018-06-13 NOTE — Progress Notes (Signed)
Psychiatric MD Progress Note  Patient Identification: Lori Gilmore MRN:  [id number] Date of Evaluation:  06/13/2018 Referral Source: [facility]  Chief Complaint:   Chief Complaint    Follow-up; Medication Refill     Visit Diagnosis:    ICD-10-CM   1. MDD (major depressive disorder), recurrent episode, moderate (HCC) F33.1   2. Cognitive and behavioral changes R41.89    R46.89     History of Present Illness:   Patient is a 84 year-old widowed female who presented for follow-up. She reported that her daughter has arranged for her transportation so she is able to go for her doctor's appointment.  She had a good weekend as her daughter from a [place] came to visit her and she went with both her daughters for shopping and lunch.  She reported that her oldest daughter in [place] does not visit her often.  She has poor relationship with her.  Patient reported that she has a started feeling better since her bupropion was stopped and her appetite is improving.  She has gained 1 pound.  She stated that her neuropathy is also improving with the help of gabapentin.  She currently denied having any side effects of the medications.  She appeared calm and alert during the interview.  She stated that she feels well on her medication and has been compliant with them.  She also went to the cemetery as it has been almost 4 years since the death of her husband.  She has been trying to manage her symptoms well with the help of her medications.  She appeared calm and alert during the interview.  No acute symptoms noted at this time.   .  She denied having any suicidal homicidal ideations or plans.  Her memory is age appropriate at this time.     She appeared pleasant and cooperative during the interview.    Associated Signs/Symptoms: Depression Symptoms:  difficulty concentrating, impaired memory, anxiety, (Hypo) Manic Symptoms:  none Anxiety Symptoms:  Excessive Worry, Psychotic  Symptoms:  none PTSD Symptoms: Negative NA  Past Psychiatric History:   Patient has history of depression and has been following with Dr. Bridgett Larsson and [facility]. She reported that she has history of multiple psychiatric hospitalizations in the past when her daughter passed away at the age of 56.  Previous Psychotropic Medications: She does not remember the name of her previous medications.  Substance Abuse History in the last 12 months:  No.  Consequences of Substance Abuse: Negative NA  Past Medical History:  Past Medical History:  Diagnosis Date  . Anemia   . Arthritis    right knee  . Blood clots in brain   . Depression   . Diabetes mellitus without complication ([place])    Diet controlled  . Headache    sinus  . History of hiatal hernia   . Hypercholesteremia   . Hypertension   . Renal cancer ([place])    right kidney  . Seasonal allergies   . TIA (transient ischemic attack) 2000   no deficits  . Wears dentures    full upper and lower    Past Surgical History:  Procedure Laterality Date  . ABDOMINAL HYSTERECTOMY    . APPENDECTOMY    . BRAIN SURGERY     for blood clot  . BREAST BIOPSY Left 03/21/13   u/s bx-neg  . BREAST BIOPSY Left    neg  . BREAST CYST ASPIRATION Left    neg  . CATARACT  EXTRACTION W/PHACO Right 02/13/2015   Procedure: CATARACT EXTRACTION PHACO AND INTRAOCULAR LENS PLACEMENT (IOC);  Surgeon: Leandrew Koyanagi, MD;  Location: [place];  Service: Ophthalmology;  Laterality: Right;  DIABETIC  . CATARACT EXTRACTION W/PHACO Left 03/20/2015   Procedure: CATARACT EXTRACTION PHACO AND INTRAOCULAR LENS PLACEMENT (IOC);  Surgeon: Leandrew Koyanagi, MD;  Location: [place];  Service: Ophthalmology;  Laterality: Left;  . CHOLECYSTECTOMY      Family Psychiatric History:  Pt denied   Family History:  Family History  Problem Relation Age of Onset  . Diabetes Daughter   . Breast cancer Neg Hx     Social History:    Social History   Socioeconomic History  . Marital status: Widowed    Spouse name: Not on file  . Number of children: 4  . Years of education: Not on file  . Highest education level: 9th grade  Occupational History    Comment: retired  Scientific laboratory technician  . Financial resource strain: Not very hard  . Food insecurity:    Worry: Never true    Inability: Never true  . Transportation needs:    Medical: No    Non-medical: No  Tobacco Use  . Smoking status: Never Smoker  . Smokeless tobacco: Never Used  Substance and Sexual Activity  . Alcohol use: No  . Drug use: No  . Sexual activity: Never  Lifestyle  . Physical activity:    Days per week: 0 days    Minutes per session: 0 min  . Stress: To some extent  Relationships  . Social connections:    Talks on phone: More than three times a week    Gets together: Once a week    Attends religious service: More than 4 times per year    Active member of club or organization: No    Attends meetings of clubs or organizations: Never    Relationship status: Widowed  Other Topics Concern  . Not on file  Social History Narrative  . Not on file    Additional Social History:  Patient is currently widowed. She is living with her daughter.  Allergies:   Allergies  Allergen Reactions  . Erythromycin Other (See Comments)  . Oxycodone-Acetaminophen Nausea And Vomiting  . Oxycodone-Acetaminophen Nausea And Vomiting  . Tapentadol Other (See Comments)  . Amoxicillin Nausea Only  . Ezetimibe     Other reaction(s): Unknown  . Glucosamine-Chondroitin     Other reaction(s): Unknown  . Lovastatin Other (See Comments)  . Omeprazole Nausea Only  . Azithromycin Nausea Only  . Erythromycin Base Rash  . Heparin Rash and Other (See Comments)  . Other Rash    Metabolic Disorder Labs: Lab Results  Component Value Date   HGBA1C 7.7 (H) 09/10/2016   MPG 174 09/10/2016   No results found for: PROLACTIN Lab Results  Component Value Date   CHOL  122 04/20/2013   TRIG 19 04/20/2013   HDL 66 (H) 04/20/2013   VLDL 4 (L) 04/20/2013   LDLCALC 52 04/20/2013   LDLCALC 57 12/13/2011     Current Medications: Current Outpatient Medications  Medication Sig Dispense Refill  . ACCU-CHEK SOFTCLIX LANCETS lancets     . albuterol (PROVENTIL HFA;VENTOLIN HFA) 108 (90 Base) MCG/ACT inhaler Inhale 1-2 puffs into the lungs every 6 (six) hours as needed for wheezing or shortness of breath. Use with spacer 1 Inhaler 0  . aspirin 81 MG tablet Take 81 mg by mouth daily. AM    . atorvastatin (  LIPITOR) 10 MG tablet Take 10 mg by mouth daily. AM    . Calcium Carb-Cholecalciferol (CALCIUM-VITAMIN D) 500-400 MG-UNIT TABS Take 1 tablet by mouth daily.     Marland Kitchen escitalopram (LEXAPRO) 20 MG tablet Take 1 tablet (20 mg total) by mouth daily. AM 90 tablet 2  . gabapentin (NEURONTIN) 100 MG capsule Take by mouth.    Marland Kitchen lisinopril-hydrochlorothiazide (PRINZIDE,ZESTORETIC) 20-12.5 MG tablet Take 1 tablet by mouth daily.    . memantine (NAMENDA) 10 MG tablet Take 1 tablet (10 mg total) by mouth daily. PM 90 tablet 3  . Multiple Vitamin (THERA) TABS Take 1 tablet by mouth daily.     . Multiple Vitamins-Minerals (VISION-VITE PRESERVE PO) Take by mouth.    . saxagliptin HCl (ONGLYZA) 2.5 MG TABS tablet Take 2.5 mg by mouth daily.    . vitamin B-12 (CYANOCOBALAMIN) 500 MCG tablet Take 500 mcg by mouth daily. AM     No current facility-administered medications for this visit.     Neurologic: Headache: No Seizure: No Paresthesias:No  Musculoskeletal: Strength & Muscle Tone: within normal limits Gait & Station: normal Patient leans: N/A  Psychiatric Specialty Exam: Review of Systems  Constitutional: Positive for malaise/fatigue.  HENT: Positive for sore throat. Negative for congestion and tinnitus.   Eyes: Positive for photophobia.  Respiratory: Negative for hemoptysis.   Cardiovascular: Negative for palpitations.  Gastrointestinal: Negative for vomiting.   Genitourinary: Negative for urgency.  Musculoskeletal: Positive for joint pain. Negative for back pain and neck pain.  Skin: Negative for rash.  Neurological: Positive for tingling.  Endo/Heme/Allergies: Positive for environmental allergies.  Psychiatric/Behavioral: Positive for depression. The patient is nervous/anxious.   All other systems reviewed and are negative.   Blood pressure (!) 143/59, pulse 62, temperature 98 F (36.7 C), temperature source Oral, weight 121 lb 6.4 oz (55.1 kg).Body mass index is 18.46 kg/m.  General Appearance: Fairly Groomed  Eye Contact:  Fair  Speech:  Clear and Coherent and Normal Rate  Volume:  Normal  Mood:  Euthymic  Affect:  Appropriate and Congruent  Thought Process:  Coherent and Goal Directed  Orientation:  Full (Time, Place, and Person)  Thought Content:  WDL  Suicidal Thoughts:  No  Homicidal Thoughts:  No  Memory:  Immediate;   Fair  Judgement:  Fair  Insight:  Fair  Psychomotor Activity:  Normal  Concentration:  Fair  Recall:  [employer] of Knowledge:Fair  Language: Fair  Akathisia:  No  Handed:  Right  AIMS (if indicated):    Assets:  Communication Skills Desire for Improvement Physical Health Social Support  ADL's:  Intact  Cognition: WNL  Sleep:      Treatment Plan Summary: Medication management   Continue meds as follows Discussed with patient about her medications treatment risk benefits and alternatives. Lexapro 20 mg daily She will continue on Namenda 10 mg daily.   She will follow-up in 2 month..     More than 50% of the time spent in psychoeducation, counseling and coordination of care.    This note was generated in part or whole with voice recognition software. Voice regonition is usually quite accurate but there are transcription errors that can and very often do occur. I apologize for any typographical errors that were not detected and corrected.    Rainey Pines, MD 9/23/201911:06 AM

## 2018-08-08 ENCOUNTER — Encounter: Payer: Self-pay | Admitting: Psychiatry

## 2018-08-08 ENCOUNTER — Ambulatory Visit: Payer: Medicare Other | Admitting: Psychiatry

## 2018-08-08 DIAGNOSIS — F331 Major depressive disorder, recurrent, moderate: Secondary | ICD-10-CM

## 2018-08-08 DIAGNOSIS — R4689 Other symptoms and signs involving appearance and behavior: Secondary | ICD-10-CM | POA: Diagnosis not present

## 2018-08-08 DIAGNOSIS — R4189 Other symptoms and signs involving cognitive functions and awareness: Secondary | ICD-10-CM | POA: Diagnosis not present

## 2018-08-08 MED ORDER — MEMANTINE HCL 10 MG PO TABS: 10.0000 mg | ORAL_TABLET | Freq: Every day | ORAL | 3 refills | Status: DC

## 2018-08-08 MED ORDER — ESCITALOPRAM OXALATE 20 MG PO TABS: 20.0000 mg | ORAL_TABLET | Freq: Every day | ORAL | 2 refills | Status: DC

## 2018-08-08 NOTE — Progress Notes (Signed)
Psychiatric MD Progress Note  Patient Identification: Lori Gilmore MRN:  [id number] Date of Evaluation:  08/08/2018 Referral Source: [facility]  Chief Complaint:    Visit Diagnosis:    ICD-10-CM   1. MDD (major depressive disorder), recurrent episode, moderate (HCC) F33.1   2. Cognitive and behavioral changes R41.89    R46.89     History of Present Illness:   Patient is a 84 year-old widowed female who presented for follow-up. She reported she has been doing well and has been compliant with her medications.  She reported that she will be spending the holiday with her daughter and her family.  She reported that she is not involved in arranging for the holiday dinner as her daughter usually is responsible.  She stated that if she gets upset she spends time in her room.  She won [employer] coupon and bought a jacket for herself and was very excited about the same and was telling me in detail.  Patient reported that she has and the daughter who lives in [place] and will take out her for shopping on Sundays.  She has support of her daughters.  Oldest daughter lives in [place] and does not meet with her often.  Patient stated that she is content with her living situation and does not have any acute issues.   She remains pleasant and cooperative.  She reported that she is compliant with her medications and takes them regularly.  No acute issues noted at this time.  She denied having any is suicidal homicidal ideations or plans at this time.      Her memory is age appropriate at this time.     She appeared pleasant and cooperative during the interview.    Associated Signs/Symptoms: Depression Symptoms:  difficulty concentrating, impaired memory, anxiety, (Hypo) Manic Symptoms:  none Anxiety Symptoms:  Excessive Worry, Psychotic Symptoms:  none PTSD Symptoms: Negative NA  Past Psychiatric History:   Patient has history of depression and has been following with Dr.  Bridgett Larsson and [facility]. She reported that she has history of multiple psychiatric hospitalizations in the past when her daughter passed away at the age of 23.  Previous Psychotropic Medications: She does not remember the name of her previous medications.  Substance Abuse History in the last 12 months:  No.  Consequences of Substance Abuse: Negative NA  Past Medical History:  Past Medical History:  Diagnosis Date  . Anemia   . Arthritis    right knee  . Blood clots in brain   . Depression   . Diabetes mellitus without complication ([place])    Diet controlled  . Headache    sinus  . History of hiatal hernia   . Hypercholesteremia   . Hypertension   . Renal cancer ([place])    right kidney  . Seasonal allergies   . TIA (transient ischemic attack) 2000   no deficits  . Wears dentures    full upper and lower    Past Surgical History:  Procedure Laterality Date  . ABDOMINAL HYSTERECTOMY    . APPENDECTOMY    . BRAIN SURGERY     for blood clot  . BREAST BIOPSY Left 03/21/13   u/s bx-neg  . BREAST BIOPSY Left    neg  . BREAST CYST ASPIRATION Left    neg  . CATARACT EXTRACTION W/PHACO Right 02/13/2015   Procedure: CATARACT EXTRACTION PHACO AND INTRAOCULAR LENS PLACEMENT (IOC);  Surgeon: Leandrew Koyanagi, MD;  Location: [place];  Service: Ophthalmology;  Laterality: Right;  DIABETIC  . CATARACT EXTRACTION W/PHACO Left 03/20/2015   Procedure: CATARACT EXTRACTION PHACO AND INTRAOCULAR LENS PLACEMENT (IOC);  Surgeon: Leandrew Koyanagi, MD;  Location: [place];  Service: Ophthalmology;  Laterality: Left;  . CHOLECYSTECTOMY      Family Psychiatric History:  Pt denied   Family History:  Family History  Problem Relation Age of Onset  . Diabetes Daughter   . Breast cancer Neg Hx     Social History:   Social History   Socioeconomic History  . Marital status: Widowed    Spouse name: Not on file  . Number of children: 4  . Years of  education: Not on file  . Highest education level: 9th grade  Occupational History    Comment: retired  Scientific laboratory technician  . Financial resource strain: Not very hard  . Food insecurity:    Worry: Never true    Inability: Never true  . Transportation needs:    Medical: No    Non-medical: No  Tobacco Use  . Smoking status: Never Smoker  . Smokeless tobacco: Never Used  Substance and Sexual Activity  . Alcohol use: No  . Drug use: No  . Sexual activity: Never  Lifestyle  . Physical activity:    Days per week: 0 days    Minutes per session: 0 min  . Stress: To some extent  Relationships  . Social connections:    Talks on phone: More than three times a week    Gets together: Once a week    Attends religious service: More than 4 times per year    Active member of club or organization: No    Attends meetings of clubs or organizations: Never    Relationship status: Widowed  Other Topics Concern  . Not on file  Social History Narrative  . Not on file    Additional Social History:  Patient is currently widowed. She is living with her daughter.  Allergies:   Allergies  Allergen Reactions  . Erythromycin Other (See Comments)  . Oxycodone-Acetaminophen Nausea And Vomiting  . Oxycodone-Acetaminophen Nausea And Vomiting  . Tapentadol Other (See Comments)  . Amoxicillin Nausea Only  . Ezetimibe     Other reaction(s): Unknown  . Glucosamine-Chondroitin     Other reaction(s): Unknown  . Lovastatin Other (See Comments)  . Omeprazole Nausea Only  . Azithromycin Nausea Only  . Erythromycin Base Rash  . Heparin Rash and Other (See Comments)  . Other Rash    Metabolic Disorder Labs: Lab Results  Component Value Date   HGBA1C 7.7 (H) 09/10/2016   MPG 174 09/10/2016   No results found for: PROLACTIN Lab Results  Component Value Date   CHOL 122 04/20/2013   TRIG 19 04/20/2013   HDL 66 (H) 04/20/2013   VLDL 4 (L) 04/20/2013   LDLCALC 52 04/20/2013   LDLCALC 57 12/13/2011      Current Medications: Current Outpatient Medications  Medication Sig Dispense Refill  . ACCU-CHEK SOFTCLIX LANCETS lancets     . albuterol (PROVENTIL HFA;VENTOLIN HFA) 108 (90 Base) MCG/ACT inhaler Inhale 1-2 puffs into the lungs every 6 (six) hours as needed for wheezing or shortness of breath. Use with spacer 1 Inhaler 0  . aspirin 81 MG tablet Take 81 mg by mouth daily. AM    . atorvastatin (LIPITOR) 10 MG tablet Take 10 mg by mouth daily. AM    . Calcium Carb-Cholecalciferol (CALCIUM-VITAMIN D) 500-400 MG-UNIT TABS Take 1 tablet by  mouth daily.     Marland Kitchen escitalopram (LEXAPRO) 20 MG tablet Take 1 tablet (20 mg total) by mouth daily. AM 90 tablet 2  . gabapentin (NEURONTIN) 100 MG capsule Take by mouth.    Marland Kitchen lisinopril-hydrochlorothiazide (PRINZIDE,ZESTORETIC) 20-12.5 MG tablet Take 1 tablet by mouth daily.    . memantine (NAMENDA) 10 MG tablet Take 1 tablet (10 mg total) by mouth daily. PM 90 tablet 3  . Multiple Vitamin (THERA) TABS Take 1 tablet by mouth daily.     . Multiple Vitamins-Minerals (VISION-VITE PRESERVE PO) Take by mouth.    . saxagliptin HCl (ONGLYZA) 2.5 MG TABS tablet Take 2.5 mg by mouth daily.    . vitamin B-12 (CYANOCOBALAMIN) 500 MCG tablet Take 500 mcg by mouth daily. AM     No current facility-administered medications for this visit.     Neurologic: Headache: No Seizure: No Paresthesias:No  Musculoskeletal: Strength & Muscle Tone: within normal limits Gait & Station: normal Patient leans: N/A  Psychiatric Specialty Exam: Review of Systems  Constitutional: Positive for malaise/fatigue.  HENT: Positive for sore throat. Negative for congestion and tinnitus.   Eyes: Positive for photophobia.  Respiratory: Negative for hemoptysis.   Cardiovascular: Negative for palpitations.  Gastrointestinal: Negative for vomiting.  Genitourinary: Negative for urgency.  Musculoskeletal: Positive for joint pain. Negative for back pain and neck pain.  Skin: Negative for  rash.  Neurological: Positive for tingling.  Endo/Heme/Allergies: Positive for environmental allergies.  Psychiatric/Behavioral: Positive for depression. The patient is nervous/anxious.   All other systems reviewed and are negative.   There were no vitals taken for this visit.There is no height or weight on file to calculate BMI.  General Appearance: Fairly Groomed  Eye Contact:  Fair  Speech:  Clear and Coherent and Normal Rate  Volume:  Normal  Mood:  Euthymic  Affect:  Appropriate and Congruent  Thought Process:  Coherent and Goal Directed  Orientation:  Full (Time, Place, and Person)  Thought Content:  WDL  Suicidal Thoughts:  No  Homicidal Thoughts:  No  Memory:  Immediate;   Fair  Judgement:  Fair  Insight:  Fair  Psychomotor Activity:  Normal  Concentration:  Fair  Recall:  [employer] of Knowledge:Fair  Language: Fair  Akathisia:  No  Handed:  Right  AIMS (if indicated):    Assets:  Communication Skills Desire for Improvement Physical Health Social Support  ADL's:  Intact  Cognition: WNL  Sleep:      Treatment Plan Summary: Medication management   Continue meds as follows Discussed with patient about her medications treatment risk benefits and alternatives. Lexapro 20 mg daily She will continue on Namenda 10 mg daily.   She will follow-up in 2 month..     More than 50% of the time spent in psychoeducation, counseling and coordination of care.    This note was generated in part or whole with voice recognition software. Voice regonition is usually quite accurate but there are transcription errors that can and very often do occur. I apologize for any typographical errors that were not detected and corrected.    Rainey Pines, MD 11/18/201910:49 AM

## 2018-10-03 ENCOUNTER — Other Ambulatory Visit: Payer: Self-pay

## 2018-10-03 ENCOUNTER — Encounter: Payer: Self-pay | Admitting: Psychiatry

## 2018-10-03 ENCOUNTER — Ambulatory Visit: Payer: Medicare Other | Admitting: Psychiatry

## 2018-10-03 VITALS — BP 155/65 | HR 69 | Temp 98.1°F | Wt 123.4 lb

## 2018-10-03 DIAGNOSIS — F331 Major depressive disorder, recurrent, moderate: Secondary | ICD-10-CM

## 2018-10-03 DIAGNOSIS — R4189 Other symptoms and signs involving cognitive functions and awareness: Secondary | ICD-10-CM | POA: Diagnosis not present

## 2018-10-03 DIAGNOSIS — R4689 Other symptoms and signs involving appearance and behavior: Secondary | ICD-10-CM

## 2018-10-03 NOTE — Progress Notes (Signed)
Psychiatric MD Progress Note  Patient Identification: Lori Gilmore MRN:  [id number] Date of Evaluation:  10/03/2018 Referral Source: [facility]  Chief Complaint:   Chief Complaint    Follow-up; Medication Refill     Visit Diagnosis:    ICD-10-CM   1. MDD (major depressive disorder), recurrent episode, moderate (HCC) F33.1   2. Cognitive and behavioral changes R41.89    R46.89     History of Present Illness:   Patient is a 85 year-old widowed female who presented for follow-up. She reported she has been doing well and has been compliant with her medications.  She reported that she in her daughter ride and was feeling excited that she was able to travel by herself.  She stated that she used to live with her daughter and her family.  Patient reported that she spends most of the time in her room and stated that she does not have any problems.  She has gained 3 pounds in the last year.  She reported that her medications are helpful.  She was again placed on gabapentin by her primary care physician as she has diabetic neuropathy.  She reported that she walks with the help of the walker.  She does not have any other medical problems.  She enjoys going out with her daughter on the weekend.  She reported that she has been eating well and sleeping well.  No other acute issues noted at this time.  She is compliant with her medications.     .   She remains pleasant and cooperative.  She denied having any is suicidal homicidal ideations or plans at this time.      Her memory is age appropriate at this time. She was very thankful and was coming to this office and for her visit.       Associated Signs/Symptoms: Depression Symptoms:  difficulty concentrating, impaired memory, anxiety, (Hypo) Manic Symptoms:  none Anxiety Symptoms:  Excessive Worry, Psychotic Symptoms:  none PTSD Symptoms: Negative NA  Past Psychiatric History:   Patient has history of depression and  has been following with Dr. Bridgett Larsson and [facility]. She reported that she has history of multiple psychiatric hospitalizations in the past when her daughter passed away at the age of 54.  Previous Psychotropic Medications: She does not remember the name of her previous medications.  Substance Abuse History in the last 12 months:  No.  Consequences of Substance Abuse: Negative NA  Past Medical History:  Past Medical History:  Diagnosis Date  . Anemia   . Arthritis    right knee  . Blood clots in brain   . Depression   . Diabetes mellitus without complication ([place])    Diet controlled  . Headache    sinus  . History of hiatal hernia   . Hypercholesteremia   . Hypertension   . Renal cancer ([place])    right kidney  . Seasonal allergies   . TIA (transient ischemic attack) 2000   no deficits  . Wears dentures    full upper and lower    Past Surgical History:  Procedure Laterality Date  . ABDOMINAL HYSTERECTOMY    . APPENDECTOMY    . BRAIN SURGERY     for blood clot  . BREAST BIOPSY Left 03/21/13   u/s bx-neg  . BREAST BIOPSY Left    neg  . BREAST CYST ASPIRATION Left    neg  . CATARACT EXTRACTION W/PHACO Right 02/13/2015   Procedure: CATARACT EXTRACTION  PHACO AND INTRAOCULAR LENS PLACEMENT (IOC);  Surgeon: Leandrew Koyanagi, MD;  Location: [place];  Service: Ophthalmology;  Laterality: Right;  DIABETIC  . CATARACT EXTRACTION W/PHACO Left 03/20/2015   Procedure: CATARACT EXTRACTION PHACO AND INTRAOCULAR LENS PLACEMENT (IOC);  Surgeon: Leandrew Koyanagi, MD;  Location: [place];  Service: Ophthalmology;  Laterality: Left;  . CHOLECYSTECTOMY      Family Psychiatric History:  Pt denied   Family History:  Family History  Problem Relation Age of Onset  . Diabetes Daughter   . Breast cancer Neg Hx     Social History:   Social History   Socioeconomic History  . Marital status: Widowed    Spouse name: Not on file  . Number of  children: 4  . Years of education: Not on file  . Highest education level: 9th grade  Occupational History    Comment: retired  Scientific laboratory technician  . Financial resource strain: Not very hard  . Food insecurity:    Worry: Never true    Inability: Never true  . Transportation needs:    Medical: No    Non-medical: No  Tobacco Use  . Smoking status: Never Smoker  . Smokeless tobacco: Never Used  Substance and Sexual Activity  . Alcohol use: No  . Drug use: No  . Sexual activity: Never  Lifestyle  . Physical activity:    Days per week: 0 days    Minutes per session: 0 min  . Stress: To some extent  Relationships  . Social connections:    Talks on phone: More than three times a week    Gets together: Once a week    Attends religious service: More than 4 times per year    Active member of club or organization: No    Attends meetings of clubs or organizations: Never    Relationship status: Widowed  Other Topics Concern  . Not on file  Social History Narrative  . Not on file    Additional Social History:  Patient is currently widowed. She is living with her daughter.  Allergies:   Allergies  Allergen Reactions  . Erythromycin Other (See Comments)  . Oxycodone-Acetaminophen Nausea And Vomiting  . Oxycodone-Acetaminophen Nausea And Vomiting  . Tapentadol Other (See Comments)  . Amoxicillin Nausea Only  . Ezetimibe     Other reaction(s): Unknown  . Glucosamine-Chondroitin     Other reaction(s): Unknown  . Lovastatin Other (See Comments)  . Omeprazole Nausea Only  . Azithromycin Nausea Only  . Erythromycin Base Rash  . Heparin Rash and Other (See Comments)  . Other Rash    Metabolic Disorder Labs: Lab Results  Component Value Date   HGBA1C 7.7 (H) 09/10/2016   MPG 174 09/10/2016   No results found for: PROLACTIN Lab Results  Component Value Date   CHOL 122 04/20/2013   TRIG 19 04/20/2013   HDL 66 (H) 04/20/2013   VLDL 4 (L) 04/20/2013   LDLCALC 52 04/20/2013    LDLCALC 57 12/13/2011     Current Medications: Current Outpatient Medications  Medication Sig Dispense Refill  . ACCU-CHEK SOFTCLIX LANCETS lancets     . albuterol (PROVENTIL HFA;VENTOLIN HFA) 108 (90 Base) MCG/ACT inhaler Inhale 1-2 puffs into the lungs every 6 (six) hours as needed for wheezing or shortness of breath. Use with spacer 1 Inhaler 0  . aspirin 81 MG tablet Take 81 mg by mouth daily. AM    . atorvastatin (LIPITOR) 10 MG tablet Take 10 mg by mouth  daily. AM    . Calcium Carb-Cholecalciferol (CALCIUM-VITAMIN D) 500-400 MG-UNIT TABS Take 1 tablet by mouth daily.     Marland Kitchen escitalopram (LEXAPRO) 20 MG tablet Take 1 tablet (20 mg total) by mouth daily. AM 90 tablet 2  . gabapentin (NEURONTIN) 100 MG capsule Take by mouth.    Marland Kitchen lisinopril-hydrochlorothiazide (PRINZIDE,ZESTORETIC) 20-12.5 MG tablet Take 1 tablet by mouth daily.    . memantine (NAMENDA) 10 MG tablet Take 1 tablet (10 mg total) by mouth daily. PM 90 tablet 3  . Multiple Vitamin (THERA) TABS Take 1 tablet by mouth daily.     . Multiple Vitamins-Minerals (VISION-VITE PRESERVE PO) Take by mouth.    . saxagliptin HCl (ONGLYZA) 2.5 MG TABS tablet Take 2.5 mg by mouth daily.    . vitamin B-12 (CYANOCOBALAMIN) 500 MCG tablet Take 500 mcg by mouth daily. AM     No current facility-administered medications for this visit.     Neurologic: Headache: No Seizure: No Paresthesias:No  Musculoskeletal: Strength & Muscle Tone: within normal limits Gait & Station: normal Patient leans: N/A  Psychiatric Specialty Exam: Review of Systems  Constitutional: Positive for malaise/fatigue.  HENT: Positive for sore throat. Negative for congestion and tinnitus.   Eyes: Positive for photophobia.  Respiratory: Negative for hemoptysis.   Cardiovascular: Negative for palpitations.  Gastrointestinal: Negative for vomiting.  Genitourinary: Negative for urgency.  Musculoskeletal: Positive for joint pain. Negative for back pain and neck  pain.  Skin: Negative for rash.  Neurological: Positive for tingling.  Endo/Heme/Allergies: Positive for environmental allergies.  Psychiatric/Behavioral: Positive for depression. The patient is nervous/anxious.   All other systems reviewed and are negative.   Blood pressure (!) 155/65, pulse 69, temperature 98.1 F (36.7 C), temperature source Oral, weight 123 lb 6.4 oz (56 kg).Body mass index is 18.76 kg/m.  General Appearance: Fairly Groomed  Eye Contact:  Fair  Speech:  Clear and Coherent and Normal Rate  Volume:  Normal  Mood:  Euthymic  Affect:  Appropriate and Congruent  Thought Process:  Coherent and Goal Directed  Orientation:  Full (Time, Place, and Person)  Thought Content:  WDL  Suicidal Thoughts:  No  Homicidal Thoughts:  No  Memory:  Immediate;   Fair  Judgement:  Fair  Insight:  Fair  Psychomotor Activity:  Normal  Concentration:  Fair  Recall:  [employer] of Knowledge:Fair  Language: Fair  Akathisia:  No  Handed:  Right  AIMS (if indicated):    Assets:  Communication Skills Desire for Improvement Physical Health Social Support  ADL's:  Intact  Cognition: WNL  Sleep:      Treatment Plan Summary: Medication management   Continue meds as follows Discussed with patient about her medications treatment risk benefits and alternatives. Lexapro 20 mg daily She will continue on Namenda 10 mg daily.  She has enough supply of her medications.   She will follow-up in 2 month..     More than 50% of the time spent in psychoeducation, counseling and coordination of care.    This note was generated in part or whole with voice recognition software. Voice regonition is usually quite accurate but there are transcription errors that can and very often do occur. I apologize for any typographical errors that were not detected and corrected.    Rainey Pines, MD 1/13/202011:06 AM

## 2018-10-14 ENCOUNTER — Other Ambulatory Visit: Payer: Self-pay | Admitting: Internal Medicine

## 2018-10-14 DIAGNOSIS — Z1231 Encounter for screening mammogram for malignant neoplasm of breast: Secondary | ICD-10-CM

## 2018-10-20 ENCOUNTER — Ambulatory Visit
Admission: EM | Admit: 2018-10-20 | Discharge: 2018-10-20 | Disposition: A | Discharge status: Home / Self Care | Payer: Medicare Other | Attending: Emergency Medicine | Admitting: Emergency Medicine

## 2018-10-20 ENCOUNTER — Encounter: Payer: Self-pay | Admitting: Emergency Medicine

## 2018-10-20 ENCOUNTER — Ambulatory Visit (INDEPENDENT_AMBULATORY_CARE_PROVIDER_SITE_OTHER): Payer: Medicare Other

## 2018-10-20 ENCOUNTER — Other Ambulatory Visit: Payer: Self-pay

## 2018-10-20 DIAGNOSIS — R059 Cough, unspecified: Secondary | ICD-10-CM

## 2018-10-20 DIAGNOSIS — J069 Acute upper respiratory infection, unspecified: Secondary | ICD-10-CM

## 2018-10-20 DIAGNOSIS — R05 Cough: Secondary | ICD-10-CM | POA: Insufficient documentation

## 2018-10-20 DIAGNOSIS — J439 Emphysema, unspecified: Secondary | ICD-10-CM | POA: Diagnosis present

## 2018-10-20 HISTORY — DX: Emphysema, unspecified: J43.9

## 2018-10-20 LAB — RAPID STREP SCREEN (MED CTR MEBANE ONLY): Streptococcus, Group A Screen (Direct): NEGATIVE

## 2018-10-20 MED ORDER — FLUTICASONE PROPIONATE 50 MCG/ACT NA SUSP: 2.0000 | Freq: Every day | NASAL | 0 refills | Status: DC

## 2018-10-20 MED ORDER — AEROCHAMBER PLUS MISC: 2 refills | Status: DC

## 2018-10-20 MED ORDER — ALBUTEROL SULFATE HFA 108 (90 BASE) MCG/ACT IN AERS: 1.0000 | INHALATION_SPRAY | Freq: Four times a day (QID) | RESPIRATORY_TRACT | 0 refills | Status: AC | PRN

## 2018-10-20 MED ORDER — DOXYCYCLINE HYCLATE 100 MG PO CAPS: 100.0000 mg | ORAL_CAPSULE | Freq: Two times a day (BID) | ORAL | 0 refills | Status: AC

## 2018-10-20 NOTE — Discharge Instructions (Addendum)
Finish the doxycycline, even if you feel better. This Is an antibiotic to take care of a sinus infection.  Start some Flonase to help with the nasal congestion and runny nose.  500 mg of Tylenol 3 or 4 times a day.  Tessalon for the cough.  Saline nasal irrigation with a Milta Deiters med rinse and distilled water as often as you want.  You do not have a pneumonia or collapsed lung on your x-ray today.  I am sending you home with an albuterol inhaler with a spacer, 2 puffs every 4-6 hours as needed for cough.  It may help you breathe a little bit easier.  Follow-up with your doctor in several days especially if you are not getting any better, go to the ER for fevers above 100.4, worsening chest pain, shortness of breath, or for any other concerns.

## 2018-10-20 NOTE — ED Triage Notes (Signed)
Patient in today c/o cough and sore throat x 4 days and fever (99). Patient has tried OTC cough drops.
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
## 2018-10-23 LAB — CULTURE, GROUP A STREP (THRC)

## 2018-11-12 ENCOUNTER — Other Ambulatory Visit: Payer: Self-pay

## 2018-11-12 ENCOUNTER — Encounter: Payer: Self-pay | Admitting: Emergency Medicine

## 2018-11-12 ENCOUNTER — Emergency Department: Payer: Medicare Other

## 2018-11-12 DIAGNOSIS — J441 Chronic obstructive pulmonary disease with (acute) exacerbation: Secondary | ICD-10-CM | POA: Insufficient documentation

## 2018-11-12 DIAGNOSIS — I10 Essential (primary) hypertension: Secondary | ICD-10-CM | POA: Diagnosis not present

## 2018-11-12 DIAGNOSIS — J019 Acute sinusitis, unspecified: Secondary | ICD-10-CM | POA: Diagnosis not present

## 2018-11-12 DIAGNOSIS — E119 Type 2 diabetes mellitus without complications: Secondary | ICD-10-CM | POA: Insufficient documentation

## 2018-11-12 DIAGNOSIS — R05 Cough: Secondary | ICD-10-CM | POA: Diagnosis present

## 2018-11-12 MED ORDER — ALBUTEROL SULFATE (2.5 MG/3ML) 0.083% IN NEBU
5.0000 mg | INHALATION_SOLUTION | Freq: Once | RESPIRATORY_TRACT | Status: AC
  Administered 2018-11-12: 5 mg via RESPIRATORY_TRACT
  Filled 2018-11-12: qty 6

## 2018-11-12 NOTE — ED Triage Notes (Signed)
PT to ER with c/o increasing shortness of breath since last night.  PT states recent diagnosis of "getting COPD".  PT states cough and trouble catching her breath.  Has been using her ProAir without relief.  PT also c/o nasal congestion.

## 2018-11-13 ENCOUNTER — Emergency Department
Admission: EM | Admit: 2018-11-13 | Discharge: 2018-11-13 | Disposition: A | Discharge status: Home / Self Care | Payer: Medicare Other | Attending: Emergency Medicine | Admitting: Emergency Medicine

## 2018-11-13 DIAGNOSIS — J019 Acute sinusitis, unspecified: Secondary | ICD-10-CM

## 2018-11-13 DIAGNOSIS — J441 Chronic obstructive pulmonary disease with (acute) exacerbation: Secondary | ICD-10-CM

## 2018-11-13 LAB — COMPREHENSIVE METABOLIC PANEL
ALK PHOS: 63 U/L (ref 38–126)
ALT: 12 U/L (ref 0–44)
AST: 21 U/L (ref 15–41)
Albumin: 3.7 g/dL (ref 3.5–5.0)
Anion gap: 7 (ref 5–15)
BUN: 23 mg/dL (ref 8–23)
CALCIUM: 9 mg/dL (ref 8.9–10.3)
CO2: 29 mmol/L (ref 22–32)
CREATININE: 0.93 mg/dL (ref 0.44–1.00)
Chloride: 103 mmol/L (ref 98–111)
GFR calc non Af Amer: 57 mL/min — ABNORMAL LOW (ref >60)
Glucose, Bld: 125 mg/dL — ABNORMAL HIGH (ref 70–99)
Potassium: 4.2 mmol/L (ref 3.5–5.1)
SODIUM: 139 mmol/L (ref 135–145)
Total Bilirubin: 0.6 mg/dL (ref 0.3–1.2)
Total Protein: 6.6 g/dL (ref 6.5–8.1)

## 2018-11-13 LAB — CBC
HEMATOCRIT: 34.8 % — AB (ref 36.0–46.0)
Hemoglobin: 11 g/dL — ABNORMAL LOW (ref 12.0–15.0)
MCH: 30.5 pg (ref 26.0–34.0)
MCHC: 31.6 g/dL (ref 30.0–36.0)
MCV: 96.4 fL (ref 80.0–100.0)
NRBC: 0 % (ref 0.0–0.2)
Platelets: 256 10*3/uL (ref 150–400)
RBC: 3.61 MIL/uL — ABNORMAL LOW (ref 3.87–5.11)
RDW: 12.3 % (ref 11.5–15.5)
WBC: 6.3 10*3/uL (ref 4.0–10.5)

## 2018-11-13 MED ORDER — DOXYCYCLINE HYCLATE 100 MG PO CAPS: 100.0000 mg | ORAL_CAPSULE | Freq: Two times a day (BID) | ORAL | 0 refills | Status: AC

## 2018-11-13 MED ORDER — AMOXICILLIN-POT CLAVULANATE 875-125 MG PO TABS
1.0000 | ORAL_TABLET | Freq: Once | ORAL | Status: AC
  Administered 2018-11-13: 1 via ORAL
  Filled 2018-11-13: qty 1

## 2018-11-13 NOTE — ED Provider Notes (Signed)
[facility] Emergency Department Provider Note _____   First MD Initiated Contact with Patient 11/13/18 0128     (approximate)  I have reviewed the triage vital signs and the nursing notes.   HISTORY  Chief Complaint Cough and Shortness of Breath    HPI Lori Gilmore is a 85 y.o. female with below list of chronic medical conditions including recently diagnosed COPD presents to the emergency department with cough and "difficulty catching her breath".  Patient denies any fever afebrile on presentation.  Patient states that she has been using her pro-air at home as prescribed without any relief.  Patient also admits to nasal congestion.  She denies any chest pain.  Patient denies any lower extremity pain or swelling.    Past Medical History:  Diagnosis Date  . Anemia   . Arthritis    right knee  . Blood clots in brain   . Depression   . Diabetes mellitus without complication ([place])    Diet controlled  . Headache    sinus  . History of hiatal hernia   . Hypercholesteremia   . Hypertension   . Renal cancer ([place])    right kidney  . Seasonal allergies   . TIA (transient ischemic attack) 2000   no deficits  . Wears dentures    full upper and lower    Patient Active Problem List   Diagnosis Date Noted  . Personal history of renal cell carcinoma 11/22/2016  . Incomplete immunization status 04/08/2016  . DDD (degenerative disc disease), cervical 04/02/2016  . Major depressive disorder, recurrent episode, in full remission ([place]) 01/02/2016  . Osteoporosis, post-menopausal 12/10/2015  . Breast lump 11/19/2015  . Other long term (current) drug therapy 05/03/2015  . High risk medication use 05/03/2015  . Mild cognitive disorder 10/31/2014  . Hemorrhage of vagina 06/15/2014  . Severe episode of recurrent major depressive disorder ([place]) 05/08/2014  . History of urinary anomaly 04/30/2014  . Personal history of urinary infection 04/30/2014  .  History of recurrent UTIs 04/30/2014  . Anemia in chronic illness 04/29/2014  . Absolute anemia 04/29/2014  . Benign essential HTN 04/29/2014  . Chronic kidney disease (CKD), stage III (moderate) ([place]) 02/06/2014  . B12 deficiency 02/06/2014  . Anxiety and depression 02/06/2014  . Hypercholesterolemia 02/06/2014  . Mixed anxiety depressive disorder 02/06/2014  . Fall 02/21/2013  . Malaise and fatigue 02/21/2013  . Adynamia 02/21/2013  . Bladder pain 02/20/2013  . Difficult or painful urination 02/20/2013  . Frank hematuria 02/20/2013  . Labial cyst 02/20/2013  . Malignant neoplasm of kidney ([place]) 02/20/2013  . Urge incontinence of urine 02/20/2013  . Renal cell carcinoma ([place]) 02/20/2013  . Diabetes mellitus ([place]) 06/06/2010  . Benign hypertension 06/06/2010  . Polyneuropathy in diseases classified elsewhere ([place]) 06/06/2010  . Neuropathy 06/06/2010  . Mononeuritis 06/06/2010    Past Surgical History:  Procedure Laterality Date  . ABDOMINAL HYSTERECTOMY    . APPENDECTOMY    . BRAIN SURGERY     for blood clot  . BREAST BIOPSY Left 03/21/13   u/s bx-neg  . BREAST BIOPSY Left    neg  . BREAST CYST ASPIRATION Left    neg  . CATARACT EXTRACTION W/PHACO Right 02/13/2015   Procedure: CATARACT EXTRACTION PHACO AND INTRAOCULAR LENS PLACEMENT (IOC);  Surgeon: Leandrew Koyanagi, MD;  Location: [place];  Service: Ophthalmology;  Laterality: Right;  DIABETIC  . CATARACT EXTRACTION W/PHACO Left 03/20/2015   Procedure: CATARACT EXTRACTION PHACO AND  INTRAOCULAR LENS PLACEMENT (IOC);  Surgeon: Leandrew Koyanagi, MD;  Location: [place];  Service: Ophthalmology;  Laterality: Left;  . CHOLECYSTECTOMY      Prior to Admission medications   Medication Sig Start Date End Date Taking? Authorizing Provider  ACCU-CHEK SOFTCLIX LANCETS lancets  03/15/15   [provider]  albuterol (PROVENTIL HFA;VENTOLIN HFA) 108 (90 Base) MCG/ACT inhaler Inhale 1-2 puffs into the  lungs every 6 (six) hours as needed for wheezing or shortness of breath. Use with spacer 10/20/18   Melynda Ripple, MD  aspirin 81 MG tablet Take 81 mg by mouth daily. AM    [provider]  atorvastatin (LIPITOR) 10 MG tablet Take 10 mg by mouth daily. AM    [provider]  escitalopram (LEXAPRO) 20 MG tablet Take 1 tablet (20 mg total) by mouth daily. AM 08/08/18   Rainey Pines, MD  fluticasone (FLONASE) 50 MCG/ACT nasal spray Place 2 sprays into both nostrils daily. 10/20/18   Melynda Ripple, MD  gabapentin (NEURONTIN) 100 MG capsule Take by mouth. 04/12/18 04/12/19  [provider]  linagliptin (TRADJENTA) 5 MG TABS tablet Take by mouth. 09/02/18   [provider]  lisinopril-hydrochlorothiazide (PRINZIDE,ZESTORETIC) 20-12.5 MG tablet Take 1 tablet by mouth daily.    [provider]  memantine (NAMENDA) 10 MG tablet Take 1 tablet (10 mg total) by mouth daily. PM 08/08/18   Rainey Pines, MD  Multiple Vitamin (THERA) TABS Take 1 tablet by mouth daily.  05/11/14   [provider]  Multiple Vitamins-Minerals (VISION-VITE PRESERVE PO) Take by mouth.    [provider]  Spacer/Aero-Holding Chambers (AEROCHAMBER PLUS) inhaler Use as instructed 10/20/18   Melynda Ripple, MD    Allergies Erythromycin; Oxycodone-acetaminophen; Oxycodone-acetaminophen; Tapentadol; Amoxicillin; Ezetimibe; Glucosamine-chondroitin; Lovastatin; Omeprazole; Azithromycin; Erythromycin base; Heparin; and Other  Family History  Problem Relation Age of Onset  . Stroke Mother   . Hypertension Mother   . Hypertension Father   . Diabetes Daughter   . Breast cancer Neg Hx     Social History Social History   Tobacco Use  . Smoking status: Never Smoker  . Smokeless tobacco: Never Used  Substance Use Topics  . Alcohol use: No  . Drug use: No    Review of Systems Constitutional: No fever/chills Eyes: No visual changes. ENT: No sore throat.  Positive  for nasal congestion Cardiovascular: Denies chest pain. Respiratory: Positive for cough and dyspnea Gastrointestinal: No abdominal pain.  No nausea, no vomiting.  No diarrhea.  No constipation. Genitourinary: Negative for dysuria. Musculoskeletal: Negative for neck pain.  Negative for back pain. Integumentary: Negative for rash. Neurological: Negative for headaches, focal weakness or numbness.   ____________________________________________   PHYSICAL EXAM:  VITAL SIGNS: ED Triage Vitals [11/12/18 1849]  Enc Vitals Group     BP (!) 155/64     Pulse Rate 79     Resp (!) 22     Temp 97.9 F (36.6 C)     Temp src      SpO2 96 %     Weight 56.2 kg (124 lb)     Height 1.727 m (5\' 8" )     Head Circumference      Peak Flow      Pain Score 0     Pain Loc      Pain Edu?      Excl. in [place]?     Constitutional: Alert and oriented. Well appearing and in no acute distress. Eyes: Conjunctivae are normal.  Mouth/Throat: Mucous membranes are moist.  Oropharynx non-erythematous. Neck: No stridor.   Cardiovascular: Normal rate, regular rhythm. Good peripheral circulation. Grossly normal heart sounds. Respiratory: Normal respiratory effort.  No retractions.  Bibasilar wheezing Gastrointestinal: Soft and nontender. No distention.  Musculoskeletal: No lower extremity tenderness nor edema. No gross deformities of extremities. Neurologic:  Normal speech and language. No gross focal neurologic deficits are appreciated.  Skin:  Skin is warm, dry and intact. No rash noted. Psychiatric: Mood and affect are normal. Speech and behavior are normal.  ____________________________________________   LABS (all labs ordered are listed, but only abnormal results are displayed)  Labs Reviewed - No data to display ____________________________________________  EKG  ED ECG REPORT I, [place], the attending physician, personally viewed and interpreted this ECG.   Date: 11/12/2018  EKG Time:  6:59 PM  Rate: 72  Rhythm: Normal sinus rhythm  Axis: Normal  Intervals: Normal  ST&T Change: None  ____________________________________________  RADIOLOGY I, Early N BROWN, personally viewed and evaluated these images (plain radiographs) as part of my medical decision making, as well as reviewing the written report by the radiologist.  ED MD interpretation: COPD with hiatal hernia noted on chest x-ray  Official radiology report(s): Dg Chest 2 View  Result Date: 11/12/2018 CLINICAL DATA:  Shortness of Breath EXAM: CHEST - 2 VIEW COMPARISON:  10/20/2018 FINDINGS: Moderate-sized hiatal hernia. There is hyperinflation of the lungs compatible with COPD. Heart and mediastinal contours are within normal limits. No focal opacities or effusions. No acute bony abnormality. IMPRESSION: COPD.  Hiatal hernia.  No active disease. Electronically Signed   By: Rolm Baptise M.D.   On: 11/12/2018 19:29     Procedures   ____________________________________________   INITIAL IMPRESSION / MDM / ASSESSMENT AND PLAN / ED COURSE  As part of my medical decision making, I reviewed the following data within the [place] NUMBER   85 year old female presenting with above-stated history and physical exam consistent with COPD exacerbation for which patient was given duo nebs x2 with improvement of symptoms.  Laboratory data unremarkable chest x-ray consistent with COPD ____________________________________________  FINAL CLINICAL IMPRESSION(S) / ED DIAGNOSES  Final diagnoses:  COPD exacerbation ([place])  Acute sinusitis, recurrence not specified, unspecified location     MEDICATIONS GIVEN DURING THIS VISIT:  Medications  albuterol (PROVENTIL) (2.5 MG/3ML) 0.083% nebulizer solution 5 mg (5 mg Nebulization Given 11/12/18 1901)     ED Discharge Orders    None       Note:  This document was prepared using Dragon voice recognition software and may include unintentional dictation  errors.   Gregor Hams, MD 11/16/18 [phone]

## 2018-11-28 ENCOUNTER — Ambulatory Visit
Admission: RE | Admit: 2018-11-28 | Discharge: 2018-11-28 | Disposition: A | Discharge status: Home / Self Care | Payer: Medicare Other | Source: Ambulatory Visit | Attending: Internal Medicine | Admitting: Internal Medicine

## 2018-11-28 ENCOUNTER — Other Ambulatory Visit: Payer: Self-pay

## 2018-11-28 DIAGNOSIS — Z1231 Encounter for screening mammogram for malignant neoplasm of breast: Secondary | ICD-10-CM | POA: Diagnosis not present

## 2018-12-05 ENCOUNTER — Ambulatory Visit: Payer: Medicare Other | Admitting: Psychiatry

## 2018-12-19 ENCOUNTER — Ambulatory Visit (INDEPENDENT_AMBULATORY_CARE_PROVIDER_SITE_OTHER): Payer: Medicare Other | Admitting: Psychiatry

## 2018-12-19 ENCOUNTER — Other Ambulatory Visit: Payer: Self-pay

## 2018-12-19 ENCOUNTER — Encounter: Payer: Self-pay | Admitting: Psychiatry

## 2018-12-19 DIAGNOSIS — R4689 Other symptoms and signs involving appearance and behavior: Secondary | ICD-10-CM

## 2018-12-19 DIAGNOSIS — F331 Major depressive disorder, recurrent, moderate: Secondary | ICD-10-CM | POA: Diagnosis not present

## 2018-12-19 DIAGNOSIS — R4189 Other symptoms and signs involving cognitive functions and awareness: Secondary | ICD-10-CM

## 2018-12-19 MED ORDER — MEMANTINE HCL 10 MG PO TABS: 10.0000 mg | ORAL_TABLET | Freq: Every day | ORAL | 3 refills | Status: DC

## 2018-12-19 MED ORDER — ESCITALOPRAM OXALATE 20 MG PO TABS: 20.0000 mg | ORAL_TABLET | Freq: Every day | ORAL | 2 refills | Status: DC

## 2018-12-19 NOTE — Progress Notes (Signed)
Patient is 85 year old female with history of depression and age-related cognitive decline who was evaluated via telephone due to [place] pandemic. She reported that she is staying at home with her family. She does not go out unless he has to buy groceries. She appeared at her baseline as during the conversation she asked me "who are you". I had to remind her and then she recognized me and reported that she enjoys coming to my office. She reported that her daughter helps her with her medications.  No acute issues noted during the conversation as she reported that she is sleeping and eating well.  Plan. Continue medications as prescribed and referral medication that her local pharmacy.  I discussed the assessment and treatment plan with the patient. The patient was provided an opportunity to ask questions and all were answered. The patient agreed with the plan and demonstrated an understanding of the instructions.   The patient was advised to call back or seek an in-person evaluation if the symptoms worsen or if the condition fails to improve as anticipated.   I provided 10 minutes of non-face-to-face time during this encounter.

## 2019-01-16 ENCOUNTER — Emergency Department
Admission: EM | Admit: 2019-01-16 | Discharge: 2019-01-16 | Disposition: A | Discharge status: Home / Self Care | Payer: Medicare Other | Attending: Emergency Medicine | Admitting: Emergency Medicine

## 2019-01-16 ENCOUNTER — Other Ambulatory Visit: Payer: Self-pay

## 2019-01-16 ENCOUNTER — Emergency Department: Payer: Medicare Other

## 2019-01-16 ENCOUNTER — Encounter: Payer: Self-pay | Admitting: Emergency Medicine

## 2019-01-16 DIAGNOSIS — R6889 Other general symptoms and signs: Secondary | ICD-10-CM

## 2019-01-16 DIAGNOSIS — Z8673 Personal history of transient ischemic attack (TIA), and cerebral infarction without residual deficits: Secondary | ICD-10-CM | POA: Insufficient documentation

## 2019-01-16 DIAGNOSIS — I129 Hypertensive chronic kidney disease with stage 1 through stage 4 chronic kidney disease, or unspecified chronic kidney disease: Secondary | ICD-10-CM | POA: Diagnosis not present

## 2019-01-16 DIAGNOSIS — R6883 Chills (without fever): Secondary | ICD-10-CM | POA: Diagnosis present

## 2019-01-16 DIAGNOSIS — E1142 Type 2 diabetes mellitus with diabetic polyneuropathy: Secondary | ICD-10-CM | POA: Diagnosis not present

## 2019-01-16 DIAGNOSIS — E1122 Type 2 diabetes mellitus with diabetic chronic kidney disease: Secondary | ICD-10-CM | POA: Insufficient documentation

## 2019-01-16 DIAGNOSIS — E119 Type 2 diabetes mellitus without complications: Secondary | ICD-10-CM | POA: Insufficient documentation

## 2019-01-16 DIAGNOSIS — N183 Chronic kidney disease, stage 3 (moderate): Secondary | ICD-10-CM | POA: Diagnosis not present

## 2019-01-16 DIAGNOSIS — Z79899 Other long term (current) drug therapy: Secondary | ICD-10-CM | POA: Insufficient documentation

## 2019-01-16 DIAGNOSIS — R208 Other disturbances of skin sensation: Secondary | ICD-10-CM | POA: Insufficient documentation

## 2019-01-16 LAB — COMPREHENSIVE METABOLIC PANEL
ALT: 13 U/L (ref 0–44)
AST: 25 U/L (ref 15–41)
Albumin: 4 g/dL (ref 3.5–5.0)
Alkaline Phosphatase: 71 U/L (ref 38–126)
Anion gap: 10 (ref 5–15)
BUN: 21 mg/dL (ref 8–23)
CO2: 29 mmol/L (ref 22–32)
Calcium: 9.1 mg/dL (ref 8.9–10.3)
Chloride: 101 mmol/L (ref 98–111)
Creatinine, Ser: 0.92 mg/dL (ref 0.44–1.00)
GFR calc Af Amer: >60 mL/min (ref >60)
GFR calc non Af Amer: 58 mL/min — ABNORMAL LOW (ref >60)
Glucose, Bld: 96 mg/dL (ref 70–99)
Potassium: 4.1 mmol/L (ref 3.5–5.1)
Sodium: 140 mmol/L (ref 135–145)
Total Bilirubin: 1.1 mg/dL (ref 0.3–1.2)
Total Protein: 6.8 g/dL (ref 6.5–8.1)

## 2019-01-16 LAB — CBC WITH DIFFERENTIAL/PLATELET
Abs Immature Granulocytes: 0.01 10*3/uL (ref 0.00–0.07)
Basophils Absolute: 0 10*3/uL (ref 0.0–0.1)
Basophils Relative: 1 %
Eosinophils Absolute: 0.1 10*3/uL (ref 0.0–0.5)
Eosinophils Relative: 1 %
HCT: 34.9 % — ABNORMAL LOW (ref 36.0–46.0)
Hemoglobin: 11 g/dL — ABNORMAL LOW (ref 12.0–15.0)
Immature Granulocytes: 0 %
Lymphocytes Relative: 25 %
Lymphs Abs: 1.3 10*3/uL (ref 0.7–4.0)
MCH: 30.8 pg (ref 26.0–34.0)
MCHC: 31.5 g/dL (ref 30.0–36.0)
MCV: 97.8 fL (ref 80.0–100.0)
Monocytes Absolute: 0.6 10*3/uL (ref 0.1–1.0)
Monocytes Relative: 12 %
Neutro Abs: 3.2 10*3/uL (ref 1.7–7.7)
Neutrophils Relative %: 61 %
Platelets: 262 10*3/uL (ref 150–400)
RBC: 3.57 MIL/uL — ABNORMAL LOW (ref 3.87–5.11)
RDW: 12.6 % (ref 11.5–15.5)
WBC: 5.2 10*3/uL (ref 4.0–10.5)
nRBC: 0 % (ref 0.0–0.2)

## 2019-01-16 LAB — URINALYSIS, COMPLETE (UACMP) WITH MICROSCOPIC
Bilirubin Urine: NEGATIVE
Glucose, UA: NEGATIVE mg/dL
Hgb urine dipstick: NEGATIVE
Ketones, ur: NEGATIVE mg/dL
Leukocytes,Ua: NEGATIVE
Nitrite: NEGATIVE
Protein, ur: NEGATIVE mg/dL
Specific Gravity, Urine: 1.012 (ref 1.005–1.030)
pH: 7 (ref 5.0–8.0)

## 2019-01-16 LAB — VITAMIN B12: Vitamin B-12: 448 pg/mL (ref 180–914)

## 2019-01-16 LAB — TSH: TSH: 1.304 u[IU]/mL (ref 0.350–4.500)

## 2019-01-16 LAB — TROPONIN I: Troponin I: <0.03 ng/mL (ref <0.03)

## 2019-01-16 NOTE — ED Notes (Addendum)
Lori Gilmore , daughter [phone] called to receive update at this time. This RN gave update that we are still currently waiting on results and can check back again. Family verbalize d/c understanding

## 2019-01-16 NOTE — ED Notes (Signed)
Pt is c/o abd pain and generalized "coldness" - pt reports that she would like to see a doctor - messaged provider - gave pt coke, sandwich tray, and blankets

## 2019-01-16 NOTE — ED Provider Notes (Signed)
[facility] Emergency Department Provider Note ____________________________________________   First MD Initiated Contact with Patient 01/16/19 1231     (approximate)  I have reviewed the triage vital signs and the nursing notes.   HISTORY  Chief Complaint Chills and Shortness of Breath    HPI Lori Gilmore is a 85 y.o. female with PMH as noted below who presents with a primary complaint of feeling cold, acute onset during the night last time, and persisting since then.  The patient states that she feels like she is sitting in a cold bath water, but denies specific chills, rigors, or fever.  Patient also states that she feels somewhat generally weak.  She denies cough, shortness of breath, vomiting or diarrhea, or any specific pain anywhere.   Past Medical History:  Diagnosis Date  . Anemia   . Arthritis    right knee  . Blood clots in brain   . Depression   . Diabetes mellitus without complication ([place])    Diet controlled  . Headache    sinus  . History of hiatal hernia   . Hypercholesteremia   . Hypertension   . Renal cancer ([place])    right kidney  . Seasonal allergies   . TIA (transient ischemic attack) 2000   no deficits  . Wears dentures    full upper and lower    Patient Active Problem List   Diagnosis Date Noted  . Personal history of renal cell carcinoma 11/22/2016  . Incomplete immunization status 04/08/2016  . DDD (degenerative disc disease), cervical 04/02/2016  . Major depressive disorder, recurrent episode, in full remission ([place]) 01/02/2016  . Osteoporosis, post-menopausal 12/10/2015  . Breast lump 11/19/2015  . Other long term (current) drug therapy 05/03/2015  . High risk medication use 05/03/2015  . Mild cognitive disorder 10/31/2014  . Hemorrhage of vagina 06/15/2014  . Severe episode of recurrent major depressive disorder ([place]) 05/08/2014  . History of urinary anomaly 04/30/2014  . Personal history of urinary  infection 04/30/2014  . History of recurrent UTIs 04/30/2014  . Anemia in chronic illness 04/29/2014  . Absolute anemia 04/29/2014  . Benign essential HTN 04/29/2014  . Chronic kidney disease (CKD), stage III (moderate) ([place]) 02/06/2014  . B12 deficiency 02/06/2014  . Anxiety and depression 02/06/2014  . Hypercholesterolemia 02/06/2014  . Mixed anxiety depressive disorder 02/06/2014  . Fall 02/21/2013  . Malaise and fatigue 02/21/2013  . Adynamia 02/21/2013  . Bladder pain 02/20/2013  . Difficult or painful urination 02/20/2013  . Frank hematuria 02/20/2013  . Labial cyst 02/20/2013  . Malignant neoplasm of kidney ([place]) 02/20/2013  . Urge incontinence of urine 02/20/2013  . Renal cell carcinoma ([place]) 02/20/2013  . Diabetes mellitus ([place]) 06/06/2010  . Benign hypertension 06/06/2010  . Polyneuropathy in diseases classified elsewhere ([place]) 06/06/2010  . Neuropathy 06/06/2010  . Mononeuritis 06/06/2010    Past Surgical History:  Procedure Laterality Date  . ABDOMINAL HYSTERECTOMY    . APPENDECTOMY    . BRAIN SURGERY     for blood clot  . BREAST BIOPSY Left 03/21/13   u/s bx-neg  . BREAST BIOPSY Left    neg  . BREAST CYST ASPIRATION Left    neg  . CATARACT EXTRACTION W/PHACO Right 02/13/2015   Procedure: CATARACT EXTRACTION PHACO AND INTRAOCULAR LENS PLACEMENT (IOC);  Surgeon: Leandrew Koyanagi, MD;  Location: [place];  Service: Ophthalmology;  Laterality: Right;  DIABETIC  . CATARACT EXTRACTION W/PHACO Left 03/20/2015   Procedure: CATARACT EXTRACTION  PHACO AND INTRAOCULAR LENS PLACEMENT (IOC);  Surgeon: Leandrew Koyanagi, MD;  Location: [place];  Service: Ophthalmology;  Laterality: Left;  . CHOLECYSTECTOMY      Prior to Admission medications   Medication Sig Start Date End Date Taking? Authorizing Provider  ACCU-CHEK SOFTCLIX LANCETS lancets  03/15/15   [provider]  albuterol (PROVENTIL HFA;VENTOLIN HFA) 108 (90 Base) MCG/ACT inhaler  Inhale 1-2 puffs into the lungs every 6 (six) hours as needed for wheezing or shortness of breath. Use with spacer 10/20/18   Melynda Ripple, MD  aspirin 81 MG tablet Take 81 mg by mouth daily. AM    [provider]  atorvastatin (LIPITOR) 10 MG tablet Take 10 mg by mouth daily. AM    [provider]  escitalopram (LEXAPRO) 20 MG tablet Take 1 tablet (20 mg total) by mouth daily. AM 12/19/18   Rainey Pines, MD  fluticasone (FLONASE) 50 MCG/ACT nasal spray Place 2 sprays into both nostrils daily. 10/20/18   Melynda Ripple, MD  gabapentin (NEURONTIN) 100 MG capsule Take by mouth. 04/12/18 04/12/19  [provider]  linagliptin (TRADJENTA) 5 MG TABS tablet Take by mouth. 09/02/18   [provider]  lisinopril-hydrochlorothiazide (PRINZIDE,ZESTORETIC) 20-12.5 MG tablet Take 1 tablet by mouth daily.    [provider]  memantine (NAMENDA) 10 MG tablet Take 1 tablet (10 mg total) by mouth daily. PM 12/19/18   Rainey Pines, MD  Multiple Vitamin (THERA) TABS Take 1 tablet by mouth daily.  05/11/14   [provider]  Multiple Vitamins-Minerals (VISION-VITE PRESERVE PO) Take by mouth.    [provider]  Spacer/Aero-Holding Chambers (AEROCHAMBER PLUS) inhaler Use as instructed 10/20/18   Melynda Ripple, MD    Allergies Erythromycin; Oxycodone-acetaminophen; Oxycodone-acetaminophen; Tapentadol; Amoxicillin; Ezetimibe; Glucosamine-chondroitin; Lovastatin; Omeprazole; Azithromycin; Erythromycin base; Heparin; and Other  Family History  Problem Relation Age of Onset  . Stroke Mother   . Hypertension Mother   . Hypertension Father   . Diabetes Daughter   . Breast cancer Neg Hx     Social History Social History   Tobacco Use  . Smoking status: Never Smoker  . Smokeless tobacco: Never Used  Substance Use Topics  . Alcohol use: No  . Drug use: No    Review of Systems  Constitutional: No fever.  Positive for fatigue. Eyes: No  redness. ENT: No sore throat. Cardiovascular: Denies chest pain. Respiratory: Denies shortness of breath. Gastrointestinal: No vomiting or diarrhea. Genitourinary: Negative for dysuria.  Musculoskeletal: Negative for back pain. Skin: Negative for rash. Neurological: Negative for headache.   ____________________________________________   PHYSICAL EXAM:  VITAL SIGNS: ED Triage Vitals  Enc Vitals Group     BP 01/16/19 1219 (!) 153/64     Pulse Rate 01/16/19 1219 64     Resp 01/16/19 1219 (!) 24     Temp 01/16/19 1219 98.1 F (36.7 C)     Temp Source 01/16/19 1219 Oral     SpO2 01/16/19 1219 97 %     Weight 01/16/19 1221 122 lb (55.3 kg)     Height 01/16/19 1221 5\' 8"  (1.727 m)     Head Circumference --      Peak Flow --      Pain Score 01/16/19 1220 0     Pain Loc --      Pain Edu? --      Excl. in [place]? --     Constitutional: Alert and oriented.  Relatively well appearing and in no acute distress.  Eyes: Conjunctivae are normal.  Head: Atraumatic. Nose: No congestion/rhinnorhea. Mouth/Throat: Mucous membranes are slightly dry.   Neck: Normal range of motion.  Cardiovascular: Normal rate, regular rhythm. Grossly normal heart sounds.  Good peripheral circulation. Respiratory: Normal respiratory effort.  No retractions. Lungs CTAB. Gastrointestinal: No distention.  Musculoskeletal:  Extremities warm and well perfused.  Neurologic:  Normal speech and language. No gross focal neurologic deficits are appreciated.  Skin:  Skin is warm and dry. No rash noted. Psychiatric: Mood and affect are normal. Speech and behavior are normal.  ____________________________________________   LABS (all labs ordered are listed, but only abnormal results are displayed)  Labs Reviewed  CBC WITH DIFFERENTIAL/PLATELET - Abnormal; Notable for the following components:      Result Value   RBC 3.57 (*)    Hemoglobin 11.0 (*)    HCT 34.9 (*)    All other components within normal limits   COMPREHENSIVE METABOLIC PANEL - Abnormal; Notable for the following components:   GFR calc non Af Amer 58 (*)    All other components within normal limits  TROPONIN I  URINALYSIS, COMPLETE (UACMP) WITH MICROSCOPIC  TSH  VITAMIN B12   ____________________________________________  EKG  ED ECG REPORT I, Arta Silence, the attending physician, personally viewed and interpreted this ECG.  Date: 01/16/2019 EKG Time: 1222 Rate: 63 Rhythm: normal sinus rhythm QRS Axis: normal Intervals: LAFB ST/T Wave abnormalities: normal Narrative Interpretation: no evidence of acute ischemia  ____________________________________________  RADIOLOGY  CXR: No focal infiltrate or other acute abnormality  ____________________________________________   PROCEDURES  Procedure(s) performed: No  Procedures  Critical Care performed: No ____________________________________________   INITIAL IMPRESSION / ASSESSMENT AND PLAN / ED COURSE  Pertinent labs & imaging results that were available during my care of the patient were reviewed by me and considered in my medical decision making (see chart for details).  85 year old female with PMH as noted above including anemia and diabetes presents with a feeling of being cold, as well as with generalized weakness.  The onset was last night.  She denies any other focal symptoms.  On exam, she is overall relatively well-appearing for her age.  The vital signs are normal except for mild hypertension.  The patient is slightly anxious appearing.  Her mucous membranes are somewhat dry.  The remainder of the exam is unremarkable.  Differential includes anemia, dehydration, electrolyte abnormality, or possible chills related to infection.  We will obtain labs, chest x-ray and UA, and reassess.  ----------------------------------------- 3:18 PM on 01/16/2019 -----------------------------------------  Lab work-up so far is unremarkable.  I am still  awaiting a UA.  I added on TSH and B12 as well.  The patient has remained stable.  Plan will be discharged home if the rest of the work-up is negative.  I signed her out to the oncoming physician Dr. Joni Fears. ____________________________________________   FINAL CLINICAL IMPRESSION(S) / ED DIAGNOSES  Final diagnoses:  Sensation of feeling cold      NEW MEDICATIONS STARTED DURING THIS VISIT:  New Prescriptions   No medications on file     Note:  This document was prepared using Dragon voice recognition software and may include unintentional dictation errors.    Arta Silence, MD 01/16/19 [phone]

## 2019-01-16 NOTE — ED Notes (Addendum)
Pt states she feels like she is "sitting in a bath of cold water" and has felt this way all night- no fevers

## 2019-01-16 NOTE — ED Triage Notes (Signed)
States has been feeling chilled but cant state for how long. States has COPD but does feel more SOB than usual. States "I just feel bad."

## 2019-01-16 NOTE — ED Provider Notes (Signed)
-----------------------------------------   6:40 PM on 01/16/2019 -----------------------------------------  Vital signs still unremarkable, patient nontoxic-appearing, reviewed lab results which are all reassuring.  Recommended she follow-up with her primary care doctor this week for continued evaluation of her symptoms.   Carrie Mew, MD 01/16/19 1840

## 2019-01-16 NOTE — Discharge Instructions (Signed)
Your lab tests were all okay today. Please follow up with your doctor for continued evaluation of these symptoms.

## 2019-01-16 NOTE — ED Notes (Signed)
Pt daughter Nevin Bloodgood called to come and get patient

## 2019-01-16 NOTE — ED Notes (Signed)
Attempted IV x2. 

## 2019-01-16 NOTE — ED Notes (Signed)
Lab called and stated they did not have a red top for the vitamin B12 add-on order- will draw

## 2019-03-13 ENCOUNTER — Ambulatory Visit (INDEPENDENT_AMBULATORY_CARE_PROVIDER_SITE_OTHER): Payer: Medicare Other | Admitting: Psychiatry

## 2019-03-13 ENCOUNTER — Other Ambulatory Visit: Payer: Self-pay

## 2019-03-13 ENCOUNTER — Encounter: Payer: Self-pay | Admitting: Psychiatry

## 2019-03-13 DIAGNOSIS — R4189 Other symptoms and signs involving cognitive functions and awareness: Secondary | ICD-10-CM

## 2019-03-13 DIAGNOSIS — F331 Major depressive disorder, recurrent, moderate: Secondary | ICD-10-CM

## 2019-03-13 DIAGNOSIS — R4689 Other symptoms and signs involving appearance and behavior: Secondary | ICD-10-CM

## 2019-03-13 MED ORDER — GABAPENTIN 100 MG PO CAPS: 100.0000 mg | ORAL_CAPSULE | Freq: Every day | ORAL | 11 refills | Status: DC

## 2019-03-13 MED ORDER — MEMANTINE HCL 10 MG PO TABS: 10.0000 mg | ORAL_TABLET | Freq: Every day | ORAL | 3 refills | Status: AC

## 2019-03-13 MED ORDER — ESCITALOPRAM OXALATE 20 MG PO TABS: 20.0000 mg | ORAL_TABLET | Freq: Every day | ORAL | 2 refills | Status: AC

## 2019-03-13 NOTE — Progress Notes (Signed)
Patient ID: Lori Gilmore, female   DOB: 1934/06/28, 85 y.o.   MRN: [id number]   Patient is 85 year old female with history of anxiety and cognitive deficits who was sfollowed up for medication management. She reported that she has been living at home with her daughter and son-in-law. She stated that when her son-in-law is not working he is taking her to the church and she wears her mask. She reported that she sitting outside doing her puzzle book. She reported that she is compliant with her medications and is doing well. She currently denied having any suicidal ideations or plans. She was appreciative for her care and has been taking her medications as prescribed. She denied having any suicidal or homicidal ideations or plans. No acute symptoms noted at this time.  Plan I will refill her medications. Follow up in  four months earlier depending on her symptoms.  I discussed the assessment and treatment plan with the patient. The patient was provided an opportunity to ask questions and all were answered. The patient agreed with the plan and demonstrated an understanding of the instructions.   The patient was advised to call back or seek an in-person evaluation if the symptoms worsen or if the condition fails to improve as anticipated.   I provided 15 minutes of non-face-to-face time during this encounter.

## 2019-06-12 ENCOUNTER — Ambulatory Visit: Payer: Medicare Other | Admitting: Psychiatry

## 2019-06-12 ENCOUNTER — Other Ambulatory Visit: Payer: Self-pay

## 2020-10-16 ENCOUNTER — Emergency Department: Payer: Medicare Other

## 2020-10-16 ENCOUNTER — Other Ambulatory Visit: Payer: Self-pay

## 2020-10-16 ENCOUNTER — Encounter: Payer: Self-pay | Admitting: Emergency Medicine

## 2020-10-16 ENCOUNTER — Observation Stay
Admission: EM | Admit: 2020-10-16 | Discharge: 2020-10-18 | Disposition: A | Discharge status: Home / Self Care | Payer: Medicare Other | Attending: Internal Medicine | Admitting: Internal Medicine

## 2020-10-16 DIAGNOSIS — E0822 Diabetes mellitus due to underlying condition with diabetic chronic kidney disease: Secondary | ICD-10-CM | POA: Diagnosis not present

## 2020-10-16 DIAGNOSIS — I10 Essential (primary) hypertension: Secondary | ICD-10-CM | POA: Diagnosis not present

## 2020-10-16 DIAGNOSIS — F3342 Major depressive disorder, recurrent, in full remission: Secondary | ICD-10-CM | POA: Diagnosis present

## 2020-10-16 DIAGNOSIS — Z85528 Personal history of other malignant neoplasm of kidney: Secondary | ICD-10-CM | POA: Diagnosis not present

## 2020-10-16 DIAGNOSIS — F09 Unspecified mental disorder due to known physiological condition: Secondary | ICD-10-CM

## 2020-10-16 DIAGNOSIS — E1142 Type 2 diabetes mellitus with diabetic polyneuropathy: Secondary | ICD-10-CM | POA: Diagnosis not present

## 2020-10-16 DIAGNOSIS — E119 Type 2 diabetes mellitus without complications: Secondary | ICD-10-CM

## 2020-10-16 DIAGNOSIS — R531 Weakness: Secondary | ICD-10-CM | POA: Diagnosis not present

## 2020-10-16 DIAGNOSIS — N390 Urinary tract infection, site not specified: Secondary | ICD-10-CM | POA: Diagnosis not present

## 2020-10-16 DIAGNOSIS — R2681 Unsteadiness on feet: Secondary | ICD-10-CM | POA: Diagnosis not present

## 2020-10-16 DIAGNOSIS — Z7982 Long term (current) use of aspirin: Secondary | ICD-10-CM | POA: Diagnosis not present

## 2020-10-16 DIAGNOSIS — N183 Chronic kidney disease, stage 3 unspecified: Secondary | ICD-10-CM | POA: Diagnosis not present

## 2020-10-16 DIAGNOSIS — I129 Hypertensive chronic kidney disease with stage 1 through stage 4 chronic kidney disease, or unspecified chronic kidney disease: Secondary | ICD-10-CM | POA: Insufficient documentation

## 2020-10-16 DIAGNOSIS — N1831 Chronic kidney disease, stage 3a: Secondary | ICD-10-CM

## 2020-10-16 DIAGNOSIS — Z20822 Contact with and (suspected) exposure to covid-19: Secondary | ICD-10-CM | POA: Insufficient documentation

## 2020-10-16 DIAGNOSIS — N1832 Chronic kidney disease, stage 3b: Secondary | ICD-10-CM | POA: Diagnosis present

## 2020-10-16 LAB — CBC
HCT: 34.1 % — ABNORMAL LOW (ref 36.0–46.0)
Hemoglobin: 10.6 g/dL — ABNORMAL LOW (ref 12.0–15.0)
MCH: 29.6 pg (ref 26.0–34.0)
MCHC: 31.1 g/dL (ref 30.0–36.0)
MCV: 95.3 fL (ref 80.0–100.0)
Platelets: 314 10*3/uL (ref 150–400)
RBC: 3.58 MIL/uL — ABNORMAL LOW (ref 3.87–5.11)
RDW: 13.4 % (ref 11.5–15.5)
WBC: 5.9 10*3/uL (ref 4.0–10.5)
nRBC: 0 % (ref 0.0–0.2)

## 2020-10-16 LAB — URINALYSIS, COMPLETE (UACMP) WITH MICROSCOPIC
Bilirubin Urine: NEGATIVE
Glucose, UA: NEGATIVE mg/dL
Hgb urine dipstick: NEGATIVE
Ketones, ur: NEGATIVE mg/dL
Nitrite: NEGATIVE
Protein, ur: NEGATIVE mg/dL
Specific Gravity, Urine: 1.016 (ref 1.005–1.030)
pH: 5 (ref 5.0–8.0)

## 2020-10-16 LAB — BASIC METABOLIC PANEL
Anion gap: 12 (ref 5–15)
BUN: 30 mg/dL — ABNORMAL HIGH (ref 8–23)
CO2: 27 mmol/L (ref 22–32)
Calcium: 9.2 mg/dL (ref 8.9–10.3)
Chloride: 102 mmol/L (ref 98–111)
Creatinine, Ser: 1.07 mg/dL — ABNORMAL HIGH (ref 0.44–1.00)
GFR, Estimated: 51 mL/min — ABNORMAL LOW (ref >60)
Glucose, Bld: 134 mg/dL — ABNORMAL HIGH (ref 70–99)
Potassium: 4.7 mmol/L (ref 3.5–5.1)
Sodium: 141 mmol/L (ref 135–145)

## 2020-10-16 LAB — POC SARS CORONAVIRUS 2 AG -  ED: SARS Coronavirus 2 Ag: NEGATIVE

## 2020-10-16 LAB — CBG MONITORING, ED: Glucose-Capillary: 136 mg/dL — ABNORMAL HIGH (ref 70–99)

## 2020-10-16 LAB — SARS CORONAVIRUS 2 BY RT PCR (HOSPITAL ORDER, PERFORMED IN ~~LOC~~ HOSPITAL LAB): SARS Coronavirus 2: NEGATIVE

## 2020-10-16 MED ORDER — SODIUM CHLORIDE 0.9 % IV SOLN
1.0000 g | Freq: Once | INTRAVENOUS | Status: AC
  Administered 2020-10-17: 1 g via INTRAVENOUS
  Filled 2020-10-16: qty 10

## 2020-10-16 MED ORDER — LACTATED RINGERS IV BOLUS
1000.0000 mL | Freq: Once | INTRAVENOUS | Status: AC
  Administered 2020-10-16: 1000 mL via INTRAVENOUS

## 2020-10-16 NOTE — ED Notes (Signed)
Patient returned to room. 

## 2020-10-16 NOTE — ED Provider Notes (Signed)
[facility] Emergency Department Provider Note   ____________________________________________   Event Date/Time   First MD Initiated Contact with Patient 10/16/20 1947     (approximate)  I have reviewed the triage vital signs and the nursing notes.   HISTORY  Chief Complaint Weakness    HPI Lori Gilmore is a 87 y.o. female with past medical history of hypertension, hyperlipidemia, and diabetes who presents to the ED complaining of generalized weakness.  Patient reports that since she woke up this morning she has been feeling much weaker than usual.  She typically gets around with the use of a cane but states today her legs have been giving out on her and she has been having difficulty walking.  She denies any areas of pain and has not had any falls.  She denies any focal weakness or numbness.  She denies any fevers, cough, chest pain, shortness of breath, dysuria, or hematuria.  She has not had any sick contacts and is fully vaccinated against COVID-19.        Past Medical History:  Diagnosis Date  . Anemia   . Arthritis    right knee  . Blood clots in brain   . Depression   . Diabetes mellitus without complication ([place])    Diet controlled  . Headache    sinus  . History of hiatal hernia   . Hypercholesteremia   . Hypertension   . Renal cancer ([place])    right kidney  . Seasonal allergies   . TIA (transient ischemic attack) 2000   no deficits  . Wears dentures    full upper and lower    Patient Active Problem List   Diagnosis Date Noted  . Personal history of renal cell carcinoma 11/22/2016  . Incomplete immunization status 04/08/2016  . DDD (degenerative disc disease), cervical 04/02/2016  . Major depressive disorder, recurrent episode, in full remission ([place]) 01/02/2016  . Osteoporosis, post-menopausal 12/10/2015  . Breast lump 11/19/2015  . Other long term (current) drug therapy 05/03/2015  . High risk medication use 05/03/2015   . Mild cognitive disorder 10/31/2014  . Hemorrhage of vagina 06/15/2014  . Severe episode of recurrent major depressive disorder ([place]) 05/08/2014  . History of urinary anomaly 04/30/2014  . Personal history of urinary infection 04/30/2014  . History of recurrent UTIs 04/30/2014  . Anemia in chronic illness 04/29/2014  . Absolute anemia 04/29/2014  . Benign essential HTN 04/29/2014  . Chronic kidney disease (CKD), stage III (moderate) ([place]) 02/06/2014  . B12 deficiency 02/06/2014  . Anxiety and depression 02/06/2014  . Hypercholesterolemia 02/06/2014  . Mixed anxiety depressive disorder 02/06/2014  . Fall 02/21/2013  . Malaise and fatigue 02/21/2013  . Adynamia 02/21/2013  . Bladder pain 02/20/2013  . Difficult or painful urination 02/20/2013  . Frank hematuria 02/20/2013  . Labial cyst 02/20/2013  . Malignant neoplasm of kidney ([place]) 02/20/2013  . Urge incontinence of urine 02/20/2013  . Renal cell carcinoma ([place]) 02/20/2013  . Diabetes mellitus ([place]) 06/06/2010  . Benign hypertension 06/06/2010  . Polyneuropathy in diseases classified elsewhere ([place]) 06/06/2010  . Neuropathy 06/06/2010  . Mononeuritis 06/06/2010    Past Surgical History:  Procedure Laterality Date  . ABDOMINAL HYSTERECTOMY    . APPENDECTOMY    . BRAIN SURGERY     for blood clot  . BREAST BIOPSY Left 03/21/13   u/s bx-neg  . BREAST BIOPSY Left    neg  . BREAST CYST ASPIRATION Left    neg  .  CATARACT EXTRACTION W/PHACO Right 02/13/2015   Procedure: CATARACT EXTRACTION PHACO AND INTRAOCULAR LENS PLACEMENT (IOC);  Surgeon: Leandrew Koyanagi, MD;  Location: [place];  Service: Ophthalmology;  Laterality: Right;  DIABETIC  . CATARACT EXTRACTION W/PHACO Left 03/20/2015   Procedure: CATARACT EXTRACTION PHACO AND INTRAOCULAR LENS PLACEMENT (IOC);  Surgeon: Leandrew Koyanagi, MD;  Location: [place];  Service: Ophthalmology;  Laterality: Left;  . CHOLECYSTECTOMY      Prior to Admission  medications   Medication Sig Start Date End Date Taking? Authorizing Provider  ACCU-CHEK SOFTCLIX LANCETS lancets  03/15/15   [provider]  albuterol (PROVENTIL HFA;VENTOLIN HFA) 108 (90 Base) MCG/ACT inhaler Inhale 1-2 puffs into the lungs every 6 (six) hours as needed for wheezing or shortness of breath. Use with spacer 10/20/18   Melynda Ripple, MD  aspirin 81 MG tablet Take 81 mg by mouth daily. AM    [provider]  atorvastatin (LIPITOR) 10 MG tablet Take 10 mg by mouth daily. AM    [provider]  escitalopram (LEXAPRO) 20 MG tablet Take 1 tablet (20 mg total) by mouth daily. AM 03/13/19   Rainey Pines, MD  fluticasone (FLONASE) 50 MCG/ACT nasal spray Place 2 sprays into both nostrils daily. 10/20/18   Melynda Ripple, MD  gabapentin (NEURONTIN) 100 MG capsule Take 1 capsule (100 mg total) by mouth at bedtime. 03/13/19 03/12/20  Rainey Pines, MD  linagliptin (TRADJENTA) 5 MG TABS tablet Take by mouth. 09/02/18   [provider]  lisinopril-hydrochlorothiazide (PRINZIDE,ZESTORETIC) 20-12.5 MG tablet Take 1 tablet by mouth daily.    [provider]  memantine (NAMENDA) 10 MG tablet Take 1 tablet (10 mg total) by mouth daily. PM 03/13/19   Rainey Pines, MD  Multiple Vitamin (THERA) TABS Take 1 tablet by mouth daily.  05/11/14   [provider]  Multiple Vitamins-Minerals (VISION-VITE PRESERVE PO) Take by mouth.    [provider]  Spacer/Aero-Holding Chambers (AEROCHAMBER PLUS) inhaler Use as instructed 10/20/18   Melynda Ripple, MD    Allergies Erythromycin, Oxycodone-acetaminophen, Oxycodone-acetaminophen, Tapentadol, Amoxicillin, Ezetimibe, Glucosamine-chondroitin, Lovastatin, Omeprazole, Azithromycin, Erythromycin base, Heparin, and Other  Family History  Problem Relation Age of Onset  . Stroke Mother   . Hypertension Mother   . Hypertension Father   . Diabetes Daughter   . Breast cancer Neg Hx     Social  History Social History   Tobacco Use  . Smoking status: Never Smoker  . Smokeless tobacco: Never Used  Vaping Use  . Vaping Use: Never used  Substance Use Topics  . Alcohol use: No  . Drug use: No    Review of Systems  Constitutional: No fever/chills.  Positive for generalized weakness. Eyes: No visual changes. ENT: No sore throat. Cardiovascular: Denies chest pain. Respiratory: Denies shortness of breath. Gastrointestinal: No abdominal pain.  No nausea, no vomiting.  No diarrhea.  No constipation. Genitourinary: Negative for dysuria. Musculoskeletal: Negative for back pain. Skin: Negative for rash. Neurological: Negative for headaches, focal weakness or numbness.  ____________________________________________   PHYSICAL EXAM:  VITAL SIGNS: ED Triage Vitals  Enc Vitals Group     BP 10/16/20 1145 (!) 141/53     Pulse Rate 10/16/20 1145 69     Resp 10/16/20 1145 20     Temp 10/16/20 1145 98.7 F (37.1 C)     Temp Source 10/16/20 1145 Oral     SpO2 10/16/20 1145 94 %     Weight 10/16/20 1146 141 lb (64 kg)  Height 10/16/20 1146 5\' 8"  (1.727 m)     Head Circumference --      Peak Flow --      Pain Score 10/16/20 1146 0     Pain Loc --      Pain Edu? --      Excl. in [place]? --     Constitutional: Alert and oriented. Eyes: Conjunctivae are normal. Head: Atraumatic. Nose: No congestion/rhinnorhea. Mouth/Throat: Mucous membranes are moist. Neck: Normal ROM Cardiovascular: Normal rate, regular rhythm. Grossly normal heart sounds.  2+ radial pulses bilaterally. Respiratory: Normal respiratory effort.  No retractions. Lungs CTAB. Gastrointestinal: Soft and nontender. No distention. Genitourinary: deferred Musculoskeletal: No lower extremity tenderness nor edema. Neurologic:  Normal speech and language. No gross focal neurologic deficits are appreciated. Skin:  Skin is warm, dry and intact. No rash noted. Psychiatric: Mood and affect are normal. Speech and behavior  are normal.  ____________________________________________   LABS (all labs ordered are listed, but only abnormal results are displayed)  Labs Reviewed  BASIC METABOLIC PANEL - Abnormal; Notable for the following components:      Result Value   Glucose, Bld 134 (*)    BUN 30 (*)    Creatinine, Ser 1.07 (*)    GFR, Estimated 51 (*)    All other components within normal limits  CBC - Abnormal; Notable for the following components:   RBC 3.58 (*)    Hemoglobin 10.6 (*)    HCT 34.1 (*)    All other components within normal limits  URINALYSIS, COMPLETE (UACMP) WITH MICROSCOPIC - Abnormal; Notable for the following components:   Color, Urine YELLOW (*)    APPearance HAZY (*)    Leukocytes,Ua SMALL (*)    Bacteria, UA RARE (*)    All other components within normal limits  CBG MONITORING, ED - Abnormal; Notable for the following components:   Glucose-Capillary 136 (*)    All other components within normal limits  SARS CORONAVIRUS 2 BY RT PCR (HOSPITAL ORDER, [facility])  URINE CULTURE  POC SARS CORONAVIRUS 2 AG -  ED  TROPONIN I (HIGH SENSITIVITY)   ____________________________________________  EKG  ED ECG REPORT I, Blake Divine, the attending physician, personally viewed and interpreted this ECG.   Date: 10/16/2020  EKG Time: 11:53  Rate: 63  Rhythm: normal sinus rhythm, sinus arrhythmia  Axis: LAD  Intervals:right bundle branch block and left anterior fascicular block  ST&T Change: None   PROCEDURES  Procedure(s) performed (including Critical Care):  Procedures   ____________________________________________   INITIAL IMPRESSION / ASSESSMENT AND PLAN / ED COURSE       87 year old female with past medical history of hypertension, hyperlipidemia, and diabetes who presents to the ED for generalized weakness since waking up this morning.  She has no focal neurologic deficits on exam but appears globally weak, especially slow in both  lower extremities.  I doubt stroke and CT head is not indicated at this time.  Lab work thus far shows very mild AKI but is otherwise reassuring.  We will check testing for COVID-19 and UA, hydrate with IV fluids.  UA shows possible infection, we will send for culture and treat with Rocephin. Covid testing is negative. CT head negative for acute process, chest x-ray reviewed by me and shows no infiltrate, edema, or effusion. Given her ongoing global weakness and difficulty ambulating, case discussed with hospitalist for admission. We will also add on troponin given new right bundle branch block.  ____________________________________________   FINAL CLINICAL IMPRESSION(S) / ED DIAGNOSES  Final diagnoses:  Generalized weakness  Lower urinary tract infectious disease     ED Discharge Orders    None       Note:  This document was prepared using Dragon voice recognition software and may include unintentional dictation errors.   Blake Divine, MD 10/16/20 [phone]

## 2020-10-16 NOTE — ED Triage Notes (Signed)
Pt to ED via [facility] with c/o generalized weakness. Per EMS pt is from [facility], was up and moving then went and laid down in her bed.   199/ 90 CBG 325  Stroke screen negative RBB

## 2020-10-16 NOTE — ED Notes (Signed)
Patient transported to CT 

## 2020-10-16 NOTE — ED Triage Notes (Signed)
Pt here via EMS from [facility] with c/o generalized weakness. Pt denies any pain, is alert and oriented, daughter in triage states her mom is acting weak. NAD.

## 2020-10-17 ENCOUNTER — Other Ambulatory Visit: Payer: Self-pay

## 2020-10-17 DIAGNOSIS — R531 Weakness: Secondary | ICD-10-CM | POA: Diagnosis not present

## 2020-10-17 LAB — BASIC METABOLIC PANEL
Anion gap: 6 (ref 5–15)
BUN: 26 mg/dL — ABNORMAL HIGH (ref 8–23)
CO2: 27 mmol/L (ref 22–32)
Calcium: 8.3 mg/dL — ABNORMAL LOW (ref 8.9–10.3)
Chloride: 105 mmol/L (ref 98–111)
Creatinine, Ser: 0.91 mg/dL (ref 0.44–1.00)
GFR, Estimated: >60 mL/min (ref >60)
Glucose, Bld: 110 mg/dL — ABNORMAL HIGH (ref 70–99)
Potassium: 4.1 mmol/L (ref 3.5–5.1)
Sodium: 138 mmol/L (ref 135–145)

## 2020-10-17 LAB — CBC
HCT: 29.5 % — ABNORMAL LOW (ref 36.0–46.0)
Hemoglobin: 9.3 g/dL — ABNORMAL LOW (ref 12.0–15.0)
MCH: 29.7 pg (ref 26.0–34.0)
MCHC: 31.5 g/dL (ref 30.0–36.0)
MCV: 94.2 fL (ref 80.0–100.0)
Platelets: 257 10*3/uL (ref 150–400)
RBC: 3.13 MIL/uL — ABNORMAL LOW (ref 3.87–5.11)
RDW: 13.5 % (ref 11.5–15.5)
WBC: 5.3 10*3/uL (ref 4.0–10.5)
nRBC: 0 % (ref 0.0–0.2)

## 2020-10-17 LAB — MRSA PCR SCREENING: MRSA by PCR: NEGATIVE

## 2020-10-17 LAB — TSH: TSH: 1.364 u[IU]/mL (ref 0.350–4.500)

## 2020-10-17 LAB — VITAMIN B12: Vitamin B-12: 701 pg/mL (ref 180–914)

## 2020-10-17 LAB — TROPONIN I (HIGH SENSITIVITY): Troponin I (High Sensitivity): 6 ng/L (ref <18)

## 2020-10-17 MED ORDER — VITAMIN B-12 1000 MCG PO TABS
500.0000 ug | ORAL_TABLET | Freq: Every day | ORAL | Status: DC
  Administered 2020-10-18: 500 ug via ORAL
  Filled 2020-10-17: qty 1

## 2020-10-17 MED ORDER — LINAGLIPTIN 5 MG PO TABS
5.0000 mg | ORAL_TABLET | Freq: Every day | ORAL | Status: DC
  Administered 2020-10-17 – 2020-10-18 (×2): 5 mg via ORAL
  Filled 2020-10-17 (×2): qty 1

## 2020-10-17 MED ORDER — MEMANTINE HCL 5 MG PO TABS
10.0000 mg | ORAL_TABLET | Freq: Every evening | ORAL | Status: DC
  Administered 2020-10-17: 10 mg via ORAL
  Filled 2020-10-17: qty 2

## 2020-10-17 MED ORDER — SODIUM CHLORIDE 0.9 % IV SOLN
1.0000 g | INTRAVENOUS | Status: DC
  Administered 2020-10-17: 1 g via INTRAVENOUS
  Filled 2020-10-17 (×2): qty 10

## 2020-10-17 MED ORDER — ASPIRIN EC 81 MG PO TBEC
81.0000 mg | DELAYED_RELEASE_TABLET | Freq: Every day | ORAL | Status: DC
  Administered 2020-10-17 – 2020-10-18 (×2): 81 mg via ORAL
  Filled 2020-10-17 (×3): qty 1

## 2020-10-17 MED ORDER — ESCITALOPRAM OXALATE 10 MG PO TABS
20.0000 mg | ORAL_TABLET | Freq: Every day | ORAL | Status: DC
  Administered 2020-10-17 – 2020-10-18 (×2): 20 mg via ORAL
  Filled 2020-10-17 (×2): qty 2

## 2020-10-17 MED ORDER — HYDROCHLOROTHIAZIDE 12.5 MG PO CAPS
12.5000 mg | ORAL_CAPSULE | Freq: Every day | ORAL | Status: DC
  Administered 2020-10-17 – 2020-10-18 (×2): 12.5 mg via ORAL
  Filled 2020-10-17 (×2): qty 1

## 2020-10-17 MED ORDER — GABAPENTIN 100 MG PO CAPS
100.0000 mg | ORAL_CAPSULE | Freq: Every day | ORAL | Status: DC
  Administered 2020-10-17 (×2): 100 mg via ORAL
  Filled 2020-10-17 (×2): qty 1

## 2020-10-17 MED ORDER — LISINOPRIL-HYDROCHLOROTHIAZIDE 20-12.5 MG PO TABS: 1.0000 | ORAL_TABLET | Freq: Every day | ORAL | Status: DC

## 2020-10-17 MED ORDER — LISINOPRIL 20 MG PO TABS
20.0000 mg | ORAL_TABLET | Freq: Every day | ORAL | Status: DC
  Administered 2020-10-17 – 2020-10-18 (×2): 20 mg via ORAL
  Filled 2020-10-17 (×2): qty 1

## 2020-10-17 MED ORDER — ALBUTEROL SULFATE HFA 108 (90 BASE) MCG/ACT IN AERS
2.0000 | INHALATION_SPRAY | RESPIRATORY_TRACT | Status: DC | PRN
  Administered 2020-10-17: 2 via RESPIRATORY_TRACT
  Filled 2020-10-17: qty 6.7

## 2020-10-17 MED ORDER — LACTATED RINGERS IV SOLN: INTRAVENOUS | Status: AC

## 2020-10-17 NOTE — ED Notes (Signed)
Received message room is clean and ready for pt per RN Stacie Glaze.

## 2020-10-17 NOTE — ED Notes (Signed)
Patient repositioned, warm blankets given, pillow provided and lights dimmed for comfort.

## 2020-10-17 NOTE — ED Notes (Signed)
OT at bedside. 

## 2020-10-17 NOTE — H&P (Signed)
History and Physical    Lori Gilmore DXA:[id number] DOB: [birth year] DOA: 10/16/2020  PCP: Lori Kayser, MD  Patient coming from: [facility] assisted [facility] facility  I have personally briefly reviewed patient's old medical records in [place]  Chief Complaint: Weakness  HPI: Lori Gilmore is a 87 y.o. female with medical history significant for cognitive impairment, CKD stage III, type 2 diabetes and hypertension who presents from her assisted living with concerns of weakness.  Daughter is at bedside. Patient reports that after eating breakfast today she felt like she could not "feel anything her body."  She feels weak and had trouble with mobility.  She has chronic peripheral neuropathy of her feet due to diabetes but today feels like her numbness is up higher on her legs.  She had a fall about several weeks ago but recovered without any issues.  Denies any trauma to her back or any history of back surgery. No other recent surgeries or procedures. Denies any incontinence.  She denies any headaches, dizziness or blurry vision.  Denies any chest pain or shortness of breath.  Has had a runny nose for the past week but denies any fever or cough.  No known sick contact.  Denies any nausea, vomiting or diarrhea.  No dysuria, increased urgency or frequency.  No new medication changes. Currently she is feeling generalized malaise and is complaining of feeling "very cold."  ED Course: She is afebrile with a temperature of 98.1, normotensive with otherwise normal vital signs on room air.  BC shows no leukocytosis and she has stable anemia with hemoglobin of 10.6 with a usual baseline of 11.  No significant electrolyte derangements noted except for mild AKI with creatinine of 1.07 from a prior of 0.92. Her urine shows small leukocyte, negative nitrite and rare bacteria.  CT head performed was negative.  Chest x-ray negative.  Negative Covid antigen test.  EKG shows a new right bundle  branch block in addition to her left bundle branch block seen in prior EKG.  No troponin was obtained prior to admission  Patient was started on Rocephin by ED physician Dr. Charna Archer for presumed UTI.  Review of Systems: Constitutional: No Weight Change, No Fever ENT/Mouth: No sore throat, No Rhinorrhea Eyes: No Eye Pain, No Vision Changes Cardiovascular: No Chest Pain, no SOB, No PND, No Dyspnea on Exertion, No Orthopnea, No Edema, No Palpitations Respiratory: No Cough, No Sputum, No Wheezing, no Dyspnea  Gastrointestinal: No Nausea, No Vomiting, No Diarrhea, No Constipation, No Pain Genitourinary: no Urinary Incontinence, No Urgency, No Flank Pain Musculoskeletal: No Arthralgias, No Myalgias Skin: No Skin Lesions, No Pruritus, Neuro: + Weakness, No Numbness,  No Loss of Consciousness, No Syncope Psych: No Anxiety/Panic, No Depression, no decrease appetite Heme/Lymph: No Bruising, No Bleeding  Past Medical History:  Diagnosis Date  . Anemia   . Arthritis    right knee  . Blood clots in brain   . Depression   . Diabetes mellitus without complication ([place])    Diet controlled  . Headache    sinus  . History of hiatal hernia   . Hypercholesteremia   . Hypertension   . Renal cancer ([place])    right kidney  . Seasonal allergies   . TIA (transient ischemic attack) 2000   no deficits  . Wears dentures    full upper and lower    Past Surgical History:  Procedure Laterality Date  . ABDOMINAL HYSTERECTOMY    . APPENDECTOMY    .  BRAIN SURGERY     for blood clot  . BREAST BIOPSY Left 03/21/13   u/s bx-neg  . BREAST BIOPSY Left    neg  . BREAST CYST ASPIRATION Left    neg  . CATARACT EXTRACTION W/PHACO Right 02/13/2015   Procedure: CATARACT EXTRACTION PHACO AND INTRAOCULAR LENS PLACEMENT (IOC);  Surgeon: Leandrew Koyanagi, MD;  Location: [facility];  Service: Ophthalmology;  Laterality: Right;  DIABETIC  . CATARACT EXTRACTION W/PHACO Left 03/20/2015   Procedure: CATARACT  EXTRACTION PHACO AND INTRAOCULAR LENS PLACEMENT (IOC);  Surgeon: Leandrew Koyanagi, MD;  Location: [facility];  Service: Ophthalmology;  Laterality: Left;  . CHOLECYSTECTOMY       reports that she has never smoked. She has never used smokeless tobacco. She reports that she does not drink alcohol and does not use drugs. Social History  Allergies  Allergen Reactions  . Erythromycin Other (See Comments)  . Oxycodone-Acetaminophen Nausea And Vomiting  . Oxycodone-Acetaminophen Nausea And Vomiting  . Tapentadol Other (See Comments)  . Amoxicillin Nausea Only  . Ezetimibe     Other reaction(s): Unknown  . Glucosamine-Chondroitin     Other reaction(s): Unknown  . Lovastatin Other (See Comments)  . Omeprazole Nausea Only  . Azithromycin Nausea Only  . Erythromycin Base Rash  . Heparin Rash and Other (See Comments)  . Other Rash    Family History  Problem Relation Age of Onset  . Stroke Mother   . Hypertension Mother   . Hypertension Father   . Diabetes Daughter   . Breast cancer Neg Hx      Prior to Admission medications   Medication Sig Start Date End Date Taking? Authorizing Provider  ACCU-CHEK SOFTCLIX LANCETS lancets  03/15/15   [provider]  albuterol (PROVENTIL HFA;VENTOLIN HFA) 108 (90 Base) MCG/ACT inhaler Inhale 1-2 puffs into the lungs every 6 (six) hours as needed for wheezing or shortness of breath. Use with spacer 10/20/18   Melynda Ripple, MD  aspirin 81 MG tablet Take 81 mg by mouth daily. AM    [provider]  atorvastatin (LIPITOR) 10 MG tablet Take 10 mg by mouth daily. AM    [provider]  escitalopram (LEXAPRO) 20 MG tablet Take 1 tablet (20 mg total) by mouth daily. AM 03/13/19   Rainey Pines, MD  fluticasone (FLONASE) 50 MCG/ACT nasal spray Place 2 sprays into both nostrils daily. 10/20/18   Melynda Ripple, MD  gabapentin (NEURONTIN) 100 MG capsule Take 1 capsule (100 mg total) by mouth at bedtime. 03/13/19 03/12/20   Rainey Pines, MD  linagliptin (TRADJENTA) 5 MG TABS tablet Take by mouth. 09/02/18   [provider]  lisinopril-hydrochlorothiazide (PRINZIDE,ZESTORETIC) 20-12.5 MG tablet Take 1 tablet by mouth daily.    [provider]  memantine (NAMENDA) 10 MG tablet Take 1 tablet (10 mg total) by mouth daily. PM 03/13/19   Rainey Pines, MD  Multiple Vitamin (THERA) TABS Take 1 tablet by mouth daily.  05/11/14   [provider]  Multiple Vitamins-Minerals (VISION-VITE PRESERVE PO) Take by mouth.    [provider]  Spacer/Aero-Holding Chambers (AEROCHAMBER PLUS) inhaler Use as instructed 10/20/18   Melynda Ripple, MD    Physical Exam: Vitals:   10/16/20 1637 10/16/20 2005 10/16/20 2100 10/16/20 2200  BP: 136/71 (!) 147/69 (!) 134/56 130/68  Pulse: 73 72 75 72  Resp: 18 19 17 15   Temp:      TempSrc:      SpO2: 97% 95% 98% 93%  Weight:  Height:        Constitutional: NAD, calm, thin elderly female laying flat in bed Vitals:   10/16/20 1637 10/16/20 2005 10/16/20 2100 10/16/20 2200  BP: 136/71 (!) 147/69 (!) 134/56 130/68  Pulse: 73 72 75 72  Resp: 18 19 17 15   Temp:      TempSrc:      SpO2: 97% 95% 98% 93%  Weight:      Height:       Eyes: PERRL, lids and conjunctivae normal ENMT: Mucous membranes are moist.  Neck: normal, supple Respiratory: clear to auscultation bilaterally, no wheezing, no crackles. Normal respiratory effort. No accessory muscle use.  Cardiovascular: Regular rate and rhythm, 2 out of 6 systolic murmur. No extremity edema. 2+ pedal pulses.   Abdomen: no tenderness, no masses palpated.  Bowel sounds positive.  Musculoskeletal: no clubbing / cyanosis. No joint deformity upper and lower extremities. Good ROM, no contractures. Normal muscle tone.  Skin: no rashes, lesions, ulcers. No induration Neurologic: CN 2-12 grossly intact.  Endorsed decreased sensation to bilateral lower extremity from feet to anterior thigh. strength 4/5 in  all 4.  Psychiatric: Normal judgment and insight. Alert and oriented x 3. Normal mood.     Labs on Admission: I have personally reviewed following labs and imaging studies  CBC: Recent Labs  Lab 10/16/20 1205  WBC 5.9  HGB 10.6*  HCT 34.1*  MCV 95.3  PLT [id number]   Basic Metabolic Panel: Recent Labs  Lab 10/16/20 1205  NA 141  K 4.7  CL 102  CO2 27  GLUCOSE 134*  BUN 30*  CREATININE 1.07*  CALCIUM 9.2   GFR: Estimated Creatinine Clearance: 39.5 mL/min (A) (by C-G formula based on SCr of 1.07 mg/dL (H)). Liver Function Tests: No results for input(s): AST, ALT, ALKPHOS, BILITOT, PROT, ALBUMIN in the last 168 hours. No results for input(s): LIPASE, AMYLASE in the last 168 hours. No results for input(s): AMMONIA in the last 168 hours. Coagulation Profile: No results for input(s): INR, PROTIME in the last 168 hours. Cardiac Enzymes: No results for input(s): CKTOTAL, CKMB, CKMBINDEX, TROPONINI in the last 168 hours. BNP (last 3 results) No results for input(s): PROBNP in the last 8760 hours. HbA1C: No results for input(s): HGBA1C in the last 72 hours. CBG: Recent Labs  Lab 10/16/20 1159  GLUCAP 136*   Lipid Profile: No results for input(s): CHOL, HDL, LDLCALC, TRIG, CHOLHDL, LDLDIRECT in the last 72 hours. Thyroid Function Tests: No results for input(s): TSH, T4TOTAL, FREET4, T3FREE, THYROIDAB in the last 72 hours. Anemia Panel: No results for input(s): VITAMINB12, FOLATE, FERRITIN, TIBC, IRON, RETICCTPCT in the last 72 hours. Urine analysis:    Component Value Date/Time   COLORURINE YELLOW (A) 10/16/2020 2108   APPEARANCEUR HAZY (A) 10/16/2020 2108   APPEARANCEUR Clear 05/07/2014 1258   LABSPEC 1.016 10/16/2020 2108   LABSPEC 1.014 05/07/2014 1258   PHURINE 5.0 10/16/2020 2108   GLUCOSEU NEGATIVE 10/16/2020 2108   GLUCOSEU Negative 05/07/2014 1258   HGBUR NEGATIVE 10/16/2020 2108   BILIRUBINUR NEGATIVE 10/16/2020 2108   BILIRUBINUR Negative 05/07/2014 [place] 10/16/2020 2108   PROTEINUR NEGATIVE 10/16/2020 2108   NITRITE NEGATIVE 10/16/2020 2108   LEUKOCYTESUR SMALL (A) 10/16/2020 2108   LEUKOCYTESUR Negative 05/07/2014 1258    Radiological Exams on Admission: DG Chest 2 View  Result Date: 10/16/2020 CLINICAL DATA:  Weakness EXAM: CHEST - 2 VIEW COMPARISON:  01/16/2019 FINDINGS: Mild left basilar scarring. Lungs are otherwise clear. No  pneumothorax or pleural effusion. Moderate hiatal hernia. Cardiac size within normal limits. No acute bone abnormality. IMPRESSION: No active cardiopulmonary disease. Electronically Signed   By: Fidela Salisbury MD   On: 10/16/2020 22:32   CT Head Wo Contrast  Result Date: 10/16/2020 CLINICAL DATA:  Generalized weakness EXAM: CT HEAD WITHOUT CONTRAST TECHNIQUE: Contiguous axial images were obtained from the base of the skull through the vertex without intravenous contrast. COMPARISON:  CT brain 05/07/2014 FINDINGS: Brain: No acute territorial infarction, hemorrhage or intracranial mass. Moderate atrophy. Marked hypodensity in the white matter consistent with chronic small vessel ischemic change. Stable ventricle size. Vascular: No hyperdense vessels.  Carotid vascular calcification Skull: Normal. Negative for fracture or focal lesion. Right frontal burr hole. Sinuses/Orbits: No acute finding. Other: None IMPRESSION: 1. No CT evidence for acute intracranial abnormality. 2. Atrophy and chronic small vessel ischemic changes of the white matter. Electronically Signed   By: Donavan Foil M.D.   On: 10/16/2020 22:46      Assessment/Plan  Generalized weakness/peripheral numbness Pt presents from assisted living.  Normally uses walker for assistance but with worsening weakness. UA shows mild leukocyte and negative nitrite with rare bacteria.  Not very convincing for UTI.  However she was started on Rocephin in the ED so will continue for now pending urine culture. Will obtain blood culture  Check TSH   Check vitamin B12 EKG shows new RBBB but troponin reassuring at 6  Acute on CKD stage 3a Has received 1 L of LR in the ED. Avoid nephrotoxic agent Follow with repeat BMP in the morning  Cognitive impairment/depression Continue Lexapro and memantine  Hypertension Controlled without antihypertensives  Type 2 diabetes Continue Tradjenta  DVT prophylaxis:.SCDs Code Status: DNR Family Communication: Plan discussed with patient and daughter at bedside  disposition Plan:ALF with observation Consults called:  Admission status: Observation  Level of care: Med-Surg  Status is: Observation  The patient remains OBS appropriate and will d/c before 2 midnights.  Dispo: The patient is from: ALF              Anticipated d/c is to: ALF              Anticipated d/c date is: 1 day              Patient currently is not medically stable to d/c.   Difficult to place patient No         Orene Desanctis DO [facility]   If 7PM-7AM, please contact night-coverage [email or web address]   10/17/2020, 12:21 AM

## 2020-10-17 NOTE — ED Notes (Signed)
Pt eating breakfast at this time.  

## 2020-10-17 NOTE — Evaluation (Signed)
Physical Therapy Evaluation Patient Details Name: Lori Gilmore MRN: [id number] DOB: 1934/05/30 Today's Date: 10/17/2020   History of Present Illness  Patient is a 87 y.o. female with medical history significant for cognitive impairment, CKD stage III, type 2 diabetes and hypertension who presents from her assisted living with concerns of weakness and peripherial numbness. Head CT with no evidence for acute intracranial abnormality per report. Past medical history significant for anemia, arthritis, blood clots in brain, depression, renal cancer, TIA, diabetes mellitus.  Clinical Impression  Patient is pleasant and cooperative with PT evaluation. Patient has generalized weakness in bilateral LE and burning sensation in bilateral feet. She reports feeling increase in her weakness recently at ALF with one reported fall within the past 6 months. Patient needs Min guard for transfers and ambulated a short distance in the room with hand held assistance with no gross loss of balance. Patient is hopeful to return back to ALF at discharge. Recommend PT to maximize independence and address functional limitations listed below. Recommend HHPT at ALF at discharge.     Follow Up Recommendations Home health PT (return to ALF with HHPT)    Equipment Recommendations  None recommended by PT    Recommendations for Other Services       Precautions / Restrictions Precautions Precautions: Fall Restrictions Weight Bearing Restrictions: No      Mobility  Bed Mobility Overal bed mobility: Needs Assistance Bed Mobility: Supine to Sit;Sit to Supine     Supine to sit: Supervision;HOB elevated Sit to supine: Supervision;HOB elevated   General bed mobility comments: no phyiscal assistance required    Transfers Overall transfer level: Needs assistance Equipment used: None Transfers: Sit to/from Stand Sit to Stand: Supervision;Min guard Stand pivot transfers: Supervision;Min guard       General  transfer comment: Min guard with standing and supervision with stand to sit.  Ambulation/Gait Ambulation/Gait assistance: Min guard Gait Distance (Feet): 10 Feet Assistive device: 1 person hand held assist Gait Pattern/deviations: Narrow base of support Gait velocity: decreased   General Gait Details: no gross loss of balance with ambulating short distance in room. Sp02 98% on room air  Stairs            Wheelchair Mobility    Modified Rankin (Stroke Patients Only)       Balance Overall balance assessment: History of Falls;Needs assistance (one fall reported in the past 6 months (fell from bed at ALF)) Sitting-balance support: Feet supported;No upper extremity supported Sitting balance-Leahy Scale: Normal     Standing balance support: Single extremity supported Standing balance-Leahy Scale: Fair                               Pertinent Vitals/Pain Pain Assessment: Faces Pain Score: 4  Faces Pain Scale: Hurts a little bit Pain Location: feet (right more than left) Pain Descriptors / Indicators: Burning Pain Intervention(s): Monitored during session    Home Living Family/patient expects to be discharged to:: Assisted living               Home Equipment: Kasandra Knudsen - single point;Walker - standard      Prior Function Level of Independence: Independent with assistive device(s)         Comments: patient reports she is Mod I with single point cane and lives with a roomate at ALF. Patient performes ADLs independently per he report.     Hand Dominance   Dominant Hand: Right  Extremity/Trunk Assessment   Upper Extremity Assessment Upper Extremity Assessment: Overall WFL for tasks assessed;Defer to OT evaluation    Lower Extremity Assessment Lower Extremity Assessment: Generalized weakness RLE Deficits / Details: knee extension 4/5, dorsiflexion and plantarflexion 4+/5 RLE Sensation:  (burning reported distally throughout feet) LLE Deficits  / Details: knee extension, dorsiflexion, plantarflexion 4+/5 LLE Sensation:  (burning reported distally throughout feet)       Communication   Communication: No difficulties  Cognition Arousal/Alertness: Awake/alert Behavior During Therapy: WFL for tasks assessed/performed Overall Cognitive Status: History of cognitive impairments - at baseline                                 General Comments: patient able to follow single step commands with increased time      General Comments      Exercises     Assessment/Plan    PT Assessment Patient needs continued PT services  PT Problem List Decreased strength;Decreased activity tolerance;Decreased balance;Decreased mobility;Decreased safety awareness       PT Treatment Interventions DME instruction;Gait training;Functional mobility training;Therapeutic exercise;Therapeutic activities;Balance training;Neuromuscular re-education    PT Goals (Current goals can be found in the Care Plan section)  Acute Rehab PT Goals Patient Stated Goal: to return home when ready PT Goal Formulation: With patient Time For Goal Achievement: 10/31/20 Potential to Achieve Goals: Good    Frequency Min 2X/week   Barriers to discharge        Co-evaluation               AM-PAC PT "6 Clicks" Mobility  Outcome Measure Help needed turning from your back to your side while in a flat bed without using bedrails?: A Little Help needed moving from lying on your back to sitting on the side of a flat bed without using bedrails?: A Little Help needed moving to and from a bed to a chair (including a wheelchair)?: A Little Help needed standing up from a chair using your arms (e.g., wheelchair or bedside chair)?: A Little Help needed to walk in hospital room?: A Little Help needed climbing 3-5 steps with a railing? : A Little 6 Click Score: 18    End of Session   Activity Tolerance: Patient tolerated treatment well Patient left: in bed;with  call bell/phone within reach (set-up with lunch tray at bedside) Nurse Communication:  (white board up to date with mobility status) PT Visit Diagnosis: Muscle weakness (generalized) (M62.81);Unsteadiness on feet (R26.81)    Time: 1145-1200 PT Time Calculation (min) (ACUTE ONLY): 15 min   Charges:   PT Evaluation $PT Eval Low Complexity: 1 Low          Minna Merritts, PT, MPT   Percell Locus 10/17/2020, 1:33 PM

## 2020-10-17 NOTE — ED Notes (Signed)
Pt to be transported to 1A at this time.

## 2020-10-17 NOTE — Evaluation (Signed)
Occupational Therapy Evaluation Patient Details Name: Lori Gilmore MRN: [id number] DOB: [birth year] Today's Date: 10/17/2020    History of Present Illness 87 y.o. female with medical history significant for cognitive impairment, CKD stage III, type 2 diabetes and hypertension who presents from her assisted living with concerns of weakness.  Daughter is at bedside.  Patient reports that after eating breakfast today she felt like she could not "feel anything her body."  She feels weak and had trouble with mobility.  She has chronic peripheral neuropathy of her feet due to diabetes but today feels like her numbness is up higher on her legs.  She had a fall about several weeks ago but recovered without any issues.  Denies any trauma to her back or any history of back surgery. No other recent surgeries or procedures. Denies any incontinence.  She denies any headaches, dizziness or blurry vision.  Denies any chest pain or shortness of breath.  Has had a runny nose for the past week but denies any fever or cough.  No known sick contact.  Denies any nausea, vomiting or diarrhea.  No dysuria, increased urgency or frequency.  No new medication changes. Currently she is feeling generalized malaise and is complaining of feeling "very cold."   Clinical Impression   Patient presenting with decreased I in self care, balance, functional mobility/transfer, endurance, and safety awareness. Pt has cognitive impairments at baseline. Patient reports living in ALF and mod I with use of SPC for ambulation for self care tasks and functional mobility PTA. Patient currently functioning at supervision - min guard without use of AD. Pt reports feeling close to baseline. Pt would benefit from OT intervention to continue to address functional deficits before returning to ALF. Patient will benefit from acute OT to increase overall independence in the areas of ADLs, functional mobility, and safety awareness in order to safely  discharge to next venue of care.    Follow Up Recommendations  No OT follow up;Supervision - Intermittent    Equipment Recommendations  None recommended by OT       Precautions / Restrictions Precautions Precautions: Fall      Mobility Bed Mobility Overal bed mobility: Needs Assistance Bed Mobility: Supine to Sit;Sit to Supine     Supine to sit: Supervision Sit to supine: Supervision   General bed mobility comments: no physical assist needed    Transfers Overall transfer level: Needs assistance Equipment used: None Transfers: Sit to/from Stand;Stand Pivot Transfers Sit to Stand: Supervision Stand pivot transfers: Supervision;Min guard            Balance Overall balance assessment: Mild deficits observed, not formally tested                                         ADL either performed or assessed with clinical judgement   ADL Overall ADL's : Needs assistance/impaired Eating/Feeding: Modified independent   Grooming: Wash/dry hands;Wash/dry face;Oral care;Supervision/safety;Standing               Lower Body Dressing: Set up;Supervision/safety Lower Body Dressing Details (indicate cue type and reason): donning socks and shoes             Functional mobility during ADLs: Supervision/safety;Cueing for safety General ADL Comments: close supervision for mobility and self care tasks     Vision Baseline Vision/History: Wears glasses Wears Glasses: At all times Patient Visual Report: No change  from baseline              Pertinent Vitals/Pain Pain Assessment: 0-10 Pain Score: 4  Pain Location: feet Pain Descriptors / Indicators: Tingling;Pins and needles Pain Intervention(s): Limited activity within patient's tolerance;Monitored during session;Repositioned     Hand Dominance Right   Extremity/Trunk Assessment Upper Extremity Assessment Upper Extremity Assessment: Overall WFL for tasks assessed   Lower Extremity  Assessment Lower Extremity Assessment: Defer to PT evaluation       Communication Communication Communication: No difficulties   Cognition Arousal/Alertness: Awake/alert Behavior During Therapy: WFL for tasks assessed/performed Overall Cognitive Status: History of cognitive impairments - at baseline                                 General Comments: plesantly confused. Oriented to self and location. follows 1 step commands with increased time. [place] expects to be discharged to:: Assisted living                             Home Equipment: Kasandra Knudsen - single point;Walker - standard          Prior Functioning/Environment Level of Independence: Independent with assistive device(s)        Comments: Pt lives in assisted living. She has a roommate and ambulates to bathroom and performs self care tasks with mod I per pt report        OT Problem List: Decreased activity tolerance;Impaired balance (sitting and/or standing);Decreased safety awareness;Pain;Decreased knowledge of use of DME or AE;Decreased strength      OT Treatment/Interventions: Self-care/ADL training;Therapeutic exercise;Energy conservation;DME and/or AE instruction;Cognitive remediation/compensation;Manual therapy;Patient/family education;Therapeutic activities    OT Goals(Current goals can be found in the care plan section) Acute Rehab OT Goals Patient Stated Goal: to return home OT Goal Formulation: With patient Time For Goal Achievement: 10/31/20 Potential to Achieve Goals: Good ADL Goals Pt Will Perform Grooming: with modified independence Pt Will Perform Lower Body Dressing: with modified independence Pt Will Transfer to Toilet: with modified independence;ambulating Pt Will Perform Toileting - Clothing Manipulation and hygiene: with modified independence;sit to/from stand Pt Will Perform Tub/Shower Transfer: with supervision;shower  seat;ambulating  OT Frequency: Min 1X/week   Barriers to D/C:    none known at this time          AM-PAC OT "6 Clicks" Daily Activity     Outcome Measure Help from another person eating meals?: None Help from another person taking care of personal grooming?: None Help from another person toileting, which includes using toliet, bedpan, or urinal?: A Little Help from another person bathing (including washing, rinsing, drying)?: A Little Help from another person to put on and taking off regular upper body clothing?: None Help from another person to put on and taking off regular lower body clothing?: A Little 6 Click Score: 21   End of Session Nurse Communication: Mobility status  Activity Tolerance: Patient tolerated treatment well Patient left: in bed  OT Visit Diagnosis: Unsteadiness on feet (R26.81);Muscle weakness (generalized) (M62.81)                Time: 6962-9528 OT Time Calculation (min): 19 min Charges:  OT General Charges $OT Visit: 1 Visit OT Evaluation $OT Eval Low Complexity: 1 Low OT Treatments $Self Care/Home Management : 8-22 mins  Darleen Crocker,  MS, OTR/L , CBIS ascom [phone]  10/17/20, 12:37 PM

## 2020-10-17 NOTE — ED Notes (Signed)
PT at bedside.

## 2020-10-17 NOTE — ED Notes (Signed)
Blood cultures obtained before rocephin given.

## 2020-10-17 NOTE — ED Notes (Signed)
Called pharmacy to check on getting pt an inhaler for her COPD that she uses at home.

## 2020-10-17 NOTE — ED Notes (Signed)
Pt given lunch tray.

## 2020-10-17 NOTE — ED Notes (Signed)
Admitting MD Tu at bedside  

## 2020-10-17 NOTE — Progress Notes (Signed)
PROGRESS NOTE    Lori ANASTAS  QVZ:[id number] DOB: 1934-09-21 DOA: 10/16/2020 PCP: Ezequiel Kayser, MD   Brief Narrative: 87 y.o. female with medical history significant for cognitive impairment, CKD stage III, type 2 diabetes and hypertension who presents from her assisted living with concerns of weakness.  Daughter is at bedside. Patient reports that after eating breakfast today she felt like she could not "feel anything her body."  She feels weak and had trouble with mobility.  She has chronic peripheral neuropathy of her feet due to diabetes but today feels like her numbness is up higher on her legs.  She had a fall about several weeks ago but recovered without any issues.  Denies any trauma to her back or any history of back surgery. No other recent surgeries or procedures. Denies any incontinence.  She denies any headaches, dizziness or blurry vision.  Denies any chest pain or shortness of breath.  Has had a runny nose for the past week but denies any fever or cough.  No known sick contact.   Patient continues to endorse worsening numbness and paresthesias.  Urinalysis weakly indicative of infection.  Urine culture was taken and the patient was started on Rocephin.   Assessment & Plan:   Principal Problem:   Weakness Active Problems:   Major depressive disorder, recurrent episode, in full remission (HCC)   Benign essential HTN   Chronic kidney disease (CKD), stage III (moderate) (HCC)   Diabetes mellitus (HCC)   Mild cognitive disorder  Generalized weakness/peripheral numbness Pt presents from assisted living.   Normally uses walker for assistance but with worsening weakness. TSH normal Vitamin B12 normal Plan: Therapy evaluations Fall precautions Telemetry  Urinary tract infection Symptoms are slightly vague but could be consistent with urinary tract infection Started on Rocephin in ED Plan: Continue Rocephin Monitor culture data Monitor fever curve Limit  antibiotic course to 3 days   Acute on CKD stage 3a, resolved Has received 1 L of LR in the ED. Avoid nephrotoxic agent\  Cognitive impairment/depression Continue Lexapro and memantine  Hypertension Controlled without antihypertensives  Type 2 diabetes Continue Tradjenta   DVT prophylaxis: SCD Code Status: DNR Family Communication: Daughter Nevin Bloodgood [phone] on 10/17/2020 Disposition Plan: Status is: Observation  The patient will require care spanning > 2 midnights and should be moved to inpatient because: Altered mental status, Unsafe d/c plan and Inpatient level of care appropriate due to severity of illness  Dispo: The patient is from: ALF              Anticipated d/c is to: ALF              Anticipated d/c date is: 1 day              Patient currently is not medically stable to d/c.   Difficult to place patient No  Unclear source of weakness.  Will treat as presumed urinary tract infection.  Continue IV Rocephin.  Monitor culture data.  Possible discharge on 10/18/2020      Level of care: Med-Surg  Consultants:   None  Procedures:   None  Antimicrobials:   Ceftriaxone   Subjective: Seen and examined.  Continues to endorse numbness and weakness.  Hemodynamically stable.  Objective: Vitals:   10/17/20 1015 10/17/20 1030 10/17/20 1350 10/17/20 1355  BP:  134/67  135/68  Pulse: 73 72 73 72  Resp:  18  18  Temp:      TempSrc:  SpO2: 95% 95% 96% 96%  Weight:      Height:        Intake/Output Summary (Last 24 hours) at 10/17/2020 1423 Last data filed at 10/17/2020 0824 Gross per 24 hour  Intake 100 ml  Output 500 ml  Net -400 ml   Filed Weights   10/16/20 1146 10/17/20 0823  Weight: 64 kg 64 kg    Examination:  General exam: Appears calm and comfortable  Respiratory system: Clear to auscultation. Respiratory effort normal. Cardiovascular system: S1 & S2 heard, RRR. No JVD, murmurs, rubs, gallops or clicks. No pedal  edema. Gastrointestinal system: Abdomen is nondistended, soft and nontender. No organomegaly or masses felt. Normal bowel sounds heard. Central nervous system: Alert and oriented. No focal neurological deficits. Extremities: Symmetric 5 x 5 power. Skin: No rashes, lesions or ulcers Psychiatry: Judgement and insight appear normal. Mood & affect appropriate.     Data Reviewed: I have personally reviewed following labs and imaging studies  CBC: Recent Labs  Lab 10/16/20 1205 10/17/20 0452  WBC 5.9 5.3  HGB 10.6* 9.3*  HCT 34.1* 29.5*  MCV 95.3 94.2  PLT 314 413   Basic Metabolic Panel: Recent Labs  Lab 10/16/20 1205 10/17/20 0452  NA 141 138  K 4.7 4.1  CL 102 105  CO2 27 27  GLUCOSE 134* 110*  BUN 30* 26*  CREATININE 1.07* 0.91  CALCIUM 9.2 8.3*   GFR: Estimated Creatinine Clearance: 46.4 mL/min (by C-G formula based on SCr of 0.91 mg/dL). Liver Function Tests: No results for input(s): AST, ALT, ALKPHOS, BILITOT, PROT, ALBUMIN in the last 168 hours. No results for input(s): LIPASE, AMYLASE in the last 168 hours. No results for input(s): AMMONIA in the last 168 hours. Coagulation Profile: No results for input(s): INR, PROTIME in the last 168 hours. Cardiac Enzymes: No results for input(s): CKTOTAL, CKMB, CKMBINDEX, TROPONINI in the last 168 hours. BNP (last 3 results) No results for input(s): PROBNP in the last 8760 hours. HbA1C: No results for input(s): HGBA1C in the last 72 hours. CBG: Recent Labs  Lab 10/16/20 1159  GLUCAP 136*   Lipid Profile: No results for input(s): CHOL, HDL, LDLCALC, TRIG, CHOLHDL, LDLDIRECT in the last 72 hours. Thyroid Function Tests: Recent Labs    10/17/20 0452  TSH 1.364   Anemia Panel: Recent Labs    10/17/20 0452  VITAMINB12 701   Sepsis Labs: No results for input(s): PROCALCITON, LATICACIDVEN in the last 168 hours.  Recent Results (from the past 240 hour(s))  SARS Coronavirus 2 by RT PCR (hospital order, performed  in [facility] hospital lab) Nasopharyngeal Nasopharyngeal Swab     Status: None   Collection Time: 10/16/20  8:17 PM   Specimen: Nasopharyngeal Swab  Result Value Ref Range Status   SARS Coronavirus 2 NEGATIVE NEGATIVE Final    Comment: (NOTE) SARS-CoV-2 target nucleic acids are NOT DETECTED.  The SARS-CoV-2 RNA is generally detectable in upper and lower respiratory specimens during the acute phase of infection. The lowest concentration of SARS-CoV-2 viral copies this assay can detect is 250 copies / mL. A negative result does not preclude SARS-CoV-2 infection and should not be used as the sole basis for treatment or other patient management decisions.  A negative result may occur with improper specimen collection / handling, submission of specimen other than nasopharyngeal swab, presence of viral mutation(s) within the areas targeted by this assay, and inadequate number of viral copies (<250 copies / mL). A negative result must be combined  with clinical observations, patient history, and epidemiological information.  Fact Sheet for Patients:   StrictlyIdeas.no  Fact Sheet for Healthcare Providers: BankingDealers.co.za  This test is not yet approved or  cleared by the [place] FDA and has been authorized for detection and/or diagnosis of SARS-CoV-2 by FDA under an Emergency Use Authorization (EUA).  This EUA will remain in effect (meaning this test can be used) for the duration of the COVID-19 declaration under Section 564(b)(1) of the Act, 21 U.S.C. section 360bbb-3(b)(1), unless the authorization is terminated or revoked sooner.  Performed at [facility], [address] [place], [place] [address]   CULTURE, BLOOD (ROUTINE X 2) w Reflex to ID Panel     Status: None (Preliminary result)   Collection Time: 10/17/20  1:42 AM   Specimen: BLOOD  Result Value Ref Range Status   Specimen Description BLOOD BLOOD LEFT  WRIST  Final   Special Requests   Final    BOTTLES DRAWN AEROBIC AND ANAEROBIC Blood Culture adequate volume   Culture   Final    NO GROWTH < 12 HOURS Performed at [facility], [address] [place], [place] [address]    Report Status PENDING  Incomplete  CULTURE, BLOOD (ROUTINE X 2) w Reflex to ID Panel     Status: None (Preliminary result)   Collection Time: 10/17/20  1:42 AM   Specimen: BLOOD  Result Value Ref Range Status   Specimen Description BLOOD BLOOD RIGHT HAND  Final   Special Requests   Final    BOTTLES DRAWN AEROBIC AND ANAEROBIC Blood Culture adequate volume   Culture   Final    NO GROWTH < 12 HOURS Performed at [facility], [address] [place], [place] [address]    Report Status PENDING  Incomplete         Radiology Studies: DG Chest 2 View  Result Date: 10/16/2020 CLINICAL DATA:  Weakness EXAM: CHEST - 2 VIEW COMPARISON:  01/16/2019 FINDINGS: Mild left basilar scarring. Lungs are otherwise clear. No pneumothorax or pleural effusion. Moderate hiatal hernia. Cardiac size within normal limits. No acute bone abnormality. IMPRESSION: No active cardiopulmonary disease. Electronically Signed   By: Fidela Salisbury MD   On: 10/16/2020 22:32   CT Head Wo Contrast  Result Date: 10/16/2020 CLINICAL DATA:  Generalized weakness EXAM: CT HEAD WITHOUT CONTRAST TECHNIQUE: Contiguous axial images were obtained from the base of the skull through the vertex without intravenous contrast. COMPARISON:  CT brain 05/07/2014 FINDINGS: Brain: No acute territorial infarction, hemorrhage or intracranial mass. Moderate atrophy. Marked hypodensity in the white matter consistent with chronic small vessel ischemic change. Stable ventricle size. Vascular: No hyperdense vessels.  Carotid vascular calcification Skull: Normal. Negative for fracture or focal lesion. Right frontal burr hole. Sinuses/Orbits: No acute finding. Other: None IMPRESSION: 1. No CT evidence for acute  intracranial abnormality. 2. Atrophy and chronic small vessel ischemic changes of the white matter. Electronically Signed   By: Donavan Foil M.D.   On: 10/16/2020 22:46        Scheduled Meds: . aspirin EC  81 mg Oral Daily  . escitalopram  20 mg Oral Daily  . gabapentin  100 mg Oral QHS  . linagliptin  5 mg Oral Daily  . lisinopril-hydrochlorothiazide  1 tablet Oral Daily  . memantine  10 mg Oral QPM  . vitamin B-12  500 mcg Oral Daily   Continuous Infusions: . cefTRIAXone (ROCEPHIN)  IV    . lactated ringers 50 mL/hr  at 10/17/20 0853     LOS: 0 days    Time spent: 25 minutes    Sidney Ace, MD [facility] Pager [phone]  If 7PM-7AM, please contact night-coverage 10/17/2020, 2:23 PM

## 2020-10-18 DIAGNOSIS — R531 Weakness: Secondary | ICD-10-CM | POA: Diagnosis not present

## 2020-10-18 LAB — CBC WITH DIFFERENTIAL/PLATELET
Abs Immature Granulocytes: 0.02 10*3/uL (ref 0.00–0.07)
Basophils Absolute: 0 10*3/uL (ref 0.0–0.1)
Basophils Relative: 1 %
Eosinophils Absolute: 0.2 10*3/uL (ref 0.0–0.5)
Eosinophils Relative: 3 %
HCT: 31.4 % — ABNORMAL LOW (ref 36.0–46.0)
Hemoglobin: 9.9 g/dL — ABNORMAL LOW (ref 12.0–15.0)
Immature Granulocytes: 0 %
Lymphocytes Relative: 23 %
Lymphs Abs: 1.3 10*3/uL (ref 0.7–4.0)
MCH: 29.9 pg (ref 26.0–34.0)
MCHC: 31.5 g/dL (ref 30.0–36.0)
MCV: 94.9 fL (ref 80.0–100.0)
Monocytes Absolute: 0.6 10*3/uL (ref 0.1–1.0)
Monocytes Relative: 12 %
Neutro Abs: 3.3 10*3/uL (ref 1.7–7.7)
Neutrophils Relative %: 61 %
Platelets: 270 10*3/uL (ref 150–400)
RBC: 3.31 MIL/uL — ABNORMAL LOW (ref 3.87–5.11)
RDW: 13.4 % (ref 11.5–15.5)
WBC: 5.4 10*3/uL (ref 4.0–10.5)
nRBC: 0 % (ref 0.0–0.2)

## 2020-10-18 LAB — BASIC METABOLIC PANEL
Anion gap: 9 (ref 5–15)
BUN: 23 mg/dL (ref 8–23)
CO2: 27 mmol/L (ref 22–32)
Calcium: 8.5 mg/dL — ABNORMAL LOW (ref 8.9–10.3)
Chloride: 104 mmol/L (ref 98–111)
Creatinine, Ser: 0.92 mg/dL (ref 0.44–1.00)
GFR, Estimated: >60 mL/min (ref >60)
Glucose, Bld: 130 mg/dL — ABNORMAL HIGH (ref 70–99)
Potassium: 4.5 mmol/L (ref 3.5–5.1)
Sodium: 140 mmol/L (ref 135–145)

## 2020-10-18 LAB — MAGNESIUM: Magnesium: 2 mg/dL (ref 1.7–2.4)

## 2020-10-18 MED ORDER — GABAPENTIN 100 MG PO CAPS: 100.0000 mg | ORAL_CAPSULE | Freq: Two times a day (BID) | ORAL | 0 refills | Status: AC

## 2020-10-18 NOTE — Discharge Summary (Signed)
Physician Discharge Summary  Lori Gilmore WNI:[id number] DOB: [birth year] DOA: 10/16/2020  PCP: Mickey Farber, MD  Admit date: 10/16/2020 Discharge date: 10/18/2020  Admitted From:ALF Disposition:  ALF  Recommendations for Outpatient Follow-up:  1. Follow up with PCP in 1-2 weeks 2. Request referral to neurologist for further evaluation and work-up of neuropathy  Home Health: Yes Home PT and OT Equipment/Devices: None Discharge Condition: Stable CODE STATUS: DNR Diet recommendation: Heart Healthy  Brief/Interim Summary: 87 y.o.femalewith medical history significant forcognitive impairment, CKD stage III, type 2 diabetes and hypertension who presents from her assisted living with concerns of weakness. Daughter is at bedside. Patient reports that after eating breakfast today she felt like she could not "feel anything her body."She feels weak and had trouble with mobility. She has chronic peripheral neuropathy of her feet due to diabetes but today feels like her numbnessis up higher on her legs. She had a fall about several weeks ago but recovered without any issues. Denies any trauma to her back or any history of back surgery. No other recent surgeries or procedures.Denies any incontinence. She denies any headaches, dizziness or blurry vision. Denies any chest pain or shortness of breath. Has had a runny nose for the past week but denies any feveror cough. No known sick contact.   Patient continues to endorse worsening numbness and paresthesias.  Urinalysis weakly indicative of infection.  Urine culture was taken and the patient was started on Rocephin.  At time of discharge patient's neuropathy is slowly improving but still present.  No clear infectious or metabolic etiology.  I had a long conversation with the patient and the patient's daughter.  At this point my recommendation will be to increase patient's home gabapentin 200 mg twice daily.  I also strongly suggest that  the patient seek follow-up with a neurologist for further evaluation and management of her progressive neuropathy.  Discharge Diagnoses:  Principal Problem:   Weakness Active Problems:   Major depressive disorder, recurrent episode, in full remission (HCC)   Benign essential HTN   Chronic kidney disease (CKD), stage III (moderate) (HCC)   Diabetes mellitus (HCC)   Mild cognitive disorder   Generalized weakness/peripheral numbness Ptpresents from assisted living.  Normally uses walker for assistance but with worsening weakness. TSH normal Vitamin B12 normal No clear infectious or metabolic etiology Suspect progression of underlying peripheral neuropathy Home PT and OT ordered on discharge Recommend increasing dose of gabapentin from 100 mg nightly 200 mg twice daily Defer further up titration of his medication for now Recommended to patient's daughter that she seek a consultation with neurology on outpatient basis I have included office phone number and name for [facility] neurology if patient chooses to follow-up there  Possible UTI, ruled out Symptoms are slightly vague but could be consistent with urinary tract infection Started on Rocephin in ED  urine culture no growth at time of discharge Patient received 3 days of Rocephin No further antibiotics at time of discharge  Acute on CKD stage 3a, resolved Hasreceived 1 L of LR in the ED. Avoid nephrotoxic agent Resume home regimen on discharge  Cognitive impairment/depression Continue Lexapro and memantine  Hypertension Controlled without antihypertensives  Type 2 diabetes Continue Tradjenta  Discharge Instructions  Discharge Instructions    Diet - low sodium heart healthy   Complete by: As directed    Increase activity slowly   Complete by: As directed      Allergies as of 10/18/2020      Reactions  Erythromycin Other (See Comments)   Oxycodone-acetaminophen Nausea And Vomiting    Oxycodone-acetaminophen Nausea And Vomiting   Tapentadol Other (See Comments)   Tapentadol Hcl Nausea And Vomiting   Amoxicillin Nausea Only   Atorvastatin    Ezetimibe    Other reaction(s): Unknown   Glucosamine-chondroitin    Other reaction(s): Unknown   Lovastatin Other (See Comments)   Omeprazole Nausea Only   Azithromycin Nausea Only   Erythromycin Base Rash   Heparin Rash, Other (See Comments)   Other reaction(s): Other (See Comments)   Other Rash      Medication List    TAKE these medications   Accu-Chek Softclix Lancets lancets   acetaminophen 325 MG tablet Commonly known as: TYLENOL Take 650 mg by mouth every 4 (four) hours as needed for headache, fever or mild pain. Up to 24 hours   albuterol 108 (90 Base) MCG/ACT inhaler Commonly known as: VENTOLIN HFA Inhale 1-2 puffs into the lungs every 6 (six) hours as needed for wheezing or shortness of breath. Use with spacer What changed:   how much to take  when to take this  additional instructions   alum & mag hydroxide-simeth 200-200-20 MG/5ML suspension Commonly known as: MAALOX/MYLANTA Take 30 mLs by mouth once as needed for indigestion or heartburn.   aspirin 81 MG tablet Take 81 mg by mouth daily. AM   bismuth subsalicylate 656 CL/27NT suspension Commonly known as: PEPTO BISMOL Take 15 mLs by mouth See admin instructions. Take 1 tablespoonful by mouth every 2 hours as needed for up to 6 doses in 24 hours as needed.   calcium carbonate 1500 (600 Ca) MG Tabs tablet Commonly known as: OSCAL Take 1 tablet by mouth 2 (two) times daily.   Cholecalciferol 25 MCG (1000 UT) capsule Take 1,000 Units by mouth daily.   escitalopram 20 MG tablet Commonly known as: LEXAPRO Take 1 tablet (20 mg total) by mouth daily. AM   gabapentin 100 MG capsule Commonly known as: NEURONTIN Take 1 capsule (100 mg total) by mouth 2 (two) times daily. What changed: when to take this   linagliptin 5 MG Tabs tablet Commonly  known as: TRADJENTA Take by mouth.   lisinopril-hydrochlorothiazide 20-12.5 MG tablet Commonly known as: ZESTORETIC Take 1 tablet by mouth daily.   loperamide 2 MG capsule Commonly known as: IMODIUM Take 2 mg by mouth See admin instructions. Take 1 capsule after each loose stool as needed for up to 4 doses in 12 hours.   magnesium hydroxide 400 MG/5ML suspension Commonly known as: MILK OF MAGNESIA Take 30 mLs by mouth once as needed for mild constipation.   memantine 10 MG tablet Commonly known as: NAMENDA Take 1 tablet (10 mg total) by mouth daily. PM   ROBITUSSIN COUGH/COLD CF PO Take 10 mLs by mouth every 6 (six) hours as needed. Up to 48 hours.   Thera Tabs Take 1 tablet by mouth daily.   VISION-VITE PRESERVE PO Take 1 tablet by mouth 2 (two) times daily.   vitamin B-12 500 MCG tablet Commonly known as: CYANOCOBALAMIN Take 500 mcg by mouth daily.       Allergies  Allergen Reactions  . Erythromycin Other (See Comments)  . Oxycodone-Acetaminophen Nausea And Vomiting  . Oxycodone-Acetaminophen Nausea And Vomiting  . Tapentadol Other (See Comments)  . Tapentadol Hcl Nausea And Vomiting  . Amoxicillin Nausea Only  . Atorvastatin   . Ezetimibe     Other reaction(s): Unknown  . Glucosamine-Chondroitin     Other reaction(s):  Unknown  . Lovastatin Other (See Comments)  . Omeprazole Nausea Only  . Azithromycin Nausea Only  . Erythromycin Base Rash  . Heparin Rash and Other (See Comments)    Other reaction(s): Other (See Comments)  . Other Rash    Consultations:  None   Procedures/Studies: DG Chest 2 View  Result Date: 10/16/2020 CLINICAL DATA:  Weakness EXAM: CHEST - 2 VIEW COMPARISON:  01/16/2019 FINDINGS: Mild left basilar scarring. Lungs are otherwise clear. No pneumothorax or pleural effusion. Moderate hiatal hernia. Cardiac size within normal limits. No acute bone abnormality. IMPRESSION: No active cardiopulmonary disease. Electronically Signed   By:  Fidela Salisbury MD   On: 10/16/2020 22:32   CT Head Wo Contrast  Result Date: 10/16/2020 CLINICAL DATA:  Generalized weakness EXAM: CT HEAD WITHOUT CONTRAST TECHNIQUE: Contiguous axial images were obtained from the base of the skull through the vertex without intravenous contrast. COMPARISON:  CT brain 05/07/2014 FINDINGS: Brain: No acute territorial infarction, hemorrhage or intracranial mass. Moderate atrophy. Marked hypodensity in the white matter consistent with chronic small vessel ischemic change. Stable ventricle size. Vascular: No hyperdense vessels.  Carotid vascular calcification Skull: Normal. Negative for fracture or focal lesion. Right frontal burr hole. Sinuses/Orbits: No acute finding. Other: None IMPRESSION: 1. No CT evidence for acute intracranial abnormality. 2. Atrophy and chronic small vessel ischemic changes of the white matter. Electronically Signed   By: Donavan Foil M.D.   On: 10/16/2020 22:46    (Echo, Carotid, EGD, Colonoscopy, ERCP)    Subjective: Patient seen and examined on day of discharge.  Stable, no distress.  Stable for discharge back to assisted living facility.  Home health PT and OT ordered  Discharge Exam: Vitals:   10/18/20 0450 10/18/20 0852  BP: (!) 151/68 (!) 142/69  Pulse: 69 64  Resp: 18 18  Temp: 97.9 F (36.6 C) 98.4 F (36.9 C)  SpO2: 93% 98%   Vitals:   10/17/20 2045 10/18/20 0244 10/18/20 0450 10/18/20 0852  BP: 131/67 (!) 136/55 (!) 151/68 (!) 142/69  Pulse: 73 66 69 64  Resp:  18 18 18   Temp: 98.2 F (36.8 C) 98.1 F (36.7 C) 97.9 F (36.6 C) 98.4 F (36.9 C)  TempSrc: Oral     SpO2: 92% 95% 93% 98%  Weight:      Height:        General: Pt is alert, awake, not in acute distress Cardiovascular: RRR, S1/S2 +, no rubs, no gallops Respiratory: CTA bilaterally, no wheezing, no rhonchi Abdominal: Soft, NT, ND, bowel sounds + Extremities: no edema, no cyanosis    The results of significant diagnostics from this  hospitalization (including imaging, microbiology, ancillary and laboratory) are listed below for reference.     Microbiology: Recent Results (from the past 240 hour(s))  SARS Coronavirus 2 by RT PCR (hospital order, performed in [facility] hospital lab) Nasopharyngeal Nasopharyngeal Swab     Status: None   Collection Time: 10/16/20  8:17 PM   Specimen: Nasopharyngeal Swab  Result Value Ref Range Status   SARS Coronavirus 2 NEGATIVE NEGATIVE Final    Comment: (NOTE) SARS-CoV-2 target nucleic acids are NOT DETECTED.  The SARS-CoV-2 RNA is generally detectable in upper and lower respiratory specimens during the acute phase of infection. The lowest concentration of SARS-CoV-2 viral copies this assay can detect is 250 copies / mL. A negative result does not preclude SARS-CoV-2 infection and should not be used as the sole basis for treatment or other patient management decisions.  A negative result may occur with improper specimen collection / handling, submission of specimen other than nasopharyngeal swab, presence of viral mutation(s) within the areas targeted by this assay, and inadequate number of viral copies (<250 copies / mL). A negative result must be combined with clinical observations, patient history, and epidemiological information.  Fact Sheet for Patients:   StrictlyIdeas.no  Fact Sheet for Healthcare Providers: BankingDealers.co.za  This test is not yet approved or  cleared by the [place] FDA and has been authorized for detection and/or diagnosis of SARS-CoV-2 by FDA under an Emergency Use Authorization (EUA).  This EUA will remain in effect (meaning this test can be used) for the duration of the COVID-19 declaration under Section 564(b)(1) of the Act, 21 U.S.C. section 360bbb-3(b)(1), unless the authorization is terminated or revoked sooner.  Performed at [facility], [address], [place], [place]  [address]   Urine culture     Status: None (Preliminary result)   Collection Time: 10/16/20  9:08 PM   Specimen: Urine, Random  Result Value Ref Range Status   Specimen Description   Final    URINE, RANDOM Performed at [facility], [address] [place] [address], [place], [place] [address]    Special Requests   Final    NONE Performed at [facility], [address] [place], [place] [address]    Culture   Final    CULTURE REINCUBATED FOR BETTER GROWTH Performed at [facility], [place] [address] [place], [place] [address]    Report Status PENDING  Incomplete  CULTURE, BLOOD (ROUTINE X 2) w Reflex to ID Panel     Status: None (Preliminary result)   Collection Time: 10/17/20  1:42 AM   Specimen: BLOOD  Result Value Ref Range Status   Specimen Description BLOOD BLOOD LEFT WRIST  Final   Special Requests   Final    BOTTLES DRAWN AEROBIC AND ANAEROBIC Blood Culture adequate volume   Culture   Final    NO GROWTH 1 DAY Performed at [facility], [address], [place], [place] [address]    Report Status PENDING  Incomplete  CULTURE, BLOOD (ROUTINE X 2) w Reflex to ID Panel     Status: None (Preliminary result)   Collection Time: 10/17/20  1:42 AM   Specimen: BLOOD  Result Value Ref Range Status   Specimen Description BLOOD BLOOD RIGHT HAND  Final   Special Requests   Final    BOTTLES DRAWN AEROBIC AND ANAEROBIC Blood Culture adequate volume   Culture   Final    NO GROWTH 1 DAY Performed at [facility], [address] [place], [place] [address]    Report Status PENDING  Incomplete  MRSA PCR Screening     Status: None   Collection Time: 10/17/20  3:04 PM   Specimen: Nasal Mucosa; Nasopharyngeal  Result Value Ref Range Status   MRSA by PCR NEGATIVE NEGATIVE Final    Comment:        The GeneXpert MRSA Assay (FDA approved for NASAL specimens only), is one component of a comprehensive MRSA colonization surveillance program. It is not intended to  diagnose MRSA infection nor to guide or monitor treatment for MRSA infections. Performed at [facility], [address] [place], [place] [address]      Labs: BNP (last 3 results) No results for input(s): BNP in the last 8760 hours. Basic Metabolic Panel: Recent Labs  Lab 10/16/20 1205 10/17/20 0452 10/18/20 0847  NA  141 138 140  K 4.7 4.1 4.5  CL 102 105 104  CO2 27 27 27   GLUCOSE 134* 110* 130*  BUN 30* 26* 23  CREATININE 1.07* 0.91 0.92  CALCIUM 9.2 8.3* 8.5*  MG  --   --  2.0   Liver Function Tests: No results for input(s): AST, ALT, ALKPHOS, BILITOT, PROT, ALBUMIN in the last 168 hours. No results for input(s): LIPASE, AMYLASE in the last 168 hours. No results for input(s): AMMONIA in the last 168 hours. CBC: Recent Labs  Lab 10/16/20 1205 10/17/20 0452 10/18/20 0847  WBC 5.9 5.3 5.4  NEUTROABS  --   --  3.3  HGB 10.6* 9.3* 9.9*  HCT 34.1* 29.5* 31.4*  MCV 95.3 94.2 94.9  PLT 314 257 270   Cardiac Enzymes: No results for input(s): CKTOTAL, CKMB, CKMBINDEX, TROPONINI in the last 168 hours. BNP: Invalid input(s): POCBNP CBG: Recent Labs  Lab 10/16/20 1159  GLUCAP 136*   D-Dimer No results for input(s): DDIMER in the last 72 hours. Hgb A1c No results for input(s): HGBA1C in the last 72 hours. Lipid Profile No results for input(s): CHOL, HDL, LDLCALC, TRIG, CHOLHDL, LDLDIRECT in the last 72 hours. Thyroid function studies Recent Labs    10/17/20 0452  TSH 1.364   Anemia work up Recent Labs    10/17/20 0452  VITAMINB12 701   Urinalysis    Component Value Date/Time   COLORURINE YELLOW (A) 10/16/2020 2108   APPEARANCEUR HAZY (A) 10/16/2020 2108   APPEARANCEUR Clear 05/07/2014 1258   LABSPEC 1.016 10/16/2020 2108   LABSPEC 1.014 05/07/2014 1258   PHURINE 5.0 10/16/2020 2108   GLUCOSEU NEGATIVE 10/16/2020 2108   GLUCOSEU Negative 05/07/2014 [place] 10/16/2020 2108   BILIRUBINUR NEGATIVE 10/16/2020 2108    BILIRUBINUR Negative 05/07/2014 [place] 10/16/2020 2108   PROTEINUR NEGATIVE 10/16/2020 2108   NITRITE NEGATIVE 10/16/2020 2108   LEUKOCYTESUR SMALL (A) 10/16/2020 2108   LEUKOCYTESUR Negative 05/07/2014 1258   Sepsis Labs Invalid input(s): PROCALCITONIN,  WBC,  LACTICIDVEN Microbiology Recent Results (from the past 240 hour(s))  SARS Coronavirus 2 by RT PCR (hospital order, performed in [place] hospital lab) Nasopharyngeal Nasopharyngeal Swab     Status: None   Collection Time: 10/16/20  8:17 PM   Specimen: Nasopharyngeal Swab  Result Value Ref Range Status   SARS Coronavirus 2 NEGATIVE NEGATIVE Final    Comment: (NOTE) SARS-CoV-2 target nucleic acids are NOT DETECTED.  The SARS-CoV-2 RNA is generally detectable in upper and lower respiratory specimens during the acute phase of infection. The lowest concentration of SARS-CoV-2 viral copies this assay can detect is 250 copies / mL. A negative result does not preclude SARS-CoV-2 infection and should not be used as the sole basis for treatment or other patient management decisions.  A negative result may occur with improper specimen collection / handling, submission of specimen other than nasopharyngeal swab, presence of viral mutation(s) within the areas targeted by this assay, and inadequate number of viral copies (<250 copies / mL). A negative result must be combined with clinical observations, patient history, and epidemiological information.  Fact Sheet for Patients:   StrictlyIdeas.no  Fact Sheet for Healthcare Providers: BankingDealers.co.za  This test is not yet approved or  cleared by the [place] FDA and has been authorized for detection and/or diagnosis of SARS-CoV-2 by FDA under an Emergency Use Authorization (EUA).  This EUA will remain in effect (meaning this test can be used) for  the duration of the COVID-19 declaration under Section  564(b)(1) of the Act, 21 U.S.C. section 360bbb-3(b)(1), unless the authorization is terminated or revoked sooner.  Performed at [facility], [address] [place], [place] [address]   Urine culture     Status: None (Preliminary result)   Collection Time: 10/16/20  9:08 PM   Specimen: Urine, Random  Result Value Ref Range Status   Specimen Description   Final    URINE, RANDOM Performed at [facility], [address], [place], [place] [address]    Special Requests   Final    NONE Performed at [facility], [address] [place], [place] [address]    Culture   Final    CULTURE REINCUBATED FOR BETTER GROWTH Performed at [facility], [place] [address], [place], [place] [address]    Report Status PENDING  Incomplete  CULTURE, BLOOD (ROUTINE X 2) w Reflex to ID Panel     Status: None (Preliminary result)   Collection Time: 10/17/20  1:42 AM   Specimen: BLOOD  Result Value Ref Range Status   Specimen Description BLOOD BLOOD LEFT WRIST  Final   Special Requests   Final    BOTTLES DRAWN AEROBIC AND ANAEROBIC Blood Culture adequate volume   Culture   Final    NO GROWTH 1 DAY Performed at [facility], [address] [place], [place] [address]    Report Status PENDING  Incomplete  CULTURE, BLOOD (ROUTINE X 2) w Reflex to ID Panel     Status: None (Preliminary result)   Collection Time: 10/17/20  1:42 AM   Specimen: BLOOD  Result Value Ref Range Status   Specimen Description BLOOD BLOOD RIGHT HAND  Final   Special Requests   Final    BOTTLES DRAWN AEROBIC AND ANAEROBIC Blood Culture adequate volume   Culture   Final    NO GROWTH 1 DAY Performed at [facility], [address], [place], [place] [address]    Report Status PENDING  Incomplete  MRSA PCR Screening     Status: None   Collection Time: 10/17/20  3:04 PM   Specimen: Nasal Mucosa; Nasopharyngeal  Result Value Ref Range Status   MRSA by PCR NEGATIVE NEGATIVE  Final    Comment:        The GeneXpert MRSA Assay (FDA approved for NASAL specimens only), is one component of a comprehensive MRSA colonization surveillance program. It is not intended to diagnose MRSA infection nor to guide or monitor treatment for MRSA infections. Performed at [facility], [address], [place], [place] [address]      Time coordinating discharge: Over 30 minutes  SIGNED:   Sidney Ace, MD  [facility] 10/18/2020, 10:52 AM Pager   If 7PM-7AM, please contact night-coverage

## 2020-10-18 NOTE — NC FL2 (Signed)
[place] LEVEL OF CARE SCREENING TOOL     IDENTIFICATION  Patient Name: Lori Gilmore Birthdate: 02-Feb-1934 Sex: female Admission Date (Current Location): 10/16/2020  [place] and [place] Number:  Engineering geologist and Address:  [facility], [address] [place], [place] [address]      Provider Number: [id number]  Attending Physician Name and Address:  Sidney Ace, MD  Relative Name and Phone Number:  Arn Medal [phone]    Current Level of Care: Hospital Recommended Level of Care: Memory Care Prior Approval Number:    Date Approved/Denied:   PASRR Number:    Discharge Plan: Other (Comment) (Memory Care)    Current Diagnoses: Patient Active Problem List   Diagnosis Date Noted  . Weakness 10/16/2020  . Personal history of renal cell carcinoma 11/22/2016  . Incomplete immunization status 04/08/2016  . DDD (degenerative disc disease), cervical 04/02/2016  . Major depressive disorder, recurrent episode, in full remission ([place]) 01/02/2016  . Osteoporosis, post-menopausal 12/10/2015  . Breast lump 11/19/2015  . Other long term (current) drug therapy 05/03/2015  . High risk medication use 05/03/2015  . Mild cognitive disorder 10/31/2014  . Hemorrhage of vagina 06/15/2014  . Severe episode of recurrent major depressive disorder ([place]) 05/08/2014  . History of urinary anomaly 04/30/2014  . Personal history of urinary infection 04/30/2014  . History of recurrent UTIs 04/30/2014  . Anemia in chronic illness 04/29/2014  . Absolute anemia 04/29/2014  . Benign essential HTN 04/29/2014  . Chronic kidney disease (CKD), stage III (moderate) ([place]) 02/06/2014  . B12 deficiency 02/06/2014  . Anxiety and depression 02/06/2014  . Hypercholesterolemia 02/06/2014  . Mixed anxiety depressive disorder 02/06/2014  . Fall 02/21/2013  . Malaise and fatigue 02/21/2013  . Adynamia 02/21/2013  . Bladder pain 02/20/2013  .  Difficult or painful urination 02/20/2013  . Frank hematuria 02/20/2013  . Labial cyst 02/20/2013  . Malignant neoplasm of kidney ([place]) 02/20/2013  . Urge incontinence of urine 02/20/2013  . Renal cell carcinoma ([place]) 02/20/2013  . Diabetes mellitus ([place]) 06/06/2010  . Benign hypertension 06/06/2010  . Polyneuropathy in diseases classified elsewhere ([place]) 06/06/2010  . Neuropathy 06/06/2010  . Mononeuritis 06/06/2010    Orientation RESPIRATION BLADDER Height & Weight     Self,Time,Place  Normal Continent Weight: 64 kg Height:  5\' 8"  (172.7 cm)  BEHAVIORAL SYMPTOMS/MOOD NEUROLOGICAL BOWEL NUTRITION STATUS      Continent Diet (Heart Healthy, Carb Modified)  AMBULATORY STATUS COMMUNICATION OF NEEDS Skin   Supervision Verbally Normal                       Personal Care Assistance Level of Assistance  Bathing,Feeding,Dressing Bathing Assistance: Limited assistance Feeding assistance: Independent Dressing Assistance: Limited assistance     Functional Limitations Info             SPECIAL CARE FACTORS FREQUENCY  PT (By licensed PT),OT (By licensed OT)                    Contractures Contractures Info: Not present    Additional Factors Info  Code Status,Allergies Code Status Info: DNR Allergies Info: Erythromycin, Oxycodone-APAP, Tapentadol, Amoxicillin, Atorvastatin, Ezetimibe, Glucosamine-chondroitin, Lovastatin, Omeprazole, Azithromycin, Erythromycin, Heparin           Current Medications (10/18/2020):   Discharge Medications: TAKE these medications   Accu-Chek Softclix Lancets lancets   acetaminophen 325 MG tablet Commonly known as: TYLENOL Take 650 mg by mouth every  4 (four) hours as needed for headache, fever or mild pain. Up to 24 hours   albuterol 108 (90 Base) MCG/ACT inhaler Commonly known as: VENTOLIN HFA Inhale 1-2 puffs into the lungs every 6 (six) hours as needed for wheezing or shortness of breath. Use with spacer What changed:    how much to take  when to take this  additional instructions   alum & mag hydroxide-simeth 200-200-20 MG/5ML suspension Commonly known as: MAALOX/MYLANTA Take 30 mLs by mouth once as needed for indigestion or heartburn.   aspirin 81 MG tablet Take 81 mg by mouth daily. AM   bismuth subsalicylate 267 TI/45YK suspension Commonly known as: PEPTO BISMOL Take 15 mLs by mouth See admin instructions. Take 1 tablespoonful by mouth every 2 hours as needed for up to 6 doses in 24 hours as needed.   calcium carbonate 1500 (600 Ca) MG Tabs tablet Commonly known as: OSCAL Take 1 tablet by mouth 2 (two) times daily.   Cholecalciferol 25 MCG (1000 UT) capsule Take 1,000 Units by mouth daily.   escitalopram 20 MG tablet Commonly known as: LEXAPRO Take 1 tablet (20 mg total) by mouth daily. AM   gabapentin 100 MG capsule Commonly known as: NEURONTIN Take 1 capsule (100 mg total) by mouth 2 (two) times daily. What changed: when to take this   linagliptin 5 MG Tabs tablet Commonly known as: TRADJENTA Take by mouth.   lisinopril-hydrochlorothiazide 20-12.5 MG tablet Commonly known as: ZESTORETIC Take 1 tablet by mouth daily.   loperamide 2 MG capsule Commonly known as: IMODIUM Take 2 mg by mouth See admin instructions. Take 1 capsule after each loose stool as needed for up to 4 doses in 12 hours.   magnesium hydroxide 400 MG/5ML suspension Commonly known as: MILK OF MAGNESIA Take 30 mLs by mouth once as needed for mild constipation.   memantine 10 MG tablet Commonly known as: NAMENDA Take 1 tablet (10 mg total) by mouth daily. PM   ROBITUSSIN COUGH/COLD CF PO Take 10 mLs by mouth every 6 (six) hours as needed. Up to 48 hours.   Thera Tabs Take 1 tablet by mouth daily.   VISION-VITE PRESERVE PO Take 1 tablet by mouth 2 (two) times daily.   vitamin B-12 500 MCG tablet Commonly known as: CYANOCOBALAMIN Take 500 mcg by mouth daily.        Relevant  Imaging Results:  Relevant Lab Results:   Additional Information    Shelbie Ammons, RN

## 2020-10-18 NOTE — Discharge Instructions (Signed)
Urinary Tract Infection, Adult  A urinary tract infection (UTI) is an infection of any part of the urinary tract. The urinary tract includes the kidneys, ureters, bladder, and urethra. These organs make, store, and get rid of urine in the body. An upper UTI affects the ureters and kidneys. A lower UTI affects the bladder and urethra. What are the causes? Most urinary tract infections are caused by bacteria in your genital area around your urethra, where urine leaves your body. These bacteria grow and cause inflammation of your urinary tract. What increases the risk? You are more likely to develop this condition if:  You have a urinary catheter that stays in place.  You are not able to control when you urinate or have a bowel movement (incontinence).  You are female and you: ? Use a spermicide or diaphragm for birth control. ? Have low estrogen levels. ? Are pregnant.  You have certain genes that increase your risk.  You are sexually active.  You take antibiotic medicines.  You have a condition that causes your flow of urine to slow down, such as: ? An enlarged prostate, if you are female. ? Blockage in your urethra. ? A kidney stone. ? A nerve condition that affects your bladder control (neurogenic bladder). ? Not getting enough to drink, or not urinating often.  You have certain medical conditions, such as: ? Diabetes. ? A weak disease-fighting system (immunesystem). ? Sickle cell disease. ? Gout. ? Spinal cord injury. What are the signs or symptoms? Symptoms of this condition include:  Needing to urinate right away (urgency).  Frequent urination. This may include small amounts of urine each time you urinate.  Pain or burning with urination.  Blood in the urine.  Urine that smells bad or unusual.  Trouble urinating.  Cloudy urine.  Vaginal discharge, if you are female.  Pain in the abdomen or the lower back. You may also have:  Vomiting or a decreased  appetite.  Confusion.  Irritability or tiredness.  A fever or chills.  Diarrhea. The first symptom in older adults may be confusion. In some cases, they may not have any symptoms until the infection has worsened. How is this diagnosed? This condition is diagnosed based on your medical history and a physical exam. You may also have other tests, including:  Urine tests.  Blood tests.  Tests for STIs (sexually transmitted infections). If you have had more than one UTI, a cystoscopy or imaging studies may be done to determine the cause of the infections. How is this treated? Treatment for this condition includes:  Antibiotic medicine.  Over-the-counter medicines to treat discomfort.  Drinking enough water to stay hydrated. If you have frequent infections or have other conditions such as a kidney stone, you may need to see a health care provider who specializes in the urinary tract (urologist). In rare cases, urinary tract infections can cause sepsis. Sepsis is a life-threatening condition that occurs when the body responds to an infection. Sepsis is treated in the hospital with IV antibiotics, fluids, and other medicines. Follow these instructions at home: Medicines  Take over-the-counter and prescription medicines only as told by your health care provider.  If you were prescribed an antibiotic medicine, take it as told by your health care provider. Do not stop using the antibiotic even if you start to feel better. General instructions  Make sure you: ? Empty your bladder often and completely. Do not hold urine for long periods of time. ? Empty your bladder after   sex. ? Wipe from front to back after urinating or having a bowel movement if you are female. Use each tissue only one time when you wipe.  Drink enough fluid to keep your urine pale yellow.  Keep all follow-up visits. This is important.   Contact a health care provider if:  Your symptoms do not get better after 1-2  days.  Your symptoms go away and then return. Get help right away if:  You have severe pain in your back or your lower abdomen.  You have a fever or chills.  You have nausea or vomiting. Summary  A urinary tract infection (UTI) is an infection of any part of the urinary tract, which includes the kidneys, ureters, bladder, and urethra.  Most urinary tract infections are caused by bacteria in your genital area.  Treatment for this condition often includes antibiotic medicines.  If you were prescribed an antibiotic medicine, take it as told by your health care provider. Do not stop using the antibiotic even if you start to feel better.  Keep all follow-up visits. This is important. This information is not intended to replace advice given to you by your health care provider. Make sure you discuss any questions you have with your health care provider. Document Revised: 04/19/2020 Document Reviewed: 04/19/2020 Elsevier Patient Education  2021 Elsevier Inc.  

## 2020-10-18 NOTE — Progress Notes (Signed)
Patients daughter at bedside to take patient to ALF. Facility aware, patients daughter and facility provided with AVS. No questions comments or concerns. IV removed.

## 2020-10-19 LAB — URINE CULTURE

## 2020-10-22 LAB — CULTURE, BLOOD (ROUTINE X 2)
Culture: NO GROWTH
Culture: NO GROWTH
Special Requests: ADEQUATE
Special Requests: ADEQUATE

## 2021-04-08 ENCOUNTER — Other Ambulatory Visit: Payer: Self-pay | Admitting: *Deleted

## 2021-04-08 ENCOUNTER — Ambulatory Visit (INDEPENDENT_AMBULATORY_CARE_PROVIDER_SITE_OTHER): Payer: Medicare Other | Admitting: Urology

## 2021-04-08 ENCOUNTER — Encounter: Payer: Self-pay | Admitting: Urology

## 2021-04-08 ENCOUNTER — Other Ambulatory Visit: Payer: Self-pay

## 2021-04-08 ENCOUNTER — Other Ambulatory Visit
Admission: RE | Admit: 2021-04-08 | Discharge: 2021-04-08 | Disposition: A | Discharge status: Home / Self Care | Payer: Medicare Other | Attending: Urology | Admitting: Urology

## 2021-04-08 VITALS — BP 150/78 | HR 79 | Ht 68.0 in | Wt 138.0 lb

## 2021-04-08 DIAGNOSIS — N390 Urinary tract infection, site not specified: Secondary | ICD-10-CM

## 2021-04-08 DIAGNOSIS — C641 Malignant neoplasm of right kidney, except renal pelvis: Secondary | ICD-10-CM

## 2021-04-08 LAB — URINALYSIS, COMPLETE (UACMP) WITH MICROSCOPIC
Bilirubin Urine: NEGATIVE
Glucose, UA: NEGATIVE mg/dL
Ketones, ur: NEGATIVE mg/dL
Nitrite: NEGATIVE
Protein, ur: NEGATIVE mg/dL
Specific Gravity, Urine: 1.015 (ref 1.005–1.030)
pH: 7 (ref 5.0–8.0)

## 2021-04-08 MED ORDER — ESTRADIOL 0.1 MG/GM VA CREA: TOPICAL_CREAM | VAGINAL | 12 refills | Status: DC

## 2021-04-08 NOTE — Progress Notes (Signed)
04/08/21 11:03 AM   Lori Gilmore [birth year] [id number]  CC: Recurrent UTIs, history of RCC  HPI: I saw [name] and her daughter for the above issues today.  She is an 87 year old female with dementia who lives in an assisted living facility who was referred for recurrent UTIs.  She also has a history of right RCC status post cryoablation in 2011.  She was also followed by Dr. Jacqlyn Larsen, and no longer required routine surveillance.  She reports 2-3 UTIs a year with symptoms of dysuria, urgency/frequency, and pelvic pain.  Most recent UTI that I can see was Klebsiella in May 2022.  She denies any UTI symptoms today.  She denies any gross hematuria.  PVR is normal at 0 mL.  No recent cross-sectional imaging to review.  No UTI symptoms today.  Urinalysis today with 6-10 squamous cells, 11-20 WBCs and clumps, few bacteria, moderate leukocytes, nitrite negative.  We will not send for culture in the setting of no symptoms and urine contamination.  PMH: Past Medical History:  Diagnosis Date   Anemia    Arthritis    right knee   Blood clots in brain    Depression    Diabetes mellitus without complication ([place])    Diet controlled   Headache    sinus   History of hiatal hernia    Hypercholesteremia    Hypertension    Renal cancer ([place])    right kidney   Seasonal allergies    TIA (transient ischemic attack) 2000   no deficits   Wears dentures    full upper and lower    Surgical History: Past Surgical History:  Procedure Laterality Date   ABDOMINAL HYSTERECTOMY     APPENDECTOMY     BRAIN SURGERY     for blood clot   BREAST BIOPSY Left 03/21/13   u/s bx-neg   BREAST BIOPSY Left    neg   BREAST CYST ASPIRATION Left    neg   CATARACT EXTRACTION W/PHACO Right 02/13/2015   Procedure: CATARACT EXTRACTION PHACO AND INTRAOCULAR LENS PLACEMENT ([place]);  Surgeon: Leandrew Koyanagi, MD;  Location: [facility];  Service: Ophthalmology;  Laterality: Right;  DIABETIC   CATARACT  EXTRACTION W/PHACO Left 03/20/2015   Procedure: CATARACT EXTRACTION PHACO AND INTRAOCULAR LENS PLACEMENT ([facility]);  Surgeon: Leandrew Koyanagi, MD;  Location: [facility];  Service: Ophthalmology;  Laterality: Left;   CHOLECYSTECTOMY       Family History: Family History  Problem Relation Age of Onset   Stroke Mother    Hypertension Mother    Hypertension Father    Diabetes Daughter    Breast cancer Neg Hx     Social History:  reports that she has never smoked. She has never used smokeless tobacco. She reports that she does not drink alcohol and does not use drugs.  Physical Exam: BP (!) 150/78   Pulse 79   Ht 5\' 8"  (1.727 m)   Wt 138 lb (62.6 kg)   BMI 20.98 kg/m    Constitutional: Very pleasantly confused, No acute distress. Cardiovascular: No clubbing, cyanosis, or edema. Respiratory: Normal respiratory effort, no increased work of breathing. GI: Abdomen is soft, nontender, nondistended, no abdominal masses   Laboratory Data: Reviewed, see HPI  Pertinent Imaging: None to review  Assessment & Plan:   87 year old female with recurrent UTIs and distant history of right RCC status post cryoablation.  No further surveillance needed from [facility] perspective.  We discussed the evaluation and treatment of patients  with recurrent UTIs at length.  We specifically discussed the differences between asymptomatic bacteriuria and true urinary tract infection.  We discussed the AUA definition of recurrent UTI of at least 2 culture proven symptomatic acute cystitis episodes in a 42-month period, or 3 within a 1 year period.  We discussed the importance of culture directed antibiotic treatment, and antibiotic stewardship.  First-line therapy includes nitrofurantoin(5 days), Bactrim(3 days), or fosfomycin(3 g single dose).  Possible etiologies of recurrent infection include periurethral tissue atrophy in postmenopausal woman, constipation, sexual activity, incomplete emptying, anatomic  abnormalities, and even genetic predisposition.  Finally, we discussed the role of perineal hygiene, timed voiding, adequate hydration, topical vaginal estrogen, cranberry prophylaxis, and low-dose antibiotic prophylaxis.  Trial of cranberry tablets and topical estrogen cream, RTC 6 months symptom check  Nickolas Madrid, MD 04/08/2021  [facility] [address] [place], [place] [address] [phone]

## 2021-04-08 NOTE — Patient Instructions (Addendum)
Take cranberry tablets over-the-counter twice daily to help prevent urinary infections.    We will also send a prescription for topical estrogen cream.  This can be applied around opening of the urethra(tube that you pee through) 3 times a week and can help prevent infections.  Urinary Tract Infection, Adult  A urinary tract infection (UTI) is an infection of any part of the urinary tract. The urinary tract includes the kidneys, ureters, bladder, and urethra.These organs make, store, and get rid of urine in the body. An upper UTI affects the ureters and kidneys. A lower UTI affects the bladderand urethra. What are the causes? Most urinary tract infections are caused by bacteria in your genital area around your urethra, where urine leaves your body. These bacteria grow andcause inflammation of your urinary tract. What increases the risk? You are more likely to develop this condition if: You have a urinary catheter that stays in place. You are not able to control when you urinate or have a bowel movement (incontinence). You are female and you: Use a spermicide or diaphragm for birth control. Have low estrogen levels. Are pregnant. You have certain genes that increase your risk. You are sexually active. You take antibiotic medicines. You have a condition that causes your flow of urine to slow down, such as: An enlarged prostate, if you are female. Blockage in your urethra. A kidney stone. A nerve condition that affects your bladder control (neurogenic bladder). Not getting enough to drink, or not urinating often. You have certain medical conditions, such as: Diabetes. A weak disease-fighting system (immunesystem). Sickle cell disease. Gout. Spinal cord injury. What are the signs or symptoms? Symptoms of this condition include: Needing to urinate right away (urgency). Frequent urination. This may include small amounts of urine each time you urinate. Pain or burning with  urination. Blood in the urine. Urine that smells bad or unusual. Trouble urinating. Cloudy urine. Vaginal discharge, if you are female. Pain in the abdomen or the lower back. You may also have: Vomiting or a decreased appetite. Confusion. Irritability or tiredness. A fever or chills. Diarrhea. The first symptom in older adults may be confusion. In some cases, they may nothave any symptoms until the infection has worsened. How is this diagnosed? This condition is diagnosed based on your medical history and a physical exam. You may also have other tests, including: Urine tests. Blood tests. Tests for STIs (sexually transmitted infections). If you have had more than one UTI, a cystoscopy or imaging studies may be doneto determine the cause of the infections. How is this treated? Treatment for this condition includes: Antibiotic medicine. Over-the-counter medicines to treat discomfort. Drinking enough water to stay hydrated. If you have frequent infections or have other conditions such as a kidney stone, you may need to see a health care provider who specializes in the urinary tract (urologist). In rare cases, urinary tract infections can cause sepsis. Sepsis is a life-threatening condition that occurs when the body responds to an infection. Sepsis is treated in the hospital with IV antibiotics, fluids, and othermedicines. Follow these instructions at home:  Medicines Take over-the-counter and prescription medicines only as told by your health care provider. If you were prescribed an antibiotic medicine, take it as told by your health care provider. Do not stop using the antibiotic even if you start to feel better. General instructions Make sure you: Empty your bladder often and completely. Do not hold urine for long periods of time. Empty your bladder after sex. Wipe from  front to back after urinating or having a bowel movement if you are female. Use each tissue only one time when  you wipe. Drink enough fluid to keep your urine pale yellow. Keep all follow-up visits. This is important. Contact a health care provider if: Your symptoms do not get better after 1-2 days. Your symptoms go away and then return. Get help right away if: You have severe pain in your back or your lower abdomen. You have a fever or chills. You have nausea or vomiting. Summary A urinary tract infection (UTI) is an infection of any part of the urinary tract, which includes the kidneys, ureters, bladder, and urethra. Most urinary tract infections are caused by bacteria in your genital area. Treatment for this condition often includes antibiotic medicines. If you were prescribed an antibiotic medicine, take it as told by your health care provider. Do not stop using the antibiotic even if you start to feel better. Keep all follow-up visits. This is important. This information is not intended to replace advice given to you by your health care provider. Make sure you discuss any questions you have with your healthcare provider. Document Revised: 04/19/2020 Document Reviewed: 04/19/2020 Elsevier Patient Education  [place].

## 2021-10-14 ENCOUNTER — Encounter: Payer: Self-pay | Admitting: Urology

## 2021-10-14 ENCOUNTER — Ambulatory Visit (INDEPENDENT_AMBULATORY_CARE_PROVIDER_SITE_OTHER): Payer: Medicare Other | Admitting: Urology

## 2021-10-14 ENCOUNTER — Other Ambulatory Visit: Payer: Self-pay

## 2021-10-14 VITALS — BP 147/78 | HR 90 | Ht 68.0 in | Wt 127.0 lb

## 2021-10-14 DIAGNOSIS — N390 Urinary tract infection, site not specified: Secondary | ICD-10-CM

## 2021-10-14 DIAGNOSIS — Z85528 Personal history of other malignant neoplasm of kidney: Secondary | ICD-10-CM | POA: Diagnosis not present

## 2021-10-14 NOTE — Progress Notes (Signed)
° °  10/14/2021 12:38 PM   Lori Gilmore 07-05-1934 [id number]  Reason for visit: Follow up recurrent UTIs, history of RCC  HPI: I saw [name] and her daughter today for follow-up of the above issues, she has dementia and lives in an assisted living facility, and her daughter provides most of the history today.  She denies any major issues since her last visit.  In July 2022 we started her on topical estrogen cream and cranberry tablet prophylaxis for recurrent UTIs, and she has done very well on these medications.  She denies any infections over the last 6 months.  She has some mild urgency and frequency during the day, but denies any dysuria or gross hematuria or incontinence.  She had a history of right RCC and cryoablation in 2011 and was followed by Dr. Jacqlyn Larsen with no evidence of recurrence.  Surveillance was discontinued secondary to her age, comorbidities, and > 10 years from definitive treatment.  -Continue topical estrogen cream and cranberry tablet prophylaxis -RTC 1 year symptom check, can follow-up with PCP in the future if doing well  Nickolas Madrid, MD 10/14/2021    [facility], [facility] [address] [place], [place] [address] [phone]

## 2022-07-30 ENCOUNTER — Emergency Department: Payer: Medicare Other

## 2022-07-30 ENCOUNTER — Inpatient Hospital Stay
Admission: EM | Admit: 2022-07-30 | Discharge: 2022-08-21 | DRG: 228 | Disposition: E | Payer: Medicare Other | Attending: Hospitalist | Admitting: Hospitalist

## 2022-07-30 ENCOUNTER — Other Ambulatory Visit: Payer: Self-pay

## 2022-07-30 DIAGNOSIS — F418 Other specified anxiety disorders: Secondary | ICD-10-CM | POA: Diagnosis present

## 2022-07-30 DIAGNOSIS — M1711 Unilateral primary osteoarthritis, right knee: Secondary | ICD-10-CM | POA: Diagnosis present

## 2022-07-30 DIAGNOSIS — Z823 Family history of stroke: Secondary | ICD-10-CM

## 2022-07-30 DIAGNOSIS — I10 Essential (primary) hypertension: Secondary | ICD-10-CM | POA: Diagnosis present

## 2022-07-30 DIAGNOSIS — Z7722 Contact with and (suspected) exposure to environmental tobacco smoke (acute) (chronic): Secondary | ICD-10-CM | POA: Diagnosis present

## 2022-07-30 DIAGNOSIS — F0394 Unspecified dementia, unspecified severity, with anxiety: Secondary | ICD-10-CM | POA: Diagnosis present

## 2022-07-30 DIAGNOSIS — E872 Acidosis, unspecified: Secondary | ICD-10-CM | POA: Diagnosis present

## 2022-07-30 DIAGNOSIS — N183 Chronic kidney disease, stage 3 unspecified: Secondary | ICD-10-CM | POA: Diagnosis present

## 2022-07-30 DIAGNOSIS — E7800 Pure hypercholesterolemia, unspecified: Secondary | ICD-10-CM | POA: Diagnosis present

## 2022-07-30 DIAGNOSIS — I130 Hypertensive heart and chronic kidney disease with heart failure and stage 1 through stage 4 chronic kidney disease, or unspecified chronic kidney disease: Secondary | ICD-10-CM | POA: Diagnosis present

## 2022-07-30 DIAGNOSIS — J439 Emphysema, unspecified: Secondary | ICD-10-CM | POA: Diagnosis present

## 2022-07-30 DIAGNOSIS — I959 Hypotension, unspecified: Secondary | ICD-10-CM | POA: Insufficient documentation

## 2022-07-30 DIAGNOSIS — J9601 Acute respiratory failure with hypoxia: Secondary | ICD-10-CM | POA: Diagnosis present

## 2022-07-30 DIAGNOSIS — Z888 Allergy status to other drugs, medicaments and biological substances status: Secondary | ICD-10-CM

## 2022-07-30 DIAGNOSIS — Z8249 Family history of ischemic heart disease and other diseases of the circulatory system: Secondary | ICD-10-CM

## 2022-07-30 DIAGNOSIS — Z8673 Personal history of transient ischemic attack (TIA), and cerebral infarction without residual deficits: Secondary | ICD-10-CM | POA: Diagnosis not present

## 2022-07-30 DIAGNOSIS — I4430 Unspecified atrioventricular block: Secondary | ICD-10-CM

## 2022-07-30 DIAGNOSIS — E1122 Type 2 diabetes mellitus with diabetic chronic kidney disease: Secondary | ICD-10-CM | POA: Diagnosis present

## 2022-07-30 DIAGNOSIS — I5022 Chronic systolic (congestive) heart failure: Secondary | ICD-10-CM | POA: Insufficient documentation

## 2022-07-30 DIAGNOSIS — I469 Cardiac arrest, cause unspecified: Secondary | ICD-10-CM | POA: Diagnosis not present

## 2022-07-30 DIAGNOSIS — Z881 Allergy status to other antibiotic agents status: Secondary | ICD-10-CM

## 2022-07-30 DIAGNOSIS — I2510 Atherosclerotic heart disease of native coronary artery without angina pectoris: Secondary | ICD-10-CM | POA: Insufficient documentation

## 2022-07-30 DIAGNOSIS — K449 Diaphragmatic hernia without obstruction or gangrene: Secondary | ICD-10-CM | POA: Diagnosis present

## 2022-07-30 DIAGNOSIS — Z79899 Other long term (current) drug therapy: Secondary | ICD-10-CM | POA: Diagnosis not present

## 2022-07-30 DIAGNOSIS — Z66 Do not resuscitate: Secondary | ICD-10-CM | POA: Diagnosis present

## 2022-07-30 DIAGNOSIS — Z7982 Long term (current) use of aspirin: Secondary | ICD-10-CM

## 2022-07-30 DIAGNOSIS — Z85528 Personal history of other malignant neoplasm of kidney: Secondary | ICD-10-CM | POA: Diagnosis not present

## 2022-07-30 DIAGNOSIS — I214 Non-ST elevation (NSTEMI) myocardial infarction: Principal | ICD-10-CM

## 2022-07-30 DIAGNOSIS — Z006 Encounter for examination for normal comparison and control in clinical research program: Secondary | ICD-10-CM

## 2022-07-30 DIAGNOSIS — Z833 Family history of diabetes mellitus: Secondary | ICD-10-CM

## 2022-07-30 DIAGNOSIS — R55 Syncope and collapse: Secondary | ICD-10-CM | POA: Diagnosis present

## 2022-07-30 DIAGNOSIS — N1832 Chronic kidney disease, stage 3b: Secondary | ICD-10-CM | POA: Diagnosis present

## 2022-07-30 DIAGNOSIS — Z880 Allergy status to penicillin: Secondary | ICD-10-CM

## 2022-07-30 DIAGNOSIS — G63 Polyneuropathy in diseases classified elsewhere: Secondary | ICD-10-CM | POA: Diagnosis present

## 2022-07-30 DIAGNOSIS — E1142 Type 2 diabetes mellitus with diabetic polyneuropathy: Secondary | ICD-10-CM | POA: Diagnosis present

## 2022-07-30 DIAGNOSIS — F09 Unspecified mental disorder due to known physiological condition: Secondary | ICD-10-CM

## 2022-07-30 DIAGNOSIS — I459 Conduction disorder, unspecified: Secondary | ICD-10-CM | POA: Insufficient documentation

## 2022-07-30 DIAGNOSIS — Z7984 Long term (current) use of oral hypoglycemic drugs: Secondary | ICD-10-CM

## 2022-07-30 DIAGNOSIS — R7989 Other specified abnormal findings of blood chemistry: Secondary | ICD-10-CM | POA: Diagnosis present

## 2022-07-30 LAB — MRSA NEXT GEN BY PCR, NASAL: MRSA by PCR Next Gen: NOT DETECTED

## 2022-07-30 LAB — LACTIC ACID, PLASMA
Lactic Acid, Venous: 2.8 mmol/L (ref 0.5–1.9)
Lactic Acid, Venous: 1.7 mmol/L (ref 0.5–1.9)

## 2022-07-30 LAB — COMPREHENSIVE METABOLIC PANEL
ALT: 8 U/L (ref 0–44)
AST: 30 U/L (ref 15–41)
Albumin: 3.3 g/dL — ABNORMAL LOW (ref 3.5–5.0)
Alkaline Phosphatase: 45 U/L (ref 38–126)
Anion gap: 8 (ref 5–15)
BUN: 39 mg/dL — ABNORMAL HIGH (ref 8–23)
CO2: 24 mmol/L (ref 22–32)
Calcium: 9.1 mg/dL (ref 8.9–10.3)
Chloride: 108 mmol/L (ref 98–111)
Creatinine, Ser: 1.21 mg/dL — ABNORMAL HIGH (ref 0.44–1.00)
GFR, Estimated: 44 mL/min — ABNORMAL LOW (ref >60)
Glucose, Bld: 158 mg/dL — ABNORMAL HIGH (ref 70–99)
Potassium: 4.7 mmol/L (ref 3.5–5.1)
Sodium: 140 mmol/L (ref 135–145)
Total Bilirubin: 0.6 mg/dL (ref 0.3–1.2)
Total Protein: 6.3 g/dL — ABNORMAL LOW (ref 6.5–8.1)

## 2022-07-30 LAB — CBC WITH DIFFERENTIAL/PLATELET
Abs Immature Granulocytes: 0.02 10*3/uL (ref 0.00–0.07)
Basophils Absolute: 0 10*3/uL (ref 0.0–0.1)
Basophils Relative: 0 %
Eosinophils Absolute: 0.1 10*3/uL (ref 0.0–0.5)
Eosinophils Relative: 1 %
HCT: 33.3 % — ABNORMAL LOW (ref 36.0–46.0)
Hemoglobin: 10.6 g/dL — ABNORMAL LOW (ref 12.0–15.0)
Immature Granulocytes: 0 %
Lymphocytes Relative: 25 %
Lymphs Abs: 1.5 10*3/uL (ref 0.7–4.0)
MCH: 30.2 pg (ref 26.0–34.0)
MCHC: 31.8 g/dL (ref 30.0–36.0)
MCV: 94.9 fL (ref 80.0–100.0)
Monocytes Absolute: 0.7 10*3/uL (ref 0.1–1.0)
Monocytes Relative: 12 %
Neutro Abs: 3.9 10*3/uL (ref 1.7–7.7)
Neutrophils Relative %: 62 %
Platelets: 294 10*3/uL (ref 150–400)
RBC: 3.51 MIL/uL — ABNORMAL LOW (ref 3.87–5.11)
RDW: 13.3 % (ref 11.5–15.5)
WBC: 6.3 10*3/uL (ref 4.0–10.5)
nRBC: 0 % (ref 0.0–0.2)

## 2022-07-30 LAB — PROTIME-INR
INR: 1 (ref 0.8–1.2)
Prothrombin Time: 13.5 seconds (ref 11.4–15.2)

## 2022-07-30 LAB — TROPONIN I (HIGH SENSITIVITY)
Troponin I (High Sensitivity): 1574 ng/L (ref <18)
Troponin I (High Sensitivity): 4990 ng/L (ref <18)

## 2022-07-30 LAB — GLUCOSE, CAPILLARY: Glucose-Capillary: 150 mg/dL — ABNORMAL HIGH (ref 70–99)

## 2022-07-30 LAB — APTT: aPTT: 29 seconds (ref 24–36)

## 2022-07-30 LAB — LIPASE, BLOOD: Lipase: 38 U/L (ref 11–51)

## 2022-07-30 MED ORDER — ASPIRIN 81 MG PO TBEC: 81.0000 mg | DELAYED_RELEASE_TABLET | Freq: Every day | ORAL | Status: DC

## 2022-07-30 MED ORDER — MEMANTINE HCL 10 MG PO TABS
10.0000 mg | ORAL_TABLET | Freq: Every day | ORAL | Status: DC
  Administered 2022-07-30: 10 mg via ORAL
  Filled 2022-07-30: qty 1

## 2022-07-30 MED ORDER — GABAPENTIN 100 MG PO CAPS
100.0000 mg | ORAL_CAPSULE | Freq: Two times a day (BID) | ORAL | Status: DC
  Administered 2022-07-30: 100 mg via ORAL
  Filled 2022-07-30: qty 1

## 2022-07-30 MED ORDER — SODIUM CHLORIDE 0.9 % IV BOLUS
500.0000 mL | Freq: Once | INTRAVENOUS | Status: AC
  Administered 2022-07-30: 500 mL via INTRAVENOUS

## 2022-07-30 MED ORDER — LACTATED RINGERS IV SOLN: INTRAVENOUS | Status: AC

## 2022-07-30 MED ORDER — ONDANSETRON HCL 4 MG PO TABS: 4.0000 mg | ORAL_TABLET | Freq: Four times a day (QID) | ORAL | Status: DC | PRN

## 2022-07-30 MED ORDER — ACETAMINOPHEN 325 MG PO TABS
650.0000 mg | ORAL_TABLET | ORAL | Status: DC | PRN
  Administered 2022-07-30: 650 mg via ORAL
  Filled 2022-07-30: qty 2

## 2022-07-30 MED ORDER — LINAGLIPTIN 5 MG PO TABS
5.0000 mg | ORAL_TABLET | Freq: Every day | ORAL | Status: DC
  Filled 2022-07-30: qty 1

## 2022-07-30 MED ORDER — SODIUM CHLORIDE 0.9% FLUSH
3.0000 mL | Freq: Two times a day (BID) | INTRAVENOUS | Status: DC
  Administered 2022-07-30: 3 mL via INTRAVENOUS

## 2022-07-30 MED ORDER — SODIUM CHLORIDE 0.9 % IV BOLUS
1000.0000 mL | Freq: Once | INTRAVENOUS | Status: AC
  Administered 2022-07-30: 1000 mL via INTRAVENOUS

## 2022-07-30 MED ORDER — HEPARIN (PORCINE) 25000 UT/250ML-% IV SOLN
650.0000 [IU]/h | INTRAVENOUS | Status: DC
  Administered 2022-07-30: 650 [IU]/h via INTRAVENOUS
  Filled 2022-07-30: qty 250

## 2022-07-30 MED ORDER — FENTANYL CITRATE PF 50 MCG/ML IJ SOSY
12.5000 ug | PREFILLED_SYRINGE | INTRAMUSCULAR | Status: DC | PRN
  Administered 2022-07-30 – 2022-07-31 (×3): 12.5 ug via INTRAVENOUS
  Filled 2022-07-30 (×3): qty 1

## 2022-07-30 MED ORDER — ONDANSETRON HCL 4 MG/2ML IJ SOLN: 4.0000 mg | Freq: Four times a day (QID) | INTRAMUSCULAR | Status: DC | PRN

## 2022-07-30 MED ORDER — ESCITALOPRAM OXALATE 20 MG PO TABS
20.0000 mg | ORAL_TABLET | Freq: Every day | ORAL | Status: DC
  Filled 2022-07-30: qty 1

## 2022-07-30 MED ORDER — INSULIN ASPART 100 UNIT/ML IJ SOLN
0.0000 [IU] | Freq: Three times a day (TID) | INTRAMUSCULAR | Status: DC
  Administered 2022-07-31: 3 [IU] via SUBCUTANEOUS
  Filled 2022-07-30: qty 1

## 2022-07-30 MED ORDER — ONDANSETRON HCL 4 MG/2ML IJ SOLN
4.0000 mg | Freq: Four times a day (QID) | INTRAMUSCULAR | Status: DC | PRN
  Administered 2022-07-31: 4 mg via INTRAVENOUS
  Filled 2022-07-30: qty 2

## 2022-07-30 MED ORDER — HEPARIN BOLUS VIA INFUSION
3400.0000 [IU] | Freq: Once | INTRAVENOUS | Status: AC
  Administered 2022-07-30: 3400 [IU] via INTRAVENOUS
  Filled 2022-07-30: qty 3400

## 2022-07-30 MED ORDER — TICAGRELOR 90 MG PO TABS
90.0000 mg | ORAL_TABLET | Freq: Two times a day (BID) | ORAL | Status: DC
  Administered 2022-07-30: 90 mg via ORAL
  Filled 2022-07-30: qty 1

## 2022-07-30 MED ORDER — ASPIRIN 81 MG PO CHEW
324.0000 mg | CHEWABLE_TABLET | Freq: Once | ORAL | Status: AC
  Administered 2022-07-30: 324 mg via ORAL
  Filled 2022-07-30: qty 4

## 2022-07-30 MED ORDER — SENNOSIDES-DOCUSATE SODIUM 8.6-50 MG PO TABS
1.0000 | ORAL_TABLET | Freq: Two times a day (BID) | ORAL | Status: DC
  Administered 2022-07-30: 1 via ORAL
  Filled 2022-07-30: qty 1

## 2022-07-30 MED ORDER — NITROGLYCERIN 0.4 MG SL SUBL: 0.4000 mg | SUBLINGUAL_TABLET | SUBLINGUAL | Status: DC | PRN

## 2022-07-30 MED ORDER — MOMETASONE FURO-FORMOTEROL FUM 200-5 MCG/ACT IN AERO
2.0000 | INHALATION_SPRAY | Freq: Two times a day (BID) | RESPIRATORY_TRACT | Status: DC
  Filled 2022-07-30: qty 8.8

## 2022-07-30 NOTE — ED Provider Notes (Signed)
[facility] Provider Note    Event Date/Time   First MD Initiated Contact with Patient 07/29/2022 1433     (approximate)   History   Chief Complaint: Loss of Consciousness   HPI  Lori Gilmore is a 88 y.o. female with a past history of hypertension, CKD, diabetes who reports being in her usual state of health today until she went to the bathroom to have a bowel movement after which she got lightheaded diaphoretic and clammy, passed out onto her bed.  When she woke back up she was complaining of severe back pain.  Currently she endorses chest pain but cannot localize where it is.  She does have history of dementia.  EKG transmitted prehospital EKG which I reviewed showing minimal V3 and V4 ST elevation.  Code STEMI activated for urgent cardiology evaluation.     Physical Exam   Triage Vital Signs: ED Triage Vitals [08/16/2022 1316]  Enc Vitals Group     BP (!) 101/51     Pulse Rate 88     Resp 13     Temp 98.2 F (36.8 C)     Temp Source Oral     SpO2 98 %     Weight 125 lb (56.7 kg)     Height '5\' 8"'$  (1.727 m)     Head Circumference      Peak Flow      Pain Score 0     Pain Loc      Pain Edu?      Excl. in [place]?     Most recent vital signs: Vitals:   08/17/2022 1400 08/06/2022 1500  BP: (!) 109/46 (!) 107/56  Pulse: 91 95  Resp: 19 20  Temp:  98 F (36.7 C)  SpO2: 100% 96%    General: Awake, no distress.  CV:  Good peripheral perfusion.  Regular rate and rhythm, normal distal pulses Resp:  Normal effort.  Clear to auscultation bilaterally Abd:  No distention.  Soft nontender Other:  Clear speech.  No lower extremity edema   ED Results / Procedures / Treatments   Labs (all labs ordered are listed, but only abnormal results are displayed) Labs Reviewed  CBC WITH DIFFERENTIAL/PLATELET - Abnormal; Notable for the following components:      Result Value   RBC 3.51 (*)    Hemoglobin 10.6 (*)    HCT 33.3 (*)    All other components  within normal limits  LACTIC ACID, PLASMA - Abnormal; Notable for the following components:   Lactic Acid, Venous 2.8 (*)    All other components within normal limits  COMPREHENSIVE METABOLIC PANEL - Abnormal; Notable for the following components:   Glucose, Bld 158 (*)    BUN 39 (*)    Creatinine, Ser 1.21 (*)    Total Protein 6.3 (*)    Albumin 3.3 (*)    GFR, Estimated 44 (*)    All other components within normal limits  TROPONIN I (HIGH SENSITIVITY) - Abnormal; Notable for the following components:   Troponin I (High Sensitivity) 1,574 (*)    All other components within normal limits  CULTURE, BLOOD (ROUTINE X 2)  CULTURE, BLOOD (ROUTINE X 2)  MRSA NEXT GEN BY PCR, NASAL  LIPASE, BLOOD  APTT  PROTIME-INR  LACTIC ACID, PLASMA  URINALYSIS, ROUTINE W REFLEX MICROSCOPIC  TROPONIN I (HIGH SENSITIVITY)     EKG Interpreted by me Sinus rhythm rate of 88.  Left axis, right bundle branch block.  Normal ST segments and T waves.   RADIOLOGY Chest x-ray interpreted by me, negative for edema effusion or pneumothorax.  Radiology report reviewed.   PROCEDURES:  .Critical Care  Performed by: Carrie Mew, MD Authorized by: Carrie Mew, MD   Critical care provider statement:    Critical care time (minutes):  35   Critical care time was exclusive of:  Separately billable procedures and treating other patients   Critical care was necessary to treat or prevent imminent or life-threatening deterioration of the following conditions:  Cardiac failure   Critical care was time spent personally by me on the following activities:  Development of treatment plan with patient or surrogate, discussions with consultants, evaluation of patient's response to treatment, examination of patient, obtaining history from patient or surrogate, ordering and performing treatments and interventions, ordering and review of laboratory studies, ordering and review of radiographic studies, pulse  oximetry, re-evaluation of patient's condition and review of old charts   Care discussed with: admitting provider      MEDICATIONS ORDERED IN ED: Medications  aspirin EC tablet 81 mg (has no administration in time range)  nitroGLYCERIN (NITROSTAT) SL tablet 0.4 mg (has no administration in time range)  acetaminophen (TYLENOL) tablet 650 mg (has no administration in time range)  ondansetron (ZOFRAN) tablet 4 mg (has no administration in time range)    Or  ondansetron (ZOFRAN) injection 4 mg (has no administration in time range)  heparin ADULT infusion 100 units/mL (25000 units/287m) (650 Units/hr Intravenous New Bag/Given 08/12/2022 1603)  fentaNYL (SUBLIMAZE) injection 12.5 mcg (has no administration in time range)  mometasone-formoterol (DULERA) 200-5 MCG/ACT inhaler 2 puff (has no administration in time range)  escitalopram (LEXAPRO) tablet 20 mg (has no administration in time range)  gabapentin (NEURONTIN) capsule 100 mg (has no administration in time range)  linagliptin (TRADJENTA) tablet 5 mg (has no administration in time range)  memantine (NAMENDA) tablet 10 mg (has no administration in time range)  senna-docusate (Senokot-S) tablet 1 tablet (has no administration in time range)  lactated ringers infusion (has no administration in time range)  ticagrelor (BRILINTA) tablet 90 mg (has no administration in time range)  sodium chloride flush (NS) 0.9 % injection 3 mL (has no administration in time range)  sodium chloride 0.9 % bolus 1,000 mL (0 mLs Intravenous Stopped 08/09/2022 1520)  aspirin chewable tablet 324 mg (324 mg Oral Given 08/14/2022 1434)  heparin bolus via infusion 3,400 Units (3,400 Units Intravenous Bolus from Bag 08/20/2022 1603)  sodium chloride 0.9 % bolus 500 mL (500 mLs Intravenous New Bag/Given 08/19/2022 1615)     IMPRESSION / [place]DM / [place]/ ED COURSE  I reviewed the triage vital signs and the nursing notes.                              Differential  diagnosis includes, but is not limited to, vagal episode, dehydration, anemia, electrolyte abnormality, non-STEMI  Patient's presentation is most consistent with acute presentation with potential threat to life or bodily function.  Patient presents with thoracic pain after syncope.  Prodromal symptoms consistent with a vagal episode.  Patient seen on evaluation by cardiology Dr. HEllyn Hack reviewed EKG in the ED which is not compatible with STEMI.  Code STEMI canceled.  We will proceed with medical work-up, give IV fluids   Clinical Course as of 08/09/2022 1622  Thu Jul 30, 2022  1411 Troponin I (High Sensitivity)(!!): [phone] give  aspirin, start heparin, plan to admit. Plan d/w daughter at bedside who agrees.  Also they  [PS]    Clinical Course User Index [PS] Carrie Mew, MD       FINAL CLINICAL IMPRESSION(S) / ED DIAGNOSES   Final diagnoses:  NSTEMI (non-ST elevated myocardial infarction) [facility])     Rx / DC Orders   ED Discharge Orders     None        Note:  This document was prepared using Dragon voice recognition software and may include unintentional dictation errors.   Carrie Mew, MD 08/04/2022 1627

## 2022-07-30 NOTE — H&P (Signed)
History and Physical    Patient: Lori Gilmore YDX:[id number] DOB: [birth year] DOA: 08/16/2022 DOS: the patient was seen and examined on 08/02/2022 PCP: Pcp, No  Patient coming from: Home  Chief Complaint:  Chief Complaint  Patient presents with   Loss of Consciousness   HPI: Lori Gilmore is a 88 y.o. female with medical history significant of normocytic anemia, osteoarthritis of the right knee, cerebral thrombosis, anxiety/depression, type 2 diabetes diet-controlled, headaches, hiatal hernia, mild emphysema, hyperlipidemia, hypertension, renal cell carcinoma, seasonal allergies, history of TIA who is brought to the emergency department via EMS after having a syncopal episode and passing out in bed for a few seconds shortly after coming out of the bathroom having a bowel movement.  She does not constipation but may have been straining for a few minutes.  No nausea, vomiting, diaphoresis, dyspnea, but complains of dizziness and weakness.  She was not having chest pain initially, but is having now precordial chest pain radiated to her left sided neck and shoulder.  No PND, orthopnea, but occasionally gets lower extremity edema.  No fever, chills or night sweats. No sore throat, rhinorrhea, dyspnea, wheezing or hemoptysis. No appetite changes, abdominal pain, diarrhea, melena or hematochezia.  No flank pain, dysuria, frequency or hematuria.  No polyuria, polydipsia, polyphagia or blurred vision.  ED course: Initial vital signs were temperature 98.2 F, pulse 88, respiration 13, BP 101/51 mmHg O2 sat 98% on room air.  The patient was given 324 mg of chewable aspirin, normal saline 1000 mL bolus and was ordered a heparin infusion.  However, the patient has a history of low severity heparin sensitivity.  She and her family were concerned about this.  Cardiology and pharmacy notified.  Lab work: CBC showed a white count of 6.3 with 62% neutrophils, hemoglobin 10.6 g/dL and platelets 294.  Lipase  was normal.  Troponin was 1574 ng/L.  CMP showed normal electrolytes.  Glucose 158, BUN 39 and creatinine 1.21 mg/deciliter.  Total protein was 6.3 and albumin 3.3 g/dL.  The rest of the LFTs were normal.  Imaging: Portable 1 view chest radiograph with cardiomegaly, aortic atherosclerosis and moderate size hiatal hernia.  There was no acute cardiopulmonary pathology.   Review of Systems: As mentioned in the history of present illness. All other systems reviewed and are negative. Past Medical History:  Diagnosis Date   Anemia    Arthritis    right knee   Blood clots in brain    Depression    Diabetes mellitus without complication ([place])    Diet controlled   Headache    sinus   History of hiatal hernia    Hypercholesteremia    Hypertension    Renal cancer ([place])    right kidney   Seasonal allergies    TIA (transient ischemic attack) 2000   no deficits   Wears dentures    full upper and lower   Past Surgical History:  Procedure Laterality Date   ABDOMINAL HYSTERECTOMY     APPENDECTOMY     BRAIN SURGERY     for blood clot   BREAST BIOPSY Left 03/21/13   u/s bx-neg   BREAST BIOPSY Left    neg   BREAST CYST ASPIRATION Left    neg   CATARACT EXTRACTION W/PHACO Right 02/13/2015   Procedure: CATARACT EXTRACTION PHACO AND INTRAOCULAR LENS PLACEMENT ([place]);  Surgeon: Leandrew Koyanagi, MD;  Location: [facility];  Service: Ophthalmology;  Laterality: Right;  DIABETIC   CATARACT EXTRACTION W/PHACO Left  03/20/2015   Procedure: CATARACT EXTRACTION PHACO AND INTRAOCULAR LENS PLACEMENT ([facility]);  Surgeon: Leandrew Koyanagi, MD;  Location: [facility];  Service: Ophthalmology;  Laterality: Left;   CHOLECYSTECTOMY     Social History:  reports that she has never smoked. She has never used smokeless tobacco. She reports that she does not drink alcohol and does not use drugs.  Allergies  Allergen Reactions   Erythromycin Other (See Comments)   Oxycodone-Acetaminophen Nausea And  Vomiting   Oxycodone-Acetaminophen Nausea And Vomiting   Tapentadol Other (See Comments)   Tapentadol Hcl Nausea And Vomiting   Amoxicillin Nausea Only   Atorvastatin    Ezetimibe     Other reaction(s): Unknown   Glucosamine-Chondroitin     Other reaction(s): Unknown   Lovastatin Other (See Comments)   Omeprazole Nausea Only   Azithromycin Nausea Only   Erythromycin Base Rash   Heparin Rash and Other (See Comments)    Other reaction(s): Other (See Comments)   Other Rash    Family History  Problem Relation Age of Onset   Stroke Mother    Hypertension Mother    Hypertension Father    Diabetes Daughter    Breast cancer Neg Hx     Prior to Admission medications   Medication Sig Start Date End Date Taking? Authorizing Provider  acetaminophen (TYLENOL) 325 MG tablet Take 650 mg by mouth every 4 (four) hours as needed for headache, fever or mild pain. Up to 24 hours    [provider]  albuterol (PROVENTIL HFA;VENTOLIN HFA) 108 (90 Base) MCG/ACT inhaler Inhale 1-2 puffs into the lungs every 6 (six) hours as needed for wheezing or shortness of breath. Use with spacer 10/20/18   Melynda Ripple, MD  alum & mag hydroxide-simeth (MAALOX/MYLANTA) 200-200-20 MG/5ML suspension Take 30 mLs by mouth once as needed for indigestion or heartburn.    [provider]  aspirin 81 MG tablet Take 81 mg by mouth daily. AM    [provider]  bismuth subsalicylate (PEPTO BISMOL) 262 MG/15ML suspension Take 15 mLs by mouth See admin instructions. Take 1 tablespoonful by mouth every 2 hours as needed for up to 6 doses in 24 hours as needed.    [provider]  calcium carbonate (OSCAL) 1500 (600 Ca) MG TABS tablet Take 1 tablet by mouth 2 (two) times daily. 10/07/20   [provider]  Cholecalciferol 25 MCG (1000 UT) capsule Take 1,000 Units by mouth daily.    [provider]  escitalopram (LEXAPRO) 20 MG tablet Take 1 tablet (20 mg total) by mouth  daily. AM 03/13/19   Rainey Pines, MD  estradiol (ESTRACE) 0.1 MG/GM vaginal cream Estrogen Cream Instruction  Discard applicator  Apply pea sized amount to tip of finger to urethra before bed. Wash hands well after application. Use Monday, Wednesday and Friday 04/08/21   Billey Co, MD  gabapentin (NEURONTIN) 100 MG capsule Take 1 capsule (100 mg total) by mouth 2 (two) times daily. 10/18/20 11/17/20  Sidney Ace, MD  linagliptin (TRADJENTA) 5 MG TABS tablet Take by mouth. 09/02/18   [provider]  lisinopril-hydrochlorothiazide (PRINZIDE,ZESTORETIC) 20-12.5 MG tablet Take 1 tablet by mouth daily.    [provider]  loperamide (IMODIUM) 2 MG capsule Take 2 mg by mouth See admin instructions. Take 1 capsule after each loose stool as needed for up to 4 doses in 12 hours.    [provider]  magnesium hydroxide (MILK OF MAGNESIA) 400 MG/5ML suspension Take 30  mLs by mouth once as needed for mild constipation.    [provider]  memantine (NAMENDA) 10 MG tablet Take 1 tablet (10 mg total) by mouth daily. PM 03/13/19   Rainey Pines, MD  Multiple Vitamin (THERA) TABS Take 1 tablet by mouth daily.  05/11/14   [provider]  Multiple Vitamins-Minerals (VISION-VITE PRESERVE PO) Take 1 tablet by mouth 2 (two) times daily.    [provider]  Phenylephrine-DM-GG (ROBITUSSIN COUGH/COLD CF PO) Take 10 mLs by mouth every 6 (six) hours as needed. Up to 48 hours.    [provider]  vitamin B-12 (CYANOCOBALAMIN) 500 MCG tablet Take 500 mcg by mouth daily.    [provider]    Physical Exam: Vitals:   07/25/2022 1316 08/12/2022 1400  BP: (!) 101/51 (!) 109/46  Pulse: 88 91  Resp: 13 19  Temp: 98.2 F (36.8 C)   TempSrc: Oral   SpO2: 98% 100%  Weight: 56.7 kg   Height: '5\' 8"'$  (1.727 m)    Physical Exam Vitals and nursing note reviewed.  Constitutional:      General: She is awake.     Appearance: She is normal weight.  She is not ill-appearing.     Interventions: Nasal cannula in place.  HENT:     Head: Normocephalic.     Nose: No rhinorrhea.     Mouth/Throat:     Mouth: Mucous membranes are moist.  Eyes:     General: No scleral icterus.    Pupils: Pupils are equal, round, and reactive to light.  Neck:     Vascular: No JVD.  Cardiovascular:     Rate and Rhythm: Normal rate and regular rhythm.     Heart sounds: S1 normal and S2 normal. Murmur heard.     Systolic murmur is present.  Pulmonary:     Effort: Pulmonary effort is normal.     Breath sounds: Normal breath sounds. No wheezing, rhonchi or rales.  Abdominal:     General: Bowel sounds are normal. There is no distension.     Palpations: Abdomen is soft.     Tenderness: There is no abdominal tenderness.  Musculoskeletal:     Cervical back: Neck supple.     Right lower leg: No edema.     Left lower leg: No edema.  Skin:    General: Skin is warm and dry.  Neurological:     General: No focal deficit present.     Mental Status: She is alert and oriented to person, place, and time.  Psychiatric:        Mood and Affect: Mood normal.        Behavior: Behavior normal. Behavior is cooperative.    Data Reviewed:  Results are pending, will review when available.  Assessment and Plan: Principal Problem:   NSTEMI (non-ST elevation myocardial infarction) ([place]) Inpatient/stepdown. Supplemental oxygen as needed. Hold heparin infusion until seen by cardiology. Continue daily 81 mg aspirin. Once BP allows, low-dose metoprolol BID. SL NTG as needed for pain. Low-dose fentanyl 12.5 mg IVP as needed for pain. Trend troponin level. Serial and as needed EKG. Obtain echocardiogram. Cardiology consult has been requested. We will follow their recommendations.  Active Problems:   Syncope Hold antihypertensives today. Monitor/correct electrolyte abnormality. Check carotid Doppler. Check echocardiogram.    Lactic acidosis  She has been  afebrile. No other symptoms of infection. No leukocytosis on CBC. Likely related to syncopal episode. Has received fluids earlier. Follow-up lactic acid level.  Benign essential HTN Hold lisinopril and hydrochlorothiazide. Already had them this morning. Resume when blood pressure allows.    Elevated serum creatinine  superimposed on:   Chronic kidney disease (CKD), stage 3b (moderate) (HCC) Does not meet criteria for AKI. Holding diuretic and ACE inhibitor. Follow-up renal function in AM.    Hypercholesterolemia Check fasting lipids. Check lipoprotein A.    Mild cognitive disorder Continue memantine 10 mg p.o. at bedtime.    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 twice daily.    Mixed anxiety depressive disorder Continue escitalopram 20 mg p.o. daily.    Mild emphysema (HCC) Supplemental oxygen as needed. Continue Symbicort twice daily or formulary equivalent. As needed bronchodilators.     Advance Care Planning:   Code Status: Full Code   Consults: Cardiology on-call Laurelyn Sickle MD).  Family Communication: Two of her daughters were at bedside.  Severity of Illness: The appropriate patient status for this patient is INPATIENT. Inpatient status is judged to be reasonable and necessary in order to provide the required intensity of service to ensure the patient's safety. The patient's presenting symptoms, physical exam findings, and initial radiographic and laboratory data in the context of their chronic comorbidities is felt to place them at high risk for further clinical deterioration. Furthermore, it is not anticipated that the patient will be medically stable for discharge from the hospital within 2 midnights of admission.   * I certify that at the point of admission it is my clinical judgment that the patient will require inpatient hospital care spanning beyond 2 midnights from the point of admission due to high intensity  of service, high risk for further deterioration and high frequency of surveillance required.*  Author: Reubin Milan, MD 08/05/2022 2:59 PM  For on call review [email or web address].   This document was prepared using Dragon voice recognition software and may contain some unintended transcription errors.

## 2022-07-30 NOTE — Consult Note (Signed)
Lori Gilmore is a 88 y.o. female  [id number]  Primary Cardiologist: Neoma Laming, MD Reason for Consultation: NSTEMI  HPI: Patient is a 88 year old female with PMH significant for anemia, cerebral thrombosis, depression, type II diabetes, hiatal hernia, hyperlipidemia, HTN, renal cell carcinoma, TIA, who was brought to the ED via EMS after having a syncopal episode. Patient reports using the bathroom, when returning to her bedroom she passed out for a few seconds on to her bed. After the syncopal episode, patient began to feel weak. Now complaining of chest pain under left breast. Patient denies hitting the floor or hard surface during syncopal episode.   Review of Systems: chest pain under left breast, syncopal episode, weakness, denies shortness of breath   Past Medical History:  Diagnosis Date   Anemia    Arthritis    right knee   Blood clots in brain    Depression    Diabetes mellitus without complication (HCC)    Diet controlled   Headache    sinus   History of hiatal hernia    Hypercholesteremia    Hypertension    Renal cancer (HCC)    right kidney   Seasonal allergies    TIA (transient ischemic attack) 2000   no deficits   Wears dentures    full upper and lower    (Not in a hospital admission)     [START ON 08-02-2022] aspirin EC  81 mg Oral Daily   heparin  3,400 Units Intravenous Once    Infusions:  heparin      Allergies  Allergen Reactions   Erythromycin Other (See Comments)   Oxycodone-Acetaminophen Nausea And Vomiting   Oxycodone-Acetaminophen Nausea And Vomiting   Tapentadol Other (See Comments)   Tapentadol Hcl Nausea And Vomiting   Amoxicillin Nausea Only   Atorvastatin    Ezetimibe     Other reaction(s): Unknown   Glucosamine-Chondroitin     Other reaction(s): Unknown   Lovastatin Other (See Comments)   Omeprazole Nausea Only   Azithromycin Nausea Only   Erythromycin Base Rash   Heparin Rash and Other (See Comments)    Other  reaction(s): Other (See Comments)   Other Rash    Social History   Socioeconomic History   Marital status: Widowed    Spouse name: Not on file   Number of children: 4   Years of education: Not on file   Highest education level: 9th grade  Occupational History    Comment: retired  Tobacco Use   Smoking status: Never   Smokeless tobacco: Never  Vaping Use   Vaping Use: Never used  Substance and Sexual Activity   Alcohol use: No   Drug use: No   Sexual activity: Never  Other Topics Concern   Not on file  Social History Narrative   Not on file   Social Determinants of Health   Financial Resource Strain: Low Risk  (08/23/2017)   Overall Financial Resource Strain (CARDIA)    Difficulty of Paying Living Expenses: Not very hard  Food Insecurity: No Food Insecurity (08/23/2017)   Hunger Vital Sign    Worried About Running Out of Food in the Last Year: Never true    Ran Out of Food in the Last Year: Never true  Transportation Needs: No Transportation Needs (08/23/2017)   PRAPARE - Hydrologist (Medical): No    Lack of Transportation (Non-Medical): No  Physical Activity: Inactive (08/23/2017)   Exercise Vital Sign  Days of Exercise per Week: 0 days    Minutes of Exercise per Session: 0 min  Stress: Stress Concern Present (08/23/2017)   [place]    Feeling of Stress : To some extent  Social Connections: Somewhat Isolated (08/23/2017)   Social Connection and Isolation Panel [NHANES]    Frequency of Communication with Friends and Family: More than three times a week    Frequency of Social Gatherings with Friends and Family: Once a week    Attends Religious Services: More than 4 times per year    Active Member of [employer] or Organizations: No    Attends Archivist Meetings: Never    Marital Status: Widowed  Intimate Partner Violence: Not At Risk (08/23/2017)   Humiliation,  Afraid, Rape, and Kick questionnaire    Fear of Current or Ex-Partner: No    Emotionally Abused: No    Physically Abused: No    Sexually Abused: No    Family History  Problem Relation Age of Onset   Stroke Mother    Hypertension Mother    Hypertension Father    Diabetes Daughter    Breast cancer Neg Hx     PHYSICAL EXAM: Vitals:   07/25/2022 1316 07/22/2022 1400  BP: (!) 101/51 (!) 109/46  Pulse: 88 91  Resp: 13 19  Temp: 98.2 F (36.8 C)   SpO2: 98% 100%    No intake or output data in the 24 hours ending 07/29/2022 1512  General:  Well appearing. No respiratory difficulty HEENT: normal Neck: supple. no JVD. Carotids 2+ bilat; no bruits. No lymphadenopathy or thryomegaly appreciated. Cor: PMI nondisplaced. Regular rate & rhythm. No rubs, gallops or murmurs. Lungs: clear Abdomen: soft, nontender, nondistended. No hepatosplenomegaly. No bruits or masses. Good bowel sounds. Extremities: no cyanosis, clubbing, rash, edema Neuro: alert & oriented x 3, cranial nerves grossly intact. moves all 4 extremities w/o difficulty. Affect pleasant.  ECG: sinus rhythm, HR 88 bpm, LVH, possible LAE  Results for orders placed or performed during the hospital encounter of 08/04/2022 (from the past 24 hour(s))  Troponin I (High Sensitivity)     Status: Abnormal   Collection Time: 08/17/2022  1:29 PM  Result Value Ref Range   Troponin I (High Sensitivity) 1,574 (HH) <18 ng/L  CBC with Differential     Status: Abnormal   Collection Time: 08/16/2022  1:30 PM  Result Value Ref Range   WBC 6.3 4.0 - 10.5 K/uL   RBC 3.51 (L) 3.87 - 5.11 MIL/uL   Hemoglobin 10.6 (L) 12.0 - 15.0 g/dL   HCT 33.3 (L) 36.0 - 46.0 %   MCV 94.9 80.0 - 100.0 fL   MCH 30.2 26.0 - 34.0 pg   MCHC 31.8 30.0 - 36.0 g/dL   RDW 13.3 11.5 - 15.5 %   Platelets 294 150 - 400 K/uL   nRBC 0.0 0.0 - 0.2 %   Neutrophils Relative % 62 %   Neutro Abs 3.9 1.7 - 7.7 K/uL   Lymphocytes Relative 25 %   Lymphs Abs 1.5 0.7 - 4.0 K/uL    Monocytes Relative 12 %   Monocytes Absolute 0.7 0.1 - 1.0 K/uL   Eosinophils Relative 1 %   Eosinophils Absolute 0.1 0.0 - 0.5 K/uL   Basophils Relative 0 %   Basophils Absolute 0.0 0.0 - 0.1 K/uL   Immature Granulocytes 0 %   Abs Immature Granulocytes 0.02 0.00 - 0.07 K/uL  Lactic acid, plasma  Status: Abnormal   Collection Time: 08/04/2022  1:30 PM  Result Value Ref Range   Lactic Acid, Venous 2.8 (HH) 0.5 - 1.9 mmol/L  Comprehensive metabolic panel     Status: Abnormal   Collection Time: 08/03/2022  1:52 PM  Result Value Ref Range   Sodium 140 135 - 145 mmol/L   Potassium 4.7 3.5 - 5.1 mmol/L   Chloride 108 98 - 111 mmol/L   CO2 24 22 - 32 mmol/L   Glucose, Bld 158 (H) 70 - 99 mg/dL   BUN 39 (H) 8 - 23 mg/dL   Creatinine, Ser 1.21 (H) 0.44 - 1.00 mg/dL   Calcium 9.1 8.9 - 10.3 mg/dL   Total Protein 6.3 (L) 6.5 - 8.1 g/dL   Albumin 3.3 (L) 3.5 - 5.0 g/dL   AST 30 15 - 41 U/L   ALT 8 0 - 44 U/L   Alkaline Phosphatase 45 38 - 126 U/L   Total Bilirubin 0.6 0.3 - 1.2 mg/dL   GFR, Estimated 44 (L) >60 mL/min   Anion gap 8 5 - 15  Lipase, blood     Status: None   Collection Time: 08/14/2022  1:52 PM  Result Value Ref Range   Lipase 38 11 - 51 U/L   DG Chest Portable 1 View  Result Date: 07/23/2022 CLINICAL DATA:  Provided history: Chest pain. EXAM: PORTABLE CHEST 1 VIEW COMPARISON:  Prior chest radiographs 10/16/2020 and earlier. FINDINGS: Cardiac monitoring pads overlie the chest. Cardiomegaly. Aortic atherosclerosis. Moderate-sized hiatal hernia. No appreciable airspace consolidation or pulmonary edema. No evidence of pleural effusion or pneumothorax. Degenerative changes of the spine and left glenohumeral joint. IMPRESSION: No evidence of acute cardiopulmonary abnormality. Cardiomegaly. Aortic Atherosclerosis (ICD10-I70.0). Moderate-sized hiatal hernia. Electronically Signed   By: Kellie Simmering D.O.   On: 08/02/2022 14:22     ASSESSMENT AND PLAN: Patient complaining of chest pain  under left breast after syncopal episode. Troponin level 1,574. Echo to bee completed. Discussed risks and benefits with patient for cardiac cath. Patient is agreeable. Patient scheduled for cardiac cath first available. Per patient daughters, patient allergic to heparin. Will start [place]. Will continue to follow.   Engineer, drilling FNP-C

## 2022-07-30 NOTE — Progress Notes (Addendum)
1910 patient alert able to make needs known nephew at bedside chest pain noted left side of body hurts when palpating  2046 patient bladder scanned no urine documented from ER and patient has not urinated since being on the floor 112 ml in bladder, patient reports she has no urge to use the bathroom at this time will monitor. Skin monitored no rashes noted.   08/17/22 no urine out put patient encouraged to go if needed still no urge  0150 bladder scanned 187 ml noted patient has no urge to urinate at this time. SP02 below 90s 02 placed on patient  0300 got patient to [facility] then back to bed HR went to systole them to bradycardia after sternal rub patient HR 80 then to 100s patient started to respond 0621 patient HR went asystole again on arrival to room sternal rubbed patient HR increase to 91 patient woke up followed commands. 0638 Patient had a pause easy to arouse following commands

## 2022-07-30 NOTE — ED Triage Notes (Signed)
"  Came out of bathroom and passed out on bed for a few seconds,  now I am just feeling weak" per patient.   Denies any injury. Denies chest pain or shortness of breath.

## 2022-07-31 ENCOUNTER — Inpatient Hospital Stay
Admit: 2022-07-31 | Discharge: 2022-07-31 | Disposition: A | Discharge status: Home / Self Care | Payer: Medicare Other | Attending: Internal Medicine | Admitting: Internal Medicine

## 2022-07-31 ENCOUNTER — Encounter: Admission: EM | Disposition: E | Payer: Self-pay | Source: Home / Self Care | Attending: Internal Medicine

## 2022-07-31 DIAGNOSIS — I5022 Chronic systolic (congestive) heart failure: Secondary | ICD-10-CM | POA: Insufficient documentation

## 2022-07-31 DIAGNOSIS — I2510 Atherosclerotic heart disease of native coronary artery without angina pectoris: Secondary | ICD-10-CM | POA: Insufficient documentation

## 2022-07-31 DIAGNOSIS — J9601 Acute respiratory failure with hypoxia: Secondary | ICD-10-CM | POA: Insufficient documentation

## 2022-07-31 DIAGNOSIS — I459 Conduction disorder, unspecified: Secondary | ICD-10-CM | POA: Insufficient documentation

## 2022-07-31 DIAGNOSIS — I4430 Unspecified atrioventricular block: Secondary | ICD-10-CM

## 2022-07-31 DIAGNOSIS — I959 Hypotension, unspecified: Secondary | ICD-10-CM | POA: Insufficient documentation

## 2022-07-31 HISTORY — PX: PACEMAKER LEADLESS INSERTION: EP1219

## 2022-07-31 HISTORY — PX: LEFT HEART CATH AND CORONARY ANGIOGRAPHY: CATH118249

## 2022-07-31 HISTORY — PX: TEMPORARY PACEMAKER: CATH118268

## 2022-07-31 LAB — BLOOD GAS, ARTERIAL
Acid-base deficit: 5.4 mmol/L — ABNORMAL HIGH (ref 0.0–2.0)
Bicarbonate: 19.2 mmol/L — ABNORMAL LOW (ref 20.0–28.0)
FIO2: 100 %
O2 Content: 15 L/min
O2 Saturation: 97.3 %
Patient temperature: 37
pCO2 arterial: 34 mmHg (ref 32–48)
pH, Arterial: 7.36 (ref 7.35–7.45)
pO2, Arterial: 90 mmHg (ref 83–108)

## 2022-07-31 LAB — CBC
HCT: 29.6 % — ABNORMAL LOW (ref 36.0–46.0)
Hemoglobin: 9.7 g/dL — ABNORMAL LOW (ref 12.0–15.0)
MCH: 30.6 pg (ref 26.0–34.0)
MCHC: 32.8 g/dL (ref 30.0–36.0)
MCV: 93.4 fL (ref 80.0–100.0)
Platelets: 252 10*3/uL (ref 150–400)
RBC: 3.17 MIL/uL — ABNORMAL LOW (ref 3.87–5.11)
RDW: 13.3 % (ref 11.5–15.5)
WBC: 12.9 10*3/uL — ABNORMAL HIGH (ref 4.0–10.5)
nRBC: 0 % (ref 0.0–0.2)

## 2022-07-31 LAB — LIPID PANEL
Cholesterol: 143 mg/dL (ref 0–200)
HDL: 50 mg/dL (ref >40)
LDL Cholesterol: 83 mg/dL (ref 0–99)
Total CHOL/HDL Ratio: 2.9 RATIO
Triglycerides: 48 mg/dL (ref <150)
VLDL: 10 mg/dL (ref 0–40)

## 2022-07-31 LAB — HEMOGLOBIN A1C
Hgb A1c MFr Bld: 5.8 % — ABNORMAL HIGH (ref 4.8–5.6)
Mean Plasma Glucose: 119.76 mg/dL

## 2022-07-31 LAB — COMPREHENSIVE METABOLIC PANEL
ALT: 18 U/L (ref 0–44)
AST: 56 U/L — ABNORMAL HIGH (ref 15–41)
Albumin: 2.9 g/dL — ABNORMAL LOW (ref 3.5–5.0)
Alkaline Phosphatase: 46 U/L (ref 38–126)
Anion gap: 5 (ref 5–15)
BUN: 41 mg/dL — ABNORMAL HIGH (ref 8–23)
CO2: 21 mmol/L — ABNORMAL LOW (ref 22–32)
Calcium: 8.3 mg/dL — ABNORMAL LOW (ref 8.9–10.3)
Chloride: 113 mmol/L — ABNORMAL HIGH (ref 98–111)
Creatinine, Ser: 1.31 mg/dL — ABNORMAL HIGH (ref 0.44–1.00)
GFR, Estimated: 40 mL/min — ABNORMAL LOW (ref >60)
Glucose, Bld: 239 mg/dL — ABNORMAL HIGH (ref 70–99)
Potassium: 4.4 mmol/L (ref 3.5–5.1)
Sodium: 139 mmol/L (ref 135–145)
Total Bilirubin: 1 mg/dL (ref 0.3–1.2)
Total Protein: 5.5 g/dL — ABNORMAL LOW (ref 6.5–8.1)

## 2022-07-31 LAB — GLUCOSE, CAPILLARY
Glucose-Capillary: 114 mg/dL — ABNORMAL HIGH (ref 70–99)
Glucose-Capillary: 232 mg/dL — ABNORMAL HIGH (ref 70–99)
Glucose-Capillary: 211 mg/dL — ABNORMAL HIGH (ref 70–99)
Glucose-Capillary: 175 mg/dL — ABNORMAL HIGH (ref 70–99)

## 2022-07-31 LAB — MAGNESIUM: Magnesium: 1.7 mg/dL (ref 1.7–2.4)

## 2022-07-31 LAB — TROPONIN I (HIGH SENSITIVITY): Troponin I (High Sensitivity): 3856 ng/L (ref <18)

## 2022-07-31 SURGERY — LEFT HEART CATH AND CORONARY ANGIOGRAPHY
Anesthesia: Moderate Sedation

## 2022-07-31 MED ORDER — NOREPINEPHRINE BITARTRATE 1 MG/ML IV SOLN
INTRAVENOUS | Status: AC
  Filled 2022-07-31: qty 4

## 2022-07-31 MED ORDER — ATROPINE SULFATE 1 MG/10ML IJ SOSY
PREFILLED_SYRINGE | INTRAMUSCULAR | Status: AC
  Filled 2022-07-31: qty 10

## 2022-07-31 MED ORDER — NOREPINEPHRINE BITARTRATE 1 MG/ML IV SOLN
INTRAVENOUS | Status: AC | PRN
  Administered 2022-07-31: 5 ug/min via INTRAVENOUS

## 2022-07-31 MED ORDER — HEPARIN (PORCINE) IN NACL 1000-0.9 UT/500ML-% IV SOLN
INTRAVENOUS | Status: DC | PRN
  Administered 2022-07-31 (×2): 500 mL

## 2022-07-31 MED ORDER — LIDOCAINE HCL (PF) 1 % IJ SOLN
INTRAMUSCULAR | Status: DC | PRN
  Administered 2022-07-31: 2 mL
  Administered 2022-07-31: 15 mL
  Administered 2022-07-31: 2 mL

## 2022-07-31 MED ORDER — SODIUM CHLORIDE 0.9 % IV SOLN
INTRAVENOUS | Status: AC | PRN
  Administered 2022-07-31: 300 mL via INTRAVENOUS

## 2022-07-31 MED ORDER — LIDOCAINE HCL 1 % IJ SOLN
INTRAMUSCULAR | Status: AC
  Filled 2022-07-31: qty 40

## 2022-07-31 MED ORDER — ASPIRIN 81 MG PO CHEW: 81.0000 mg | CHEWABLE_TABLET | ORAL | Status: AC

## 2022-07-31 MED ORDER — HEPARIN (PORCINE) IN NACL 1000-0.9 UT/500ML-% IV SOLN
INTRAVENOUS | Status: AC
  Filled 2022-07-31: qty 1500

## 2022-07-31 MED ORDER — DIPHENHYDRAMINE HCL 50 MG/ML IJ SOLN
INTRAMUSCULAR | Status: AC
  Filled 2022-07-31: qty 1

## 2022-07-31 MED ORDER — SODIUM CHLORIDE 0.9 % WEIGHT BASED INFUSION: 1.0000 mL/kg/h | INTRAVENOUS | Status: DC

## 2022-07-31 MED ORDER — HEPARIN SODIUM (PORCINE) 1000 UNIT/ML IJ SOLN
INTRAMUSCULAR | Status: AC
  Filled 2022-07-31: qty 10

## 2022-07-31 MED ORDER — SODIUM CHLORIDE 0.9 % IV SOLN: INTRAVENOUS | Status: DC

## 2022-07-31 MED ORDER — SODIUM CHLORIDE 0.9 % IV SOLN: 250.0000 mL | INTRAVENOUS | Status: DC | PRN

## 2022-07-31 MED ORDER — IOHEXOL 300 MG/ML  SOLN
INTRAMUSCULAR | Status: DC | PRN
  Administered 2022-07-31: 87 mL

## 2022-07-31 MED ORDER — CHLORHEXIDINE GLUCONATE CLOTH 2 % EX PADS: 6.0000 | MEDICATED_PAD | Freq: Every day | CUTANEOUS | Status: DC

## 2022-07-31 MED ORDER — VERAPAMIL HCL 2.5 MG/ML IV SOLN
INTRAVENOUS | Status: DC | PRN
  Administered 2022-07-31: 2.5 mg via INTRA_ARTERIAL

## 2022-07-31 MED ORDER — ASPIRIN 81 MG PO CHEW
CHEWABLE_TABLET | ORAL | Status: AC
  Administered 2022-07-31: 81 mg via ORAL
  Filled 2022-07-31: qty 1

## 2022-07-31 MED ORDER — HEPARIN SODIUM (PORCINE) 1000 UNIT/ML IJ SOLN
INTRAMUSCULAR | Status: DC | PRN
  Administered 2022-07-31: 2000 [IU] via INTRAVENOUS
  Administered 2022-07-31: 2500 [IU] via INTRAVENOUS

## 2022-07-31 MED ORDER — VERAPAMIL HCL 2.5 MG/ML IV SOLN
INTRAVENOUS | Status: AC
  Filled 2022-07-31: qty 2

## 2022-07-31 MED ORDER — SODIUM CHLORIDE 0.9 % WEIGHT BASED INFUSION
3.0000 mL/kg/h | INTRAVENOUS | Status: DC
  Administered 2022-07-31: 3 mL/kg/h via INTRAVENOUS

## 2022-07-31 MED ORDER — SODIUM CHLORIDE 0.9% FLUSH: 3.0000 mL | INTRAVENOUS | Status: DC | PRN

## 2022-07-31 SURGICAL SUPPLY — 31 items
CABLE ADAPT PACING TEMP 12FT (ADAPTER) IMPLANT
CATH 5FR JL3.5 JR4 ANG PIG MP (CATHETERS) IMPLANT
COVER EZ STRL 42X30 (DRAPES) IMPLANT
DEVICE RAD COMP TR BAND LRG (VASCULAR PRODUCTS) IMPLANT
DILATOR VESSEL 38 20CM 12FR (INTRODUCER) IMPLANT
DILATOR VESSEL 38 20CM 14FR (INTRODUCER) IMPLANT
DILATOR VESSEL 38 20CM 18FR (INTRODUCER) IMPLANT
DILATOR VESSEL 38 20CM 8FR (INTRODUCER) IMPLANT
DRAPE BRACHIAL (DRAPES) IMPLANT
GLIDESHEATH SLEND SS 6F .021 (SHEATH) IMPLANT
GUIDEWIRE INQWIRE 1.5J.035X260 (WIRE) IMPLANT
INQWIRE 1.5J .035X260CM (WIRE) ×1
MICRA AV TRANSCATH PACING SYS (Pacemaker) ×1 IMPLANT
MICRA INTRODUCER SHEATH (SHEATH) ×1
NDL PERC 18GX7CM (NEEDLE) IMPLANT
NEEDLE PERC 18GX7CM (NEEDLE) ×1 IMPLANT
PACK CARDIAC CATH (CUSTOM PROCEDURE TRAY) ×1 IMPLANT
PAD ELECT DEFIB RADIOL ZOLL (MISCELLANEOUS) IMPLANT
PROTECTION STATION PRESSURIZED (MISCELLANEOUS) ×1
SET ATX SIMPLICITY (MISCELLANEOUS) IMPLANT
SHEATH AVANTI 5FR X 11CM (SHEATH) IMPLANT
SHEATH AVANTI 6FR X 11CM (SHEATH) IMPLANT
SHEATH AVANTI 7FRX11 (SHEATH) IMPLANT
SHEATH INTRODUCER MICRA (SHEATH) IMPLANT
STATION PROTECTION PRESSURIZED (MISCELLANEOUS) IMPLANT
SUT SILK 0 FSL (SUTURE) IMPLANT
SYSTEM PACING TRNSCTH AV MICRA (Pacemaker) IMPLANT
WIRE AMPLATZ SS-J .035X180CM (WIRE) IMPLANT
WIRE GUIDERIGHT .035X150 (WIRE) IMPLANT
WIRE HITORQ VERSACORE ST 145CM (WIRE) IMPLANT
WIRE PACING TEMP ST TIP 5 (CATHETERS) IMPLANT

## 2022-08-01 LAB — LIPOPROTEIN A (LPA): Lipoprotein (a): 10.1 nmol/L (ref <75.0)

## 2022-08-03 ENCOUNTER — Encounter: Payer: Self-pay | Admitting: Cardiology

## 2022-08-04 LAB — CULTURE, BLOOD (ROUTINE X 2)
Culture: NO GROWTH
Culture: NO GROWTH
Special Requests: ADEQUATE
Special Requests: ADEQUATE

## 2022-08-21 NOTE — Consult Note (Signed)
[place] NOTE       Patient ID: Lori Gilmore MRN: [id number] DOB/AGE: 86-05-1934 86 y.o.  Admit date: 08/01/2022 Referring Physician Dr. Neoma Laming Primary Physician Dr. Raechel Ache Primary Cardiologist none Reason for Consultation high grade AV block   HPI: Lori Gilmore is an 88yoF with a PMH of DM2, emphysema, HTN, HLD, renal cell carcinoma, history of TIA, cerebral thrombosis, cognitive impairment who presented to [facility] ED 07/28/2022 after a syncopal episode that occurred in bed after the patient came out of the bathroom after having a bowel movement.  Her initial primary complaint was dizziness and weakness immediately following the syncopal event, but apparently complained of left-sided chest pain later on.  The unassigned cardiologist Humphrey Rolls) was consulted with concern for for NSTEMI with elevated troponins (peak 4998) and left-sided chest discomfort.  She was scheduled for LHC the morning of 11/10.  During early morning hours of 11/10 nursing staff and cross cover noted "asystolic episodes" for which the patient was responsive to sternal rub.  These correlate with episodes of high-grade AV block for which interventional cardiology was consulted.  The patient presents with her 2 daughters (shared POA, 1 present by phone) who contribute to the history.  The patient is a resident at [facility] and notes the history above feeling dizzy, weak and passing out for several seconds before regaining consciousness, she currently denies any chest discomfort, but she reportedly had left-sided chest pain following the syncopal episode yesterday.  During the early morning hours of 11/10 after the patient got up to use the bedside commode, the patient became bradycardic and review of telemetry strips initially shows sinus rhythm then converting to a high-grade AV block around 0316 this morning with periods of a junctional rhythm after a sternal rub, then converting back to sinus  rhythm.  She had another episode of high-grade AV block greater than 10 seconds around 06 21, again responsive to sternal rub.  Repeat twelve-lead EKG shows sinus rhythm with deep inferior and anterior T wave inversions.   At my time of evaluation the patient is sitting upright in her hospital bed in special recoveries in anticipation of LHC that was planned for this morning prior to discovery of this high-grade AV block..  She is conversational and chest pain-free, she is borderline hypotensive with recent blood pressure 93/50, heart rate 70s in sinus rhythm on telemetry, she was put on a nonrebreather mask at 15 L/min overnight and this was apparently continued through the morning.  She currently denies any dizziness, nausea, vomiting or pain.   Review of systems complete and found to be negative unless listed above     Past Medical History:  Diagnosis Date   Anemia    Arthritis    right knee   Blood clots in brain    Depression    Diabetes mellitus without complication ([place])    Diet controlled   Headache    sinus   History of hiatal hernia    Hypercholesteremia    Hypertension    Mild emphysema ([place]) 10/20/2018   Formatting of this note might be different from the original. On 10/20/2018 chest [facility] at [facility]; likely secondary to passive smoke exposure from husband   Renal cancer [facility])    right kidney   Seasonal allergies    TIA (transient ischemic attack) 2000   no deficits   Wears dentures    full upper and lower    Past Surgical History:  Procedure Laterality Date   ABDOMINAL HYSTERECTOMY     APPENDECTOMY     BRAIN SURGERY     for blood clot   BREAST BIOPSY Left 03/21/13   u/s bx-neg   BREAST BIOPSY Left    neg   BREAST CYST ASPIRATION Left    neg   CATARACT EXTRACTION W/PHACO Right 02/13/2015   Procedure: CATARACT EXTRACTION PHACO AND INTRAOCULAR LENS PLACEMENT ([place]);  Surgeon: Leandrew Koyanagi, MD;  Location: [facility];  Service:  Ophthalmology;  Laterality: Right;  DIABETIC   CATARACT EXTRACTION W/PHACO Left 03/20/2015   Procedure: CATARACT EXTRACTION PHACO AND INTRAOCULAR LENS PLACEMENT ([facility]);  Surgeon: Leandrew Koyanagi, MD;  Location: [place];  Service: Ophthalmology;  Laterality: Left;   CHOLECYSTECTOMY      Medications Prior to Admission  Medication Sig Dispense Refill Last Dose   acetaminophen (TYLENOL) 325 MG tablet Take 650 mg by mouth every 4 (four) hours as needed for headache, fever or mild pain.   Unknown at PRN   albuterol (PROVENTIL HFA;VENTOLIN HFA) 108 (90 Base) MCG/ACT inhaler Inhale 1-2 puffs into the lungs every 6 (six) hours as needed for wheezing or shortness of breath. Use with spacer 1 Inhaler 0 Unknown at PRN   aspirin 81 MG tablet Take 81 mg by mouth in the morning.   07/23/2022 at 0800   budesonide-formoterol (SYMBICORT) 160-4.5 MCG/ACT inhaler Inhale 2 puffs into the lungs 2 (two) times daily.   08/20/2022 at 0800   calcium carbonate (OSCAL) 1500 (600 Ca) MG TABS tablet Take 1 tablet by mouth 2 (two) times daily.   08/20/2022 at 0800   Cholecalciferol 25 MCG (1000 UT) capsule Take 1,000 Units by mouth daily.   08/11/2022 at 0800   escitalopram (LEXAPRO) 20 MG tablet Take 1 tablet (20 mg total) by mouth daily. AM 90 tablet 2 07/29/2022 at 0800   gabapentin (NEURONTIN) 100 MG capsule Take 1 capsule (100 mg total) by mouth 2 (two) times daily. 60 capsule 0 08/12/2022 at 0800   linagliptin (TRADJENTA) 5 MG TABS tablet Take 5 mg by mouth daily.   07/27/2022 at 0800   lisinopril-hydrochlorothiazide (PRINZIDE,ZESTORETIC) 20-12.5 MG tablet Take 2 tablets by mouth daily.   08/10/2022 at 0800   memantine (NAMENDA) 10 MG tablet Take 1 tablet (10 mg total) by mouth daily. PM (Patient taking differently: Take 10 mg by mouth at bedtime. PM) 90 tablet 3 07/29/2022 at 2000   Multiple Vitamin (THERA) TABS Take 1 tablet by mouth in the morning.   08/16/2022 at 0800   Multiple Vitamins-Minerals (VISION-VITE  PRESERVE PO) Take 1 tablet by mouth 2 (two) times daily.   08/18/2022 at 0730   vitamin B-12 (CYANOCOBALAMIN) 500 MCG tablet Take 500 mcg by mouth daily.   07/23/2022 at 0800   Social History   Socioeconomic History   Marital status: Widowed    Spouse name: Not on file   Number of children: 4   Years of education: Not on file   Highest education level: 9th grade  Occupational History    Comment: retired  Tobacco Use   Smoking status: Never   Smokeless tobacco: Never  Vaping Use   Vaping Use: Never used  Substance and Sexual Activity   Alcohol use: No   Drug use: No   Sexual activity: Never  Other Topics Concern   Not on file  Social History Narrative   Not on file   Social Determinants of Health   Financial Resource Strain: Low Risk  (08/23/2017)  Overall Financial Resource Strain (CARDIA)    Difficulty of Paying Living Expenses: Not very hard  Food Insecurity: No Food Insecurity (08/23/2017)   Hunger Vital Sign    Worried About Running Out of Food in the Last Year: Never true    Ran Out of Food in the Last Year: Never true  Transportation Needs: No Transportation Needs (08/23/2017)   PRAPARE - Hydrologist (Medical): No    Lack of Transportation (Non-Medical): No  Physical Activity: Inactive (08/23/2017)   Exercise Vital Sign    Days of Exercise per Week: 0 days    Minutes of Exercise per Session: 0 min  Stress: Stress Concern Present (08/23/2017)   [place]    Feeling of Stress : To some extent  Social Connections: Somewhat Isolated (08/23/2017)   Social Connection and Isolation Panel [NHANES]    Frequency of Communication with Friends and Family: More than three times a week    Frequency of Social Gatherings with Friends and Family: Once a week    Attends Religious Services: More than 4 times per year    Active Member of [employer] or Organizations: No    Attends Theatre manager Meetings: Never    Marital Status: Widowed  Intimate Partner Violence: Not At Risk (08/23/2017)   Humiliation, Afraid, Rape, and Kick questionnaire    Fear of Current or Ex-Partner: No    Emotionally Abused: No    Physically Abused: No    Sexually Abused: No    Family History  Problem Relation Age of Onset   Stroke Mother    Hypertension Mother    Hypertension Father    Diabetes Daughter    Breast cancer Neg Hx      Vitals:   Aug 08, 2022 0641 2022/08/08 0649 August 08, 2022 0700 08/08/2022 0800  BP: (!) 88/56 (!) 87/52 (!) 92/52 (!) 92/55  Pulse: 69 73 72 72  Resp: 20 (!) 21 (!) 22 (!) 23  Temp:      TempSrc:      SpO2: 99% 98% 100% 98%  Weight:      Height:        PHYSICAL EXAM General: Elderly and thin Caucasian female, well nourished, in no acute distress.  Laying flat in hospital bed on nonrebreather, 1 daughter present in the room and the other daughter present by phone. HEENT:  Normocephalic and atraumatic. Neck:  No JVD.  Lungs: Normal respiratory effort on nonrebreather. Clear bilaterally to auscultation. No wheezes, crackles, rhonchi.  Heart: HRRR . Normal S1 and S2 without gallops or murmurs.  Abdomen: Non-distended appearing.  Msk: Normal strength and tone for age. Extremities: Warm and well perfused. No clubbing, cyanosis.  No obvious peripheral edema.  Neuro: Awake and alert.  Conversational. Psych:  Answers questions appropriately.   Labs: Basic Metabolic Panel: Recent Labs    07/28/2022 1352 08/08/22 0439  NA 140 139  K 4.7 4.4  CL 108 113*  CO2 24 21*  GLUCOSE 158* 239*  BUN 39* 41*  CREATININE 1.21* 1.31*  CALCIUM 9.1 8.3*   Liver Function Tests: Recent Labs    08/14/2022 1352 08/08/22 0439  AST 30 56*  ALT 8 18  ALKPHOS 45 46  BILITOT 0.6 1.0  PROT 6.3* 5.5*  ALBUMIN 3.3* 2.9*   Recent Labs    08/04/2022 1352  LIPASE 38   CBC: Recent Labs    08/01/2022 1330 Aug 08, 2022 0439  WBC 6.3 12.9*  NEUTROABS  3.9  --   HGB 10.6* 9.7*  HCT  33.3* 29.6*  MCV 94.9 93.4  PLT 294 252   Cardiac Enzymes: Recent Labs    07/23/2022 1329 08/14/2022 1617 08-12-2022 0439  TROPONINIHS 1,574* 4,990* 3,856*   BNP: No results for input(s): "BNP" in the last 72 hours. D-Dimer: No results for input(s): "DDIMER" in the last 72 hours. Hemoglobin A1C: No results for input(s): "HGBA1C" in the last 72 hours. Fasting Lipid Panel: Recent Labs    08-12-22 0439  CHOL 143  HDL 50  LDLCALC 83  TRIG 48  CHOLHDL 2.9   Thyroid Function Tests: No results for input(s): "TSH", "T4TOTAL", "T3FREE", "THYROIDAB" in the last 72 hours.  Invalid input(s): "FREET3" Anemia Panel: No results for input(s): "VITAMINB12", "FOLATE", "FERRITIN", "TIBC", "IRON", "RETICCTPCT" in the last 72 hours.   Radiology: DG Chest Portable 1 View  Result Date: 07/24/2022 CLINICAL DATA:  Provided history: Chest pain. EXAM: PORTABLE CHEST 1 VIEW COMPARISON:  Prior chest radiographs 10/16/2020 and earlier. FINDINGS: Cardiac monitoring pads overlie the chest. Cardiomegaly. Aortic atherosclerosis. Moderate-sized hiatal hernia. No appreciable airspace consolidation or pulmonary edema. No evidence of pleural effusion or pneumothorax. Degenerative changes of the spine and left glenohumeral joint. IMPRESSION: No evidence of acute cardiopulmonary abnormality. Cardiomegaly. Aortic Atherosclerosis (ICD10-I70.0). Moderate-sized hiatal hernia. Electronically Signed   By: Kellie Simmering D.O.   On: 07/26/2022 14:22    ECHO no prior  TELEMETRY reviewed by me (LT) 08/12/22 : Sinus rhythm with greater than 10-second episodes of high-grade AV block occurring at 0316 and 0621 on 11/10  Data reviewed by me (LT) Aug 12, 2022: ED note admission H&P cardiology consult note CBC BMP troponins vitals telemetry ordered mag  Principal Problem:   NSTEMI (non-ST elevation myocardial infarction) [facility]) Active Problems:   Benign essential HTN   Chronic kidney disease (CKD), stage III (moderate) (HCC)    Hypercholesterolemia   Mild cognitive disorder   Polyneuropathy in diseases classified elsewhere ([place])   Mixed anxiety depressive disorder   Mild emphysema (HCC)   Lactic acidosis   Syncope   Elevated serum creatinine    ASSESSMENT AND PLAN:  Lori Gilmore is an 17yoF with a PMH of DM2, emphysema, HTN, HLD, renal cell carcinoma, history of TIA, cerebral thrombosis, cognitive impairment who presented to [facility] ED 08/16/2022 after a syncopal episode that occurred in bed after the patient came out of the bathroom after having a bowel movement.  Her initial primary complaint was dizziness and weakness immediately following the syncopal event, but apparently complained of left-sided chest pain later on.  The unassigned cardiologist Humphrey Rolls) was consulted with concern for for NSTEMI with elevated troponins (peak 4998) and left-sided chest discomfort.  She was scheduled for LHC the morning of 11/10.  During early morning hours of 11/10 nursing staff and cross cover noted "asystolic episodes" for which the patient was responsive to sternal rub.  These correlate with episodes of high-grade AV block for which interventional cardiology was consulted.  #High-grade AV block #Elevated troponin, demand supply ischemia vs NSTEMI Noted on telemetry early morning hours of 11/10 with 2 episodes of high-grade AV block >86 seconds, certainly concerning and likely the etiology of her syncopal episode and weakness that occurred prior to admission.  She is not on any AV nodal blockers that could be contributory, electrolytes within normal limits.  Discussed with the patient and her 2 daughters who are shared power of attorney the life-threatening and critical degree of this heart block for which a pacemaker is  recommended.  Discussed the risks and benefits of proceeding with traditional dual-chamber PPM versus Micra leadless PPM, the patient and her daughters are agreeable to proceeding with a Micra leadless pacemaker.  She  was initially planned for LHC and coronary angiography out of concern for NSTEMI with left-sided chest discomfort and elevated troponins with borderline abnormal EKG.  She and her daughters understand the the most emergent issue is her heart block, but Dr. Saralyn Pilar plans for coronary angiography while in the Cath Lab.  -continue aspirin -Avoid AV nodal blocking agents  -Please place defibrillator pads anteriorly and posteriorly -Check K+ and mag -Keep n.p.o. -She has an allergy to heparin (rash all over body, per daughters) -Patient and her daughters (POA) understand the risk, benefits, and alternatives to proceeding with permanent pacemaker implantation and left heart catheterization with coronary angiography possible intervention with Dr. Isaias Cowman they are are agreeable to proceed.  This patient's plan of care was discussed and created with Dr. Saralyn Pilar and he is in agreement.  Signed: Tristan Schroeder , PA-C Aug 10, 2022, 8:33 AM [facility]

## 2022-08-21 NOTE — Progress Notes (Addendum)
Patient had brief period of asystole post aspirin administration. No pulses felt radially or via carotid artery. Dr Saralyn Pilar at bedside. Patient placed on transcutaneous pacing - rate set to 60, mA set to 20. Patient returned back to organized rhythm. Pulses palpated both radial and carotid.

## 2022-08-21 NOTE — Discharge Summary (Signed)
Death Summary  Lori Gilmore YKD:[id number] DOB: 1934-03-12 DOA: 2022/08/17  Admit date: 08/17/2022 Date of Death: August 18, 2022 Time of Death: 13:10   History of present illness:  Lori Gilmore is a 88 y.o. female with medical history significant of normocytic anemia, osteoarthritis of the right knee, cerebral thrombosis, anxiety/depression, type 2 diabetes diet-controlled, headaches, hiatal hernia, mild emphysema, hyperlipidemia, hypertension, renal cell carcinoma, seasonal allergies, history of TIA who was brought to the emergency department via EMS after having a syncopal episode and passing out in bed for a few seconds shortly after coming out of the bathroom having a bowel movement.   After admission, pt was found to have frequent asystolic events associated with soft blood pressure, bradycardia and hypoxia.  Cardiology consulted and found pt to have high-grade AV block.  Pt also had trop elevation that peaked at 4990.  Pt was taken for left heart cath and placement of Micra AV leadless pacemaker.  Left heart cath found Mid LAD lesion is 60% stenosed, mild left ventricular systolic dysfunction with LVEF 45-50%.  During pacemaker procedure, pt went into pulseless electrical activity and expired.    Discharge Diagnoses:  Principal Problem:   AV block Active Problems:   Benign essential HTN   Stage 3b chronic kidney disease (CKD) (HCC)   Hypercholesterolemia   Mild cognitive disorder   Polyneuropathy in diseases classified elsewhere ([place])   Mixed anxiety depressive disorder   NSTEMI (non-ST elevation myocardial infarction) (HCC)   Mild emphysema (HCC)   Lactic acidosis   Syncope   Elevated serum creatinine   CAD (coronary artery disease), native coronary artery   Chronic systolic CHF (congestive heart failure) (HCC)   Pulseless electrical activity with heart block   Hypotension   Acute hypoxemic respiratory failure ([place])    The results of significant diagnostics from this  hospitalization (including imaging, microbiology, ancillary and laboratory) are listed below for reference.    Significant Diagnostic Studies: CARDIAC CATHETERIZATION  Result Date: 08-18-22   Mid LAD lesion is 60% stenosed.   There is mild left ventricular systolic dysfunction.   LV end diastolic pressure is mildly elevated.   The left ventricular ejection fraction is 45-50% by visual estimate. 1.  Elevated troponin 2.  High-grade AV block 3.  Nonobstructive coronary artery disease by cardiac catheterization 4.  Mildly reduced left ventricular function 5.  Temporary transvenous pacemaker 6.  Micra AV leadless pacemaker aborted 7.  Patient expired secondary to pulseless electrical activity during Micra AV leadless pacemaker procedure   EP PPM/ICD IMPLANT  Result Date: August 18, 2022   Mid LAD lesion is 60% stenosed.   There is mild left ventricular systolic dysfunction.   LV end diastolic pressure is mildly elevated.   The left ventricular ejection fraction is 45-50% by visual estimate. 1.  Elevated troponin 2.  High-grade AV block 3.  Nonobstructive coronary artery disease by cardiac catheterization 4.  Mildly reduced left ventricular function 5.  Temporary transvenous pacemaker 6.  Micra AV leadless pacemaker aborted 7.  Patient expired secondary to pulseless electrical activity during Micra AV leadless pacemaker procedure   DG Chest Portable 1 View  Result Date: 08/17/22 CLINICAL DATA:  Provided history: Chest pain. EXAM: PORTABLE CHEST 1 VIEW COMPARISON:  Prior chest radiographs 10/16/2020 and earlier. FINDINGS: Cardiac monitoring pads overlie the chest. Cardiomegaly. Aortic atherosclerosis. Moderate-sized hiatal hernia. No appreciable airspace consolidation or pulmonary edema. No evidence of pleural effusion or pneumothorax. Degenerative changes of the spine and left glenohumeral joint. IMPRESSION: No evidence of acute  cardiopulmonary abnormality. Cardiomegaly. Aortic Atherosclerosis  (ICD10-I70.0). Moderate-sized hiatal hernia. Electronically Signed   By: Kellie Simmering D.O.   On: 07/29/2022 14:22    Microbiology: Recent Results (from the past 240 hour(s))  Culture, blood (routine x 2)     Status: None (Preliminary result)   Collection Time: 08/01/2022  1:30 PM   Specimen: BLOOD  Result Value Ref Range Status   Specimen Description BLOOD BLOOD RIGHT HAND  Final   Special Requests   Final    BOTTLES DRAWN AEROBIC AND ANAEROBIC Blood Culture adequate volume   Culture   Final    NO GROWTH < 24 HOURS Performed at [facility], [address], [place], [place] [address]    Report Status PENDING  Incomplete  Culture, blood (routine x 2)     Status: None (Preliminary result)   Collection Time: 08/04/2022  1:30 PM   Specimen: BLOOD  Result Value Ref Range Status   Specimen Description BLOOD BLOOD LEFT HAND  Final   Special Requests   Final    BOTTLES DRAWN AEROBIC AND ANAEROBIC Blood Culture adequate volume   Culture   Final    NO GROWTH < 24 HOURS Performed at [facility], [address] [place], [place] [address]    Report Status PENDING  Incomplete  MRSA Next Gen by PCR, Nasal     Status: None   Collection Time: 08/16/2022  4:00 PM   Specimen: Nasal Mucosa; Nasal Swab  Result Value Ref Range Status   MRSA by PCR Next Gen NOT DETECTED NOT DETECTED Final    Comment: (NOTE) The GeneXpert MRSA Assay (FDA approved for NASAL specimens only), is one component of a comprehensive MRSA colonization surveillance program. It is not intended to diagnose MRSA infection nor to guide or monitor treatment for MRSA infections. Test performance is not FDA approved in patients less than 58 years old. Performed at [facility], [address] [place], [place] [address]      Labs: Basic Metabolic Panel: Recent Labs  Lab 08/18/2022 1352 2022-08-22 0439  NA 140 139  K 4.7 4.4  CL 108 113*  CO2 24 21*  GLUCOSE 158* 239*  BUN 39* 41*  CREATININE  1.21* 1.31*  CALCIUM 9.1 8.3*  MG  --  1.7   Liver Function Tests: Recent Labs  Lab 07/29/2022 1352 August 22, 2022 0439  AST 30 56*  ALT 8 18  ALKPHOS 45 46  BILITOT 0.6 1.0  PROT 6.3* 5.5*  ALBUMIN 3.3* 2.9*   Recent Labs  Lab 08/07/2022 1352  LIPASE 38   No results for input(s): "AMMONIA" in the last 168 hours. CBC: Recent Labs  Lab 08/06/2022 1330 22-Aug-2022 0439  WBC 6.3 12.9*  NEUTROABS 3.9  --   HGB 10.6* 9.7*  HCT 33.3* 29.6*  MCV 94.9 93.4  PLT 294 252   Cardiac Enzymes: No results for input(s): "CKTOTAL", "CKMB", "CKMBINDEX", "TROPONINI" in the last 168 hours. D-Dimer No results for input(s): "DDIMER" in the last 72 hours. BNP: Invalid input(s): "POCBNP" CBG: Recent Labs  Lab 08/17/2022 1542 08/15/2022 2154 2022-08-22 0325 08-22-22 0750 08/22/22 0904  GLUCAP 114* 150* 232* 211* 175*   Anemia work up No results for input(s): "VITAMINB12", "FOLATE", "FERRITIN", "TIBC", "IRON", "RETICCTPCT" in the last 72 hours. Urinalysis    Component Value Date/Time   COLORURINE YELLOW 04/08/2021 1023   APPEARANCEUR HAZY (A) 04/08/2021 1023   APPEARANCEUR Clear 05/07/2014 1258   LABSPEC 1.015 04/08/2021 1023   LABSPEC 1.014 05/07/2014  [place] 7.0 04/08/2021 1023   GLUCOSEU NEGATIVE 04/08/2021 1023   GLUCOSEU Negative 05/07/2014 1258   HGBUR TRACE (A) 04/08/2021 [place] 04/08/2021 1023   BILIRUBINUR Negative 05/07/2014 [place] 04/08/2021 1023   PROTEINUR NEGATIVE 04/08/2021 1023   NITRITE NEGATIVE 04/08/2021 1023   LEUKOCYTESUR MODERATE (A) 04/08/2021 1023   LEUKOCYTESUR Negative 05/07/2014 1258   Sepsis Labs Recent Labs  Lab 07/26/2022 1330 August 18, 2022 0439  WBC 6.3 12.9*    SIGNED:  Enzo Bi, MD  [facility] 08-18-2022, 6:08 PM

## 2022-08-21 NOTE — Progress Notes (Signed)
   18-Aug-2022 1400  Clinical Encounter Type  Visited With Patient and family together  Visit Type Initial  Referral From Nurse  Consult/Referral To Chaplain   Chaplain responded to nurse page. Chaplain provided compassionate presence and prayer amid patient's death. Chaplain spoke with 3 sisters and provided support during their grieving process. Chaplain services are available for follow up as needed.

## 2022-08-21 NOTE — Progress Notes (Signed)
SUBJECTIVE: Patient had another episode of syncope associated with multiple asystole events   Vitals:   August 29, 2022 0649 August 29, 2022 0700 08-29-22 0800 08/29/22 0840  BP: (!) 87/52 (!) 92/52 (!) 92/55 (!) 93/50  Pulse: 73 72 72 71  Resp: (!) 21 (!) 22 (!) 23 16  Temp:    97.9 F (36.6 C)  TempSrc:    Axillary  SpO2: 98% 100% 98% 97%  Weight:      Height:        Intake/Output Summary (Last 24 hours) at 2022-08-29 0904 Last data filed at 08-29-22 1497 Gross per 24 hour  Intake 1695.43 ml  Output 175 ml  Net 1520.43 ml    LABS: Basic Metabolic Panel: Recent Labs    08/16/2022 1352 08/29/2022 0439  NA 140 139  K 4.7 4.4  CL 108 113*  CO2 24 21*  GLUCOSE 158* 239*  BUN 39* 41*  CREATININE 1.21* 1.31*  CALCIUM 9.1 8.3*  MG  --  1.7   Liver Function Tests: Recent Labs    08/13/2022 1352 29-Aug-2022 0439  AST 30 56*  ALT 8 18  ALKPHOS 45 46  BILITOT 0.6 1.0  PROT 6.3* 5.5*  ALBUMIN 3.3* 2.9*   Recent Labs    08/16/2022 1352  LIPASE 38   CBC: Recent Labs    08/13/2022 1330 08/29/22 0439  WBC 6.3 12.9*  NEUTROABS 3.9  --   HGB 10.6* 9.7*  HCT 33.3* 29.6*  MCV 94.9 93.4  PLT 294 252   Cardiac Enzymes: No results for input(s): "CKTOTAL", "CKMB", "CKMBINDEX", "TROPONINI" in the last 72 hours. BNP: Invalid input(s): "POCBNP" D-Dimer: No results for input(s): "DDIMER" in the last 72 hours. Hemoglobin A1C: No results for input(s): "HGBA1C" in the last 72 hours. Fasting Lipid Panel: Recent Labs    2022/08/29 0439  CHOL 143  HDL 50  LDLCALC 83  TRIG 48  CHOLHDL 2.9   Thyroid Function Tests: No results for input(s): "TSH", "T4TOTAL", "T3FREE", "THYROIDAB" in the last 72 hours.  Invalid input(s): "FREET3" Anemia Panel: No results for input(s): "VITAMINB12", "FOLATE", "FERRITIN", "TIBC", "IRON", "RETICCTPCT" in the last 72 hours.   PHYSICAL EXAM General: Well developed, well nourished, in no acute distress HEENT:  Normocephalic and atramatic Neck:  No JVD.   Lungs: Clear bilaterally to auscultation and percussion. Heart: HRRR . Normal S1 and S2 without gallops or murmurs.  Abdomen: Bowel sounds are positive, abdomen soft and non-tender  Msk:  Back normal, normal gait. Normal strength and tone for age. Extremities: No clubbing, cyanosis or edema.   Neuro: Alert and oriented X 3. Psych:  Good affect, responds appropriately  TELEMETRY: Sinus rhythm with occasional asystole  ASSESSMENT AND PLAN: Non-STEMI with chest pain yesterday associated with syncope.  This morning had episodes of asystole/pauses.  Patient is being evaluated by Dr. Donette Larry to do leadless pacemaker and left heart catheterization today.  Principal Problem:   NSTEMI (non-ST elevation myocardial infarction) ([place]) Active Problems:   Benign essential HTN   Chronic kidney disease (CKD), stage III (moderate) (HCC)   Hypercholesterolemia   Mild cognitive disorder   Polyneuropathy in diseases classified elsewhere [facility])   Mixed anxiety depressive disorder   Mild emphysema (HCC)   Lactic acidosis   Syncope   Elevated serum creatinine    Lori Gilmore A, MD, [facility] 08-29-2022 9:04 AM

## 2022-08-21 NOTE — Progress Notes (Signed)
       CROSS COVER NOTE  NAME: Lori Gilmore MRN: [id number] DOB : 04/03/1934    Time of Service   0300  HPI/Events of Note   Brief asystolic event reported by nurse after getting back to bed from going to the bathroom    Assessment and  Interventions   Assessment: Arrived to bedside patient with shallow respirations soft pressures and bradycardic She did respond to sternal rub.  Once awake heart rate in 100s and blood pressures greatly improved.  Neuro assessment completely in tact. Sats 88% even with increase 2 to 4 L Obligatory mouth breather, utilize [place] to mouth or NRB CBG 232 Plan: Titrate oxygen keep sats above 92% ABG  Continue to monitor       Kathlene Cote NP [facility]

## 2022-08-21 NOTE — Inpatient Diabetes Management (Signed)
Inpatient Diabetes Program Recommendations  AACE/ADA: New Consensus Statement on Inpatient Glycemic Control (2015)  Target Ranges:  Prepandial:   less than 140 mg/dL      Peak postprandial:   less than 180 mg/dL (1-2 hours)      Critically ill patients:  140 - 180 mg/dL   Lab Results  Component Value Date   GLUCAP 175 (H) 08-28-2022   HGBA1C 5.8 (H) 08-28-22    Review of Glycemic Control  Diabetes history: DM2 Outpatient Diabetes medications: Tradjenta 5 mg Current orders for Inpatient glycemic control: Tradjenta 5 mg qd, Novolog 0-9 units tid correction  Inpatient Diabetes Program Recommendations:   Currently in cath lab for pacemaker placement. Noted A1c 5.8 and age 88. May evaluate need for Tradjenta @ discharge, and follow up with PCP post discharge.  Thank you, Nani Gasser. Kiam Bransfield, RN, MSN, CDE  Diabetes Coordinator Inpatient Glycemic Control Team Team Pager [phone] (8am-5pm) 08/28/22 11:12 AM

## 2022-08-21 DEATH — deceased

## 2022-10-12 ENCOUNTER — Ambulatory Visit: Payer: Medicare Other | Admitting: Urology
